# Patient Record
Sex: Male | Born: 1950 | Race: Black or African American | Hispanic: No | Marital: Single | Smoking: Current some day smoker
Health system: Southern US, Community
[De-identification: ages and names within clinical notes are randomized; demographics above are authoritative.]

## PROBLEM LIST (undated history)

## (undated) DIAGNOSIS — G4730 Sleep apnea, unspecified: Secondary | ICD-10-CM

## (undated) DIAGNOSIS — N189 Chronic kidney disease, unspecified: Secondary | ICD-10-CM

## (undated) DIAGNOSIS — K219 Gastro-esophageal reflux disease without esophagitis: Secondary | ICD-10-CM

## (undated) DIAGNOSIS — IMO0002 Reserved for concepts with insufficient information to code with codable children: Secondary | ICD-10-CM

## (undated) DIAGNOSIS — R0600 Dyspnea, unspecified: Secondary | ICD-10-CM

## (undated) DIAGNOSIS — M1990 Unspecified osteoarthritis, unspecified site: Secondary | ICD-10-CM

## (undated) DIAGNOSIS — D649 Anemia, unspecified: Secondary | ICD-10-CM

## (undated) DIAGNOSIS — E785 Hyperlipidemia, unspecified: Secondary | ICD-10-CM

## (undated) DIAGNOSIS — I10 Essential (primary) hypertension: Secondary | ICD-10-CM

## (undated) DIAGNOSIS — Z5189 Encounter for other specified aftercare: Secondary | ICD-10-CM

## (undated) HISTORY — DX: Essential (primary) hypertension: I10

## (undated) HISTORY — DX: Chronic kidney disease, unspecified: N18.9

## (undated) HISTORY — DX: Reserved for concepts with insufficient information to code with codable children: IMO0002

## (undated) HISTORY — DX: Gastro-esophageal reflux disease without esophagitis: K21.9

## (undated) HISTORY — PX: WISDOM TOOTH EXTRACTION: SHX21

## (undated) HISTORY — PX: TONSILLECTOMY: SUR1361

## (undated) HISTORY — DX: Encounter for other specified aftercare: Z51.89

## (undated) HISTORY — PX: COLONOSCOPY: SHX174

---

## 1972-01-01 HISTORY — PX: INGUINAL HERNIA REPAIR: SUR1180

## 1988-01-01 DIAGNOSIS — Z5189 Encounter for other specified aftercare: Secondary | ICD-10-CM

## 1988-01-01 DIAGNOSIS — IMO0001 Reserved for inherently not codable concepts without codable children: Secondary | ICD-10-CM

## 1988-01-01 DIAGNOSIS — IMO0002 Reserved for concepts with insufficient information to code with codable children: Secondary | ICD-10-CM

## 1988-01-01 HISTORY — DX: Reserved for inherently not codable concepts without codable children: IMO0001

## 1988-01-01 HISTORY — DX: Reserved for concepts with insufficient information to code with codable children: IMO0002

## 1988-01-01 HISTORY — DX: Encounter for other specified aftercare: Z51.89

## 2000-10-01 ENCOUNTER — Encounter: Admission: RE | Admit: 2000-10-01 | Discharge: 2000-10-01 | Payer: Self-pay | Admitting: Family Medicine

## 2000-10-01 ENCOUNTER — Encounter: Payer: Self-pay | Admitting: Family Medicine

## 2001-03-27 ENCOUNTER — Encounter: Payer: Self-pay | Admitting: Family Medicine

## 2001-03-27 ENCOUNTER — Ambulatory Visit (HOSPITAL_COMMUNITY): Admission: RE | Admit: 2001-03-27 | Discharge: 2001-03-27 | Payer: Self-pay | Admitting: Family Medicine

## 2001-06-19 ENCOUNTER — Encounter: Admission: RE | Admit: 2001-06-19 | Discharge: 2001-06-19 | Payer: Self-pay | Admitting: Family Medicine

## 2001-06-19 ENCOUNTER — Encounter: Payer: Self-pay | Admitting: Family Medicine

## 2003-03-11 ENCOUNTER — Encounter: Admission: RE | Admit: 2003-03-11 | Discharge: 2003-03-11 | Payer: Self-pay | Admitting: Family Medicine

## 2003-03-11 ENCOUNTER — Encounter: Payer: Self-pay | Admitting: Family Medicine

## 2004-09-05 ENCOUNTER — Emergency Department (HOSPITAL_COMMUNITY): Admission: EM | Admit: 2004-09-05 | Discharge: 2004-09-05 | Payer: Self-pay

## 2004-09-11 ENCOUNTER — Emergency Department (HOSPITAL_COMMUNITY): Admission: EM | Admit: 2004-09-11 | Discharge: 2004-09-11 | Payer: Self-pay | Admitting: Emergency Medicine

## 2006-05-29 ENCOUNTER — Ambulatory Visit: Payer: Self-pay | Admitting: Gastroenterology

## 2006-06-12 ENCOUNTER — Ambulatory Visit: Payer: Self-pay | Admitting: Gastroenterology

## 2006-06-12 ENCOUNTER — Encounter (INDEPENDENT_AMBULATORY_CARE_PROVIDER_SITE_OTHER): Payer: Self-pay | Admitting: Specialist

## 2009-02-18 ENCOUNTER — Encounter: Admission: RE | Admit: 2009-02-18 | Discharge: 2009-02-18 | Payer: Self-pay | Admitting: Family Medicine

## 2011-06-12 ENCOUNTER — Other Ambulatory Visit: Payer: Self-pay | Admitting: Family Medicine

## 2011-06-12 ENCOUNTER — Ambulatory Visit
Admission: RE | Admit: 2011-06-12 | Discharge: 2011-06-12 | Disposition: A | Discharge status: Home / Self Care | Payer: BC Managed Care – PPO | Source: Ambulatory Visit | Attending: Family Medicine | Admitting: Family Medicine

## 2011-06-12 DIAGNOSIS — J209 Acute bronchitis, unspecified: Secondary | ICD-10-CM

## 2011-07-16 ENCOUNTER — Encounter: Payer: Self-pay | Admitting: Gastroenterology

## 2011-08-17 ENCOUNTER — Ambulatory Visit (AMBULATORY_SURGERY_CENTER): Payer: BC Managed Care – PPO | Admitting: *Deleted

## 2011-08-17 ENCOUNTER — Encounter: Payer: Self-pay | Admitting: Gastroenterology

## 2011-08-17 DIAGNOSIS — Z1211 Encounter for screening for malignant neoplasm of colon: Secondary | ICD-10-CM

## 2011-08-17 DIAGNOSIS — Z86010 Personal history of colonic polyps: Secondary | ICD-10-CM

## 2011-08-17 MED ORDER — PEG-KCL-NACL-NASULF-NA ASC-C 100 G PO SOLR: ORAL | Status: DC

## 2011-08-31 ENCOUNTER — Ambulatory Visit (AMBULATORY_SURGERY_CENTER): Payer: BC Managed Care – PPO | Admitting: Gastroenterology

## 2011-08-31 ENCOUNTER — Encounter: Payer: Self-pay | Admitting: Gastroenterology

## 2011-08-31 DIAGNOSIS — Z86010 Personal history of colonic polyps: Secondary | ICD-10-CM

## 2011-08-31 DIAGNOSIS — Z1211 Encounter for screening for malignant neoplasm of colon: Secondary | ICD-10-CM

## 2011-08-31 MED ORDER — SODIUM CHLORIDE 0.9 % IV SOLN: 500.0000 mL | INTRAVENOUS | Status: DC

## 2011-09-04 ENCOUNTER — Telehealth: Payer: Self-pay | Admitting: *Deleted

## 2011-09-04 NOTE — Telephone Encounter (Signed)

## 2012-02-05 ENCOUNTER — Other Ambulatory Visit: Payer: Self-pay | Admitting: Family Medicine

## 2012-02-05 DIAGNOSIS — R252 Cramp and spasm: Secondary | ICD-10-CM

## 2012-02-08 ENCOUNTER — Ambulatory Visit
Admission: RE | Admit: 2012-02-08 | Discharge: 2012-02-08 | Disposition: A | Discharge status: Home / Self Care | Payer: BC Managed Care – PPO | Source: Ambulatory Visit | Attending: Family Medicine | Admitting: Family Medicine

## 2012-02-08 DIAGNOSIS — R252 Cramp and spasm: Secondary | ICD-10-CM

## 2014-08-05 ENCOUNTER — Encounter: Payer: Self-pay | Admitting: Gastroenterology

## 2018-07-11 ENCOUNTER — Other Ambulatory Visit: Payer: Self-pay | Admitting: Nephrology

## 2018-07-11 DIAGNOSIS — N183 Chronic kidney disease, stage 3 unspecified: Secondary | ICD-10-CM

## 2018-07-11 DIAGNOSIS — N179 Acute kidney failure, unspecified: Secondary | ICD-10-CM

## 2018-08-08 ENCOUNTER — Ambulatory Visit
Admission: RE | Admit: 2018-08-08 | Discharge: 2018-08-08 | Disposition: A | Discharge status: Home / Self Care | Payer: BC Managed Care – PPO | Source: Ambulatory Visit | Attending: Nephrology | Admitting: Nephrology

## 2018-08-08 DIAGNOSIS — N179 Acute kidney failure, unspecified: Secondary | ICD-10-CM

## 2018-08-08 DIAGNOSIS — N183 Chronic kidney disease, stage 3 unspecified: Secondary | ICD-10-CM

## 2018-12-01 ENCOUNTER — Encounter: Payer: Self-pay | Admitting: Nurse Practitioner

## 2018-12-01 ENCOUNTER — Ambulatory Visit (INDEPENDENT_AMBULATORY_CARE_PROVIDER_SITE_OTHER): Payer: BC Managed Care – PPO | Admitting: Nurse Practitioner

## 2018-12-01 VITALS — BP 138/100 | HR 87 | Temp 98.6°F | Ht 71.5 in | Wt 202.0 lb

## 2018-12-01 DIAGNOSIS — Z23 Encounter for immunization: Secondary | ICD-10-CM

## 2018-12-01 DIAGNOSIS — M25562 Pain in left knee: Secondary | ICD-10-CM

## 2018-12-01 DIAGNOSIS — Z139 Encounter for screening, unspecified: Secondary | ICD-10-CM

## 2018-12-01 DIAGNOSIS — N182 Chronic kidney disease, stage 2 (mild): Secondary | ICD-10-CM | POA: Diagnosis not present

## 2018-12-01 DIAGNOSIS — R351 Nocturia: Secondary | ICD-10-CM

## 2018-12-01 DIAGNOSIS — I129 Hypertensive chronic kidney disease with stage 1 through stage 4 chronic kidney disease, or unspecified chronic kidney disease: Secondary | ICD-10-CM | POA: Diagnosis not present

## 2018-12-01 DIAGNOSIS — I10 Essential (primary) hypertension: Secondary | ICD-10-CM

## 2018-12-01 DIAGNOSIS — Z0000 Encounter for general adult medical examination without abnormal findings: Secondary | ICD-10-CM

## 2018-12-01 LAB — POCT URINALYSIS DIPSTICK
Bilirubin, UA: NEGATIVE
Glucose, UA: NEGATIVE
Ketones, UA: NEGATIVE
Leukocytes, UA: NEGATIVE
Nitrite, UA: NEGATIVE
Protein, UA: POSITIVE — AB
Spec Grav, UA: 1.02 (ref 1.010–1.025)
Urobilinogen, UA: 0.2 E.U./dL
pH, UA: 6 (ref 5.0–8.0)

## 2018-12-01 LAB — POCT UA - MICROALBUMIN
Albumin/Creatinine Ratio, Urine, POC: 300
Creatinine, POC: 200 mg/dL
Microalbumin Ur, POC: 150 mg/L

## 2018-12-01 MED ORDER — TETANUS-DIPHTHERIA TOXOIDS TD 2-2 LF/0.5ML IM SUSP: 0.5000 mL | Freq: Once | INTRAMUSCULAR | 0 refills | Status: AC

## 2018-12-01 NOTE — Progress Notes (Signed)
Subjective:     Patient ID: Ryan Salazar , male    DOB: 1951/11/10 , 67 y.o.   MRN: [id number]   Chief Complaint  Patient presents with  . Annual Exam    Men's preventive visit. Patient Health Questionnaire (PHQ-2) is    Office Visit from 12/01/2018 in [facility]  PHQ-2 Total Score  0    . Patient is on a regular diet. Marital status: Married. Relevant history for alcohol use is:  Social History   Substance and Sexual Activity  Alcohol Use Yes   Comment: "DRINKS ALCOHOL & SMOKE POT ON WEEKENDS"  . Relevant history for tobacco use is:  Social History   Tobacco Use  Smoking Status Current Some Day Smoker  . Types: Cigarettes  Smokeless Tobacco Never Used  Tobacco Comment   1 PACK  EVERY 3 DAYS  .  HPI  Here for physical and medicare annual wellness exam  Knee Pain   The incident occurred more than 1 week ago. The incident occurred at home. The injury mechanism is unknown. The pain is present in the left knee. The quality of the pain is described as stabbing. The pain is at a severity of 8/10. The pain is moderate. The pain has been intermittent since onset. Pertinent negatives include no numbness or tingling. Nothing aggravates the symptoms. Treatments tried: knee brace.     Past Medical History:  Diagnosis Date  . Blood transfusion 1989  . GERD (gastroesophageal reflux disease)   . Hypertension   . Ulcer 1989   BLEEDING STOMACH ULCER     Family History  Problem Relation Age of Onset  . Colon cancer Neg Hx   . Colon polyps Neg Hx      Current Outpatient Medications:  .  amLODipine (NORVASC) 5 MG tablet, Take 1 tablet by mouth Daily. , Disp: , Rfl:  .  calcitRIOL (ROCALTROL) 0.25 MCG capsule, Take 0.25 mcg by mouth daily., Disp: , Rfl:  .  doxazosin (CARDURA) 4 MG tablet, Take 4 mg by mouth 2 (two) times daily., Disp: , Rfl:  .  furosemide (LASIX) 80 MG tablet, Take 1 tablet by mouth 2 (two) times daily. , Disp: , Rfl:  .  KLOR-CON M20  20 MEQ tablet, Take 1 tablet by mouth Daily., Disp: , Rfl:  .  labetalol (NORMODYNE) 300 MG tablet, Take 1 tablet by mouth Twice daily., Disp: , Rfl:  .  Multiple Vitamin (MULTIVITAMIN) tablet, Take 1 tablet by mouth daily.  , Disp: , Rfl:  .  omeprazole (PRILOSEC) 20 MG capsule, Take 1 tablet by mouth Twice daily., Disp: , Rfl:    No Known Allergies   Review of Systems  Constitutional: Negative.   HENT: Negative.   Eyes: Negative.   Respiratory: Negative.   Cardiovascular: Negative.   Gastrointestinal: Negative.   Endocrine: Negative.   Genitourinary: Negative.   Musculoskeletal: Negative.        Left knee pain   Skin: Negative.   Allergic/Immunologic: Negative.   Neurological: Negative.  Negative for tingling and numbness.  Hematological: Negative.   Psychiatric/Behavioral: Negative.      Today's Vitals   12/01/18 1453  BP: (!) 138/100  Pulse: 87  Temp: 98.6 F (37 C)  TempSrc: Oral  SpO2: 98%  Weight: 202 lb (91.6 kg)  Height: 5' 11.5" (1.816 m)  PainSc: 0-No pain   Body mass index is 27.78 kg/m.   Objective:  Physical Exam  Constitutional: He is oriented to person,  place, and time. He appears well-developed and well-nourished.  HENT:  Head: Normocephalic and atraumatic.  Right Ear: External ear normal.  Left Ear: External ear normal.  Nose: Nose normal.  Mouth/Throat: Oropharynx is clear and moist.  Eyes: Pupils are equal, round, and reactive to light. Conjunctivae and EOM are normal.  Neck: Normal range of motion. Neck supple.  Cardiovascular: Normal rate, regular rhythm, normal heart sounds and intact distal pulses.  Pulmonary/Chest: Effort normal and breath sounds normal.  Abdominal: Bowel sounds are normal.  Musculoskeletal: Normal range of motion.  Neurological: He is alert and oriented to person, place, and time.  Skin: Skin is warm and dry. Capillary refill takes less than 2 seconds.  Psychiatric: He has a normal mood and affect.  Vitals  reviewed.       Assessment And Plan:     1. Medicare annual wellness visit, initial . Behavior modifications discussed and diet history reviewed.   . Pt will continue to exercise regularly and modify diet with low GI, plant based foods and decrease intake of processed foods.  . Recommend intake of daily multivitamin, Vitamin D, and calcium.  . Recommend mammogram and colonoscopy for preventive screenings, as well as recommend immunizations that include influenza, TDAP, and Shingles  2. Essential hypertension . B/P is poorly controlled, has had a recent change to his medications by Cardiology.  . CMP ordered to check renal function.  . The importance of regular exercise and dietary modification was stressed to the patient.  - CBC no Diff - CMP14 + Anion Gap - Lipid panel - POCT Urinalysis Dipstick (81002) - POCT UA - Microalbumin  3. Acute pain of left knee  Mild tenderness to left knee medial aspect  Negative drawer test  Will check knee xray - DG Knee Complete 4 Views Right; Future  4. Stage 2 chronic kidney disease  Being followed by Nephrology  5. Encounter for screening  Will check for Hepatitis C screening due to being born between the years [birth year]-1965 - Hepatitis C antibody  6. Nocturia  Will check PSA level today, negative manual prostate exam - PSA  7. Encounter for immunization - Pneumococcal 23 - diptheria-tetanus toxoids (DECAVAC) 2-2 LF/0.5ML injection; Inject 0.5 mLs into the muscle once for 1 dose.  Dispense: 0.5 mL; Refill: 0    [name], FNP    Subjective:   Ryan Salazar is a 67 y.o. male who presents for a Welcome to Medicare exam.   Review of Systems: See above Cardiac Risk Factors include: advanced age (>65men, >40 women)     Objective:    Today's Vitals   12/01/18 1453  BP: (!) 138/100  Pulse: 87  Temp: 98.6 F (37 C)  TempSrc: Oral  SpO2: 98%  Weight: 202 lb (91.6 kg)  Height: 5' 11.5" (1.816 m)  PainSc: 0-No pain    Body mass index is 27.78 kg/m.  Medications Outpatient Encounter Medications as of 12/01/2018  Medication Sig  . amLODipine (NORVASC) 5 MG tablet Take 1 tablet by mouth Daily.   . calcitRIOL (ROCALTROL) 0.25 MCG capsule Take 0.25 mcg by mouth daily.  Marland Kitchen doxazosin (CARDURA) 4 MG tablet Take 4 mg by mouth 2 (two) times daily.  . furosemide (LASIX) 80 MG tablet Take 1 tablet by mouth 2 (two) times daily.   Marland Kitchen KLOR-CON M20 20 MEQ tablet Take 1 tablet by mouth Daily.  Marland Kitchen labetalol (NORMODYNE) 300 MG tablet Take 1 tablet by mouth Twice daily.  . Multiple Vitamin (MULTIVITAMIN) tablet  Take 1 tablet by mouth daily.    Marland Kitchen omeprazole (PRILOSEC) 20 MG capsule Take 1 tablet by mouth Twice daily.  . [DISCONTINUED] enalapril (VASOTEC) 10 MG tablet Take 1 tablet by mouth Twice daily.  . [DISCONTINUED] Tamsulosin HCl (FLOMAX) 0.4 MG CAPS Take 1 tablet by mouth Daily.   No facility-administered encounter medications on file as of 12/01/2018.      History: Past Medical History:  Diagnosis Date  . Blood transfusion 1989  . GERD (gastroesophageal reflux disease)   . Hypertension   . Ulcer 1989   BLEEDING STOMACH ULCER   Past Surgical History:  Procedure Laterality Date  . [place]  . TONSILLECTOMY  AS CHILD    Family History  Problem Relation Age of Onset  . Colon cancer Neg Hx   . Colon polyps Neg Hx    Social History   Occupational History  . Not on file  Tobacco Use  . Smoking status: Current Some Day Smoker    Types: Cigarettes  . Smokeless tobacco: Never Used  . Tobacco comment: 1 PACK  EVERY 3 DAYS  Substance and Sexual Activity  . Alcohol use: Yes    Comment: "DRINKS ALCOHOL & SMOKE POT ON WEEKENDS"  . Drug use: Yes    Types: Marijuana  . Sexual activity: Not on file   Tobacco Counseling Ready to quit: Not Answered Counseling given: Not Answered Comment: 1 PACK  EVERY 3 DAYS   Immunizations and Health Maintenance Immunization History  Administered  Date(s) Administered  . Influenza-Unspecified 09/01/2018   Health Maintenance Due  Topic Date Due  . Hepatitis C Screening  Dec 25, 1951  . TETANUS/TDAP  12/07/1970  . PNA vac Low Risk Adult (1 of 2 - PCV13) 12/07/2016  . INFLUENZA VACCINE  07/31/2018    Activities of Daily Living In your present state of health, do you have any difficulty performing the following activities: 12/01/2018  Hearing? N  Vision? Y  Difficulty concentrating or making decisions? N  Walking or climbing stairs? N  Dressing or bathing? N  Doing errands, shopping? N  Preparing Food and eating ? N  Using the Toilet? N  In the past six months, have you accidently leaked urine? N  Do you have problems with loss of bowel control? N  Managing your Medications? N  Managing your Finances? N  Housekeeping or managing your Housekeeping? N  Some recent data might be hidden    Physical Exam  See above, (optional), or other factors deemed appropriate based on the beneficiary's medical and social history and current clinical standards.  Advanced Directives: Does Patient Have a Medical Advance Directive?: No Would patient like information on creating a medical advance directive?: Yes (MAU/Ambulatory/Procedural Areas - Information given)    Assessment:    This is a routine wellness  examination for this patient Ryan Salazar.  Vision/Hearing screen No exam data present  Dietary issues and exercise activities discussed:  Current Exercise Habits: Home exercise routine, Time (Minutes): 30, Frequency (Times/Week): 3, Weekly Exercise (Minutes/Week): 90, Intensity: Moderate, Exercise limited by: None identified  Goals    . Exercise 3x per week (30 min per time)     Would like to exercise about 5 days a week.        Depression Screen PHQ 2/9 Scores 12/01/2018  PHQ - 2 Score 0     Fall Risk Fall Risk  12/01/2018  Falls in the past year? 1  Number falls in past yr: 0  Injury  with Fall? 0    Cognitive Function          Patient Care Team: [name], FNP as PCP - General (General Practice)     Plan:    See above  I have personally reviewed and noted the following in the patient's chart:   . Medical and social history . Use of alcohol, tobacco or illicit drugs  . Current medications and supplements . Functional ability and status . Nutritional status . Physical activity . Advanced directives . List of other physicians . Hospitalizations, surgeries, and ER visits in previous 12 months . Vitals . Screenings to include cognitive, depression, and falls . Referrals and appointments  In addition, I have reviewed and discussed with patient certain preventive protocols, quality metrics, and best practice recommendations. A written personalized care plan for preventive services as well as general preventive health recommendations were provided to patient.    [name], FNP 12/01/2018

## 2018-12-01 NOTE — Patient Instructions (Signed)
Advance Directive Advance directives are legal documents that let you make choices ahead of time about your health care and medical treatment in case you become unable to communicate for yourself. Advance directives are a way for you to communicate your wishes to family, friends, and health care providers. This can help convey your decisions about end-of-life care if you become unable to communicate. Discussing and writing advance directives should happen over time rather than all at once. Advance directives can be changed depending on your situation and what you want, even after you have signed the advance directives. If you do not have an advance directive, some states assign family decision makers to act on your behalf based on how closely you are related to them. Each state has its own laws regarding advance directives. You may want to check with your health care provider, attorney, or state representative about the laws in your state. There are different types of advance directives, such as:  Medical power of attorney.  Living will.  Do not resuscitate (DNR) or do not attempt resuscitation (DNAR) order.  Health care proxy and medical power of attorney A health care proxy, also called a health care agent, is a person who is appointed to make medical decisions for you in cases in which you are unable to make the decisions yourself. Generally, people choose someone they know well and trust to represent their preferences. Make sure to ask this person for an agreement to act as your proxy. A proxy may have to exercise judgment in the event of a medical decision for which your wishes are not known. A medical power of attorney is a legal document that names your health care proxy. Depending on the laws in your state, after the document is written, it may also need to be:  Signed.  Notarized.  Dated.  Copied.  Witnessed.  Incorporated into your medical record.  You may also want to appoint  someone to manage your financial affairs in a situation in which you are unable to do so. This is called a durable power of attorney for finances. It is a separate legal document from the durable power of attorney for health care. You may choose the same person or someone different from your health care proxy to act as your agent in financial matters. If you do not appoint a proxy, or if there is a concern that the proxy is not acting in your best interests, a court-appointed guardian may be designated to act on your behalf. Living will A living will is a set of instructions documenting your wishes about medical care when you cannot express them yourself. Health care providers should keep a copy of your living will in your medical record. You may want to give a copy to family members or friends. To alert caregivers in case of an emergency, you can place a card in your wallet to let them know that you have a living will and where they can find it. A living will is used if you become:  Terminally ill.  Incapacitated.  Unable to communicate or make decisions.  Items to consider in your living will include:  The use or non-use of life-sustaining equipment, such as dialysis machines and breathing machines (ventilators).  A DNR or DNAR order, which is the instruction not to use cardiopulmonary resuscitation (CPR) if breathing or heartbeat stops.  The use or non-use of tube feeding.  Withholding of food and fluids.  Comfort (palliative) care when the goal becomes  comfort rather than a cure.  Organ and tissue donation.  A living will does not give instructions for distributing your money and property if you should pass away. It is recommended that you seek the advice of a lawyer when writing a will. Decisions about taxes, beneficiaries, and asset distribution will be legally binding. This process can relieve your family and friends of any concerns surrounding disputes or questions that may come up  about the distribution of your assets. DNR or DNAR A DNR or DNAR order is a request not to have CPR in the event that your heart stops beating or you stop breathing. If a DNR or DNAR order has not been made and shared, a health care provider will try to help any patient whose heart has stopped or who has stopped breathing. If you plan to have surgery, talk with your health care provider about how your DNR or DNAR order will be followed if problems occur. Summary  Advance directives are the legal documents that allow you to make choices ahead of time about your health care and medical treatment in case you become unable to communicate for yourself.  The process of discussing and writing advance directives should happen over time. You can change the advance directives, even after you have signed them.  Advance directives include DNR or DNAR orders, living wills, and designating an agent as your medical power of attorney. This information is not intended to replace advice given to you by your health care provider. Make sure you discuss any questions you have with your health care provider. Document Released: 03/25/2008 Document Revised: 11/05/2016 Document Reviewed: 11/05/2016 Elsevier Interactive Patient Education  2017 [employer].  Ryan Salazar , Thank you for taking time to come for your Medicare Wellness Visit. I appreciate your ongoing commitment to your health goals. Please review the following plan we discussed and let me know if I can assist you in the future.   These are the goals we discussed: Goals    . Exercise 3x per week (30 min per time)     Would like to exercise about 5 days a week.        This is a list of the screening recommended for you and due dates:  Health Maintenance  Topic Date Due  . Tetanus Vaccine  12/07/1970  . Pneumonia vaccines (1 of 2 - PCV13) 12/07/2016  . Colon Cancer Screening  08/30/2021  . Flu Shot  Completed  .  Hepatitis C: One time screening is  recommended by Center for Disease Control  (CDC) for  adults born from [birth year] through 1965.   Completed

## 2018-12-07 LAB — CMP14 + ANION GAP
ALT: 21 IU/L (ref 0–44)
AST: 27 IU/L (ref 0–40)
Albumin/Globulin Ratio: 2.2 (ref 1.2–2.2)
Albumin: 4.4 g/dL (ref 3.6–4.8)
Alkaline Phosphatase: 54 IU/L (ref 39–117)
Anion Gap: 15 mmol/L (ref 10.0–18.0)
BUN/Creatinine Ratio: 9 — ABNORMAL LOW (ref 10–24)
BUN: 27 mg/dL (ref 8–27)
Bilirubin Total: 0.6 mg/dL (ref 0.0–1.2)
CO2: 26 mmol/L (ref 20–29)
Calcium: 9.4 mg/dL (ref 8.6–10.2)
Chloride: 102 mmol/L (ref 96–106)
Creatinine, Ser: 3.04 mg/dL — ABNORMAL HIGH (ref 0.76–1.27)
GFR calc Af Amer: 24 mL/min/{1.73_m2} — ABNORMAL LOW (ref >59)
GFR calc non Af Amer: 20 mL/min/{1.73_m2} — ABNORMAL LOW (ref >59)
Globulin, Total: 2 g/dL (ref 1.5–4.5)
Glucose: 93 mg/dL (ref 65–99)
Potassium: 3.9 mmol/L (ref 3.5–5.2)
Sodium: 143 mmol/L (ref 134–144)
Total Protein: 6.4 g/dL (ref 6.0–8.5)

## 2018-12-07 LAB — LIPID PANEL
Chol/HDL Ratio: 4.5 ratio (ref 0.0–5.0)
Cholesterol, Total: 159 mg/dL (ref 100–199)
HDL: 35 mg/dL — ABNORMAL LOW (ref >39)
LDL Calculated: 84 mg/dL (ref 0–99)
Triglycerides: 201 mg/dL — ABNORMAL HIGH (ref 0–149)
VLDL Cholesterol Cal: 40 mg/dL (ref 5–40)

## 2018-12-07 LAB — CBC
Hematocrit: 42.3 % (ref 37.5–51.0)
Hemoglobin: 13.5 g/dL (ref 13.0–17.7)
MCH: 27.3 pg (ref 26.6–33.0)
MCHC: 31.9 g/dL (ref 31.5–35.7)
MCV: 86 fL (ref 79–97)
Platelets: 191 10*3/uL (ref 150–450)
RBC: 4.95 x10E6/uL (ref 4.14–5.80)
RDW: 13 % (ref 12.3–15.4)
WBC: 6.6 10*3/uL (ref 3.4–10.8)

## 2018-12-07 LAB — PSA: Prostate Specific Ag, Serum: 3.7 ng/mL (ref 0.0–4.0)

## 2018-12-07 LAB — HEPATITIS C ANTIBODY: Hep C Virus Ab: <0.1 s/co ratio (ref 0.0–0.9)

## 2018-12-08 ENCOUNTER — Inpatient Hospital Stay: Admission: RE | Admit: 2018-12-08 | Payer: BC Managed Care – PPO | Source: Ambulatory Visit

## 2018-12-28 DIAGNOSIS — M25562 Pain in left knee: Secondary | ICD-10-CM | POA: Insufficient documentation

## 2018-12-28 DIAGNOSIS — I10 Essential (primary) hypertension: Secondary | ICD-10-CM | POA: Insufficient documentation

## 2018-12-28 DIAGNOSIS — N182 Chronic kidney disease, stage 2 (mild): Secondary | ICD-10-CM | POA: Insufficient documentation

## 2018-12-28 DIAGNOSIS — R351 Nocturia: Secondary | ICD-10-CM | POA: Insufficient documentation

## 2019-04-14 ENCOUNTER — Other Ambulatory Visit: Payer: Self-pay | Admitting: Nurse Practitioner

## 2019-06-03 ENCOUNTER — Other Ambulatory Visit: Payer: Self-pay

## 2019-06-03 ENCOUNTER — Encounter: Payer: Self-pay | Admitting: Nurse Practitioner

## 2019-06-03 ENCOUNTER — Ambulatory Visit (INDEPENDENT_AMBULATORY_CARE_PROVIDER_SITE_OTHER): Payer: BC Managed Care – PPO | Admitting: Nurse Practitioner

## 2019-06-03 VITALS — BP 131/73 | HR 53 | Temp 97.9°F | Ht 65.0 in

## 2019-06-03 DIAGNOSIS — I129 Hypertensive chronic kidney disease with stage 1 through stage 4 chronic kidney disease, or unspecified chronic kidney disease: Secondary | ICD-10-CM

## 2019-06-03 DIAGNOSIS — N182 Chronic kidney disease, stage 2 (mild): Secondary | ICD-10-CM | POA: Diagnosis not present

## 2019-06-03 DIAGNOSIS — M79641 Pain in right hand: Secondary | ICD-10-CM | POA: Diagnosis not present

## 2019-06-03 DIAGNOSIS — I10 Essential (primary) hypertension: Secondary | ICD-10-CM

## 2019-06-03 HISTORY — DX: Pain in right hand: M79.641

## 2019-06-03 NOTE — Progress Notes (Signed)
Virtual Visit via video    This visit type was conducted due to national recommendations for restrictions regarding the COVID-19 Pandemic (e.g. social distancing) in an effort to limit this patient's exposure and mitigate transmission in our community.  Patients identity confirmed using two different identifiers.  This format is felt to be most appropriate for this patient at this time.  All issues noted in this document were discussed and addressed.  No physical exam was performed (except for noted visual exam findings with Video Visits).    Date:  06/07/2019   ID:  [name], DOB 10/20/1951, MRN [id number]  Patient Location:  Home - spoke with [name]  Provider location:   Office    Chief Complaint:    History of Present Illness:    Ryan Salazar is a 68 y.o. male who presents via video conferencing for a telehealth visit today.    The patient does not have symptoms concerning for COVID-19 infection (fever, chills, cough, or new shortness of breath).   Dr. Rolm Gala - seeing her every 4 months.  Hypertension  This is a chronic problem. The current episode started more than 1 year ago. The problem is unchanged. The problem is controlled. Pertinent negatives include no anxiety or chest pain. There are no associated agents to hypertension. There are no known risk factors for coronary artery disease. Past treatments include calcium channel blockers. The current treatment provides moderate improvement. There are no compliance problems.  There is no history of angina. Identifiable causes of hypertension include chronic renal disease.  Hand Pain   The incident occurred more than 1 week ago. There was no injury mechanism. Pain location: left hand pain at fatty pad of thumb. The quality of the pain is described as shooting. The pain does not radiate. Pertinent negatives include no chest pain or tingling. Nothing aggravates the symptoms. He has tried nothing for the symptoms. The treatment  provided mild relief.     Past Medical History:  Diagnosis Date  . Blood transfusion 1989  . GERD (gastroesophageal reflux disease)   . Hypertension   . Ulcer 1989   BLEEDING STOMACH ULCER   Past Surgical History:  Procedure Laterality Date  . [place]  . TONSILLECTOMY  AS CHILD     Current Meds  Medication Sig  . amLODipine (NORVASC) 10 MG tablet Take 1 tablet by mouth Daily.   . calcitRIOL (ROCALTROL) 0.25 MCG capsule Take 0.25 mcg by mouth daily.  Marland Kitchen doxazosin (CARDURA) 4 MG tablet Take 4 mg by mouth 2 (two) times daily.  . furosemide (LASIX) 80 MG tablet Take 1 tablet by mouth 2 (two) times daily.   Marland Kitchen KLOR-CON M20 20 MEQ tablet Take 1 tablet by mouth Daily.  Marland Kitchen labetalol (NORMODYNE) 300 MG tablet TAKE 1 TABLET BY MOUTH TWICE A DAY  . Multiple Vitamin (MULTIVITAMIN) tablet Take 1 tablet by mouth daily.    Marland Kitchen omeprazole (PRILOSEC) 20 MG capsule Take 1 tablet by mouth Twice daily.     Allergies:   Patient has no known allergies.   Social History   Tobacco Use  . Smoking status: Current Some Day Smoker    Types: Cigarettes  . Smokeless tobacco: Never Used  . Tobacco comment: 1 PACK  EVERY 3 DAYS  Substance Use Topics  . Alcohol use: Yes    Comment: "DRINKS ALCOHOL & SMOKE POT ON WEEKENDS"  . Drug use: Yes    Types: Marijuana  Family Hx: The patient's family history is negative for Colon cancer and Colon polyps.  ROS:   Please see the history of present illness.    Review of Systems  Constitutional: Negative.   Respiratory: Negative.   Cardiovascular: Negative.  Negative for chest pain.  Musculoskeletal: Negative.        Intermittent right thumb pain. Occasional knee pain  Neurological: Negative for dizziness and tingling.    All other systems reviewed and are negative.   Labs/Other Tests and Data Reviewed:    Recent Labs: 12/05/2018: ALT 21; BUN 27; Creatinine, Ser 3.04; Hemoglobin 13.5; Platelets 191; Potassium 3.9; Sodium 143    Recent Lipid Panel Lab Results  Component Value Date/Time   CHOL 161 06/04/2019 11:44 AM   TRIG 157 (H) 06/04/2019 11:44 AM   HDL 35 (L) 06/04/2019 11:44 AM   CHOLHDL 4.6 06/04/2019 11:44 AM   LDLCALC 95 06/04/2019 11:44 AM    Wt Readings from Last 3 Encounters:  12/01/18 202 lb (91.6 kg)  08/31/11 207 lb (93.9 kg)  08/17/11 207 lb 12.8 oz (94.3 kg)     Exam:    Vital Signs:  BP 131/73 (BP Location: Left Arm, Patient Position: Sitting, Cuff Size: Large)   Pulse (!) 53   Temp 97.9 F (36.6 C) (Oral)   Ht 5\' 5"  (1.651 m)   BMI 33.61 kg/m     Physical Exam  Constitutional: He is oriented to person, place, and time and well-developed, well-nourished, and in no distress. No distress.  Pulmonary/Chest: Effort normal.  Musculoskeletal: Normal range of motion.        General: No tenderness, deformity or edema.  Neurological: He is alert and oriented to person, place, and time.  Psychiatric: Mood, memory, affect and judgment normal.    ASSESSMENT & PLAN:    1. Essential hypertension . B/P is controlled.  . CMP ordered to check renal function.  . The importance of regular exercise and dietary modification was stressed to the patient.  - Lipid panel  2. Stage 2 chronic kidney disease  Being followed by Nephrology  3. Right hand pain  Intermittent pain to right hand  No obvious swelling  Can take tylenol as needed for pain if not better will consider xray   COVID-19 Education: The signs and symptoms of COVID-19 were discussed with the patient and how to seek care for testing (follow up with PCP or arrange E-visit).  The importance of social distancing was discussed today.  Patient Risk:   After full review of this patients clinical status, I feel that they are at least moderate risk at this time.  Time:   Today, I have spent 16 minutes/ seconds with the patient with telehealth technology discussing above diagnoses.     Medication Adjustments/Labs and Tests  Ordered: Current medicines are reviewed at length with the patient today.  Concerns regarding medicines are outlined above.   Tests Ordered: Orders Placed This Encounter  Procedures  . Lipid panel    Medication Changes: No orders of the defined types were placed in this encounter.   Disposition:  Follow up in 6 month(s)  Signed, [name], FNP

## 2019-06-04 ENCOUNTER — Other Ambulatory Visit: Payer: Self-pay

## 2019-06-04 ENCOUNTER — Other Ambulatory Visit: Payer: BC Managed Care – PPO

## 2019-06-05 LAB — LIPID PANEL
Chol/HDL Ratio: 4.6 ratio (ref 0.0–5.0)
Cholesterol, Total: 161 mg/dL (ref 100–199)
HDL: 35 mg/dL — ABNORMAL LOW (ref >39)
LDL Calculated: 95 mg/dL (ref 0–99)
Triglycerides: 157 mg/dL — ABNORMAL HIGH (ref 0–149)
VLDL Cholesterol Cal: 31 mg/dL (ref 5–40)

## 2019-06-11 ENCOUNTER — Other Ambulatory Visit: Payer: Self-pay

## 2019-06-11 ENCOUNTER — Ambulatory Visit: Payer: BC Managed Care – PPO | Admitting: Nurse Practitioner

## 2019-06-11 ENCOUNTER — Encounter: Payer: Self-pay | Admitting: Nurse Practitioner

## 2019-06-11 VITALS — BP 140/90 | HR 55 | Temp 98.1°F | Ht 70.6 in | Wt 206.8 lb

## 2019-06-11 DIAGNOSIS — H6692 Otitis media, unspecified, left ear: Secondary | ICD-10-CM | POA: Diagnosis not present

## 2019-06-11 DIAGNOSIS — H6690 Otitis media, unspecified, unspecified ear: Secondary | ICD-10-CM | POA: Insufficient documentation

## 2019-06-11 DIAGNOSIS — H6121 Impacted cerumen, right ear: Secondary | ICD-10-CM | POA: Diagnosis not present

## 2019-06-11 MED ORDER — CIPRODEX 0.3-0.1 % OT SUSP: 4.0000 [drp] | Freq: Two times a day (BID) | OTIC | 0 refills | Status: DC

## 2019-06-11 NOTE — Patient Instructions (Signed)

## 2019-06-11 NOTE — Progress Notes (Signed)
Subjective:     Patient ID: Ryan Salazar , male    DOB: [birth year] , 68 y.o.   MRN: [id number]   Chief Complaint  Patient presents with  . Otalgia    patient states his ear has been hurting him and feels itchy and within the past few days the pain has gotten worse even eating causes him pain    HPI  Otalgia  There is pain in the left ear. This is a new problem. The current episode started in the past 7 days. The problem occurs constantly. The problem has been unchanged. There has been no fever. The patient is experiencing no pain. Pertinent negatives include no diarrhea or headaches. Associated symptoms comments: Painful chewing. Treatments tried: used ear drops  The treatment provided no relief. His past medical history is significant for a chronic ear infection. There is no history of hearing loss.     Past Medical History:  Diagnosis Date  . Blood transfusion 1989  . GERD (gastroesophageal reflux disease)   . Hypertension   . Ulcer 1989   BLEEDING STOMACH ULCER     Family History  Problem Relation Age of Onset  . Colon cancer Neg Hx   . Colon polyps Neg Hx      Current Outpatient Medications:  .  amLODipine (NORVASC) 10 MG tablet, Take 1 tablet by mouth Daily. , Disp: , Rfl:  .  calcitRIOL (ROCALTROL) 0.25 MCG capsule, Take 0.25 mcg by mouth daily., Disp: , Rfl:  .  doxazosin (CARDURA) 4 MG tablet, Take 4 mg by mouth 2 (two) times daily., Disp: , Rfl:  .  furosemide (LASIX) 80 MG tablet, Take 1 tablet by mouth 2 (two) times daily. , Disp: , Rfl:  .  KLOR-CON M20 20 MEQ tablet, Take 1 tablet by mouth Daily., Disp: , Rfl:  .  labetalol (NORMODYNE) 300 MG tablet, TAKE 1 TABLET BY MOUTH TWICE A DAY, Disp: 180 tablet, Rfl: 1 .  Multiple Vitamin (MULTIVITAMIN) tablet, Take 1 tablet by mouth daily.  , Disp: , Rfl:  .  omeprazole (PRILOSEC) 20 MG capsule, Take 1 tablet by mouth Twice daily., Disp: , Rfl:    No Known Allergies   Review of Systems  Constitutional: Negative.    HENT: Positive for ear pain. Negative for congestion.   Eyes: Negative.   Respiratory: Negative.   Cardiovascular: Negative for chest pain, palpitations and leg swelling.  Gastrointestinal: Negative for diarrhea.  Neurological: Negative for dizziness and headaches.     Today's Vitals   06/11/19 1421  BP: 140/90  Pulse: (!) 55  Temp: 98.1 F (36.7 C)  TempSrc: Oral  Weight: 206 lb 12.8 oz (93.8 kg)  Height: 5' 10.6" (1.793 m)  PainSc: 2   PainLoc: Ear   Body mass index is 29.17 kg/m.   Objective:  Physical Exam Constitutional:      Appearance: Normal appearance.  HENT:     Right Ear: Tympanic membrane normal. There is impacted cerumen.     Left Ear: External ear normal. There is no impacted cerumen.  Cardiovascular:     Rate and Rhythm: Normal rate and regular rhythm.     Pulses: Normal pulses.     Heart sounds: Normal heart sounds. No murmur.  Pulmonary:     Effort: Pulmonary effort is normal.     Breath sounds: Normal breath sounds.  Skin:    General: Skin is warm and dry.     Capillary Refill: Capillary refill takes less than  2 seconds.  Neurological:     General: No focal deficit present.     Mental Status: He is alert and oriented to person, place, and time.  Psychiatric:        Mood and Affect: Mood normal.        Behavior: Behavior normal.        Thought Content: Thought content normal.        Judgment: Judgment normal.         Assessment And Plan:     1. Acute otitis media, unspecified otitis media type  Left ear with inflammation noted   If not better return call to office may need to refer to ENT - ciprofloxacin-dexamethasone (CIPRODEX) OTIC suspension; Place 4 drops into the left ear 2 (two) times daily.  Dispense: 7.5 mL; Refill: 0  2. Excessive cerumen in right ear canal  Removed hard cerumen from right canal with lighted currette.  [name], FNP    THE PATIENT IS ENCOURAGED TO PRACTICE SOCIAL DISTANCING DUE TO THE COVID-19  PANDEMIC.

## 2019-07-01 DIAGNOSIS — I639 Cerebral infarction, unspecified: Secondary | ICD-10-CM

## 2019-07-01 HISTORY — DX: Cerebral infarction, unspecified: I63.9

## 2019-07-15 ENCOUNTER — Telehealth: Payer: Self-pay
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
## 2019-07-16 ENCOUNTER — Ambulatory Visit (HOSPITAL_COMMUNITY)
Admission: EM | Admit: 2019-07-16 | Discharge: 2019-07-16 | Disposition: A | Discharge status: Home / Self Care | Payer: BC Managed Care – PPO | Attending: Internal Medicine | Admitting: Internal Medicine

## 2019-07-16 ENCOUNTER — Encounter (HOSPITAL_COMMUNITY): Payer: Self-pay | Admitting: Emergency Medicine

## 2019-07-16 ENCOUNTER — Ambulatory Visit (INDEPENDENT_AMBULATORY_CARE_PROVIDER_SITE_OTHER): Payer: BC Managed Care – PPO

## 2019-07-16 ENCOUNTER — Other Ambulatory Visit: Payer: Self-pay

## 2019-07-16 DIAGNOSIS — M25512 Pain in left shoulder: Secondary | ICD-10-CM

## 2019-07-16 MED ORDER — ACETAMINOPHEN 500 MG PO TABS: 500.0000 mg | ORAL_TABLET | Freq: Four times a day (QID) | ORAL | 0 refills | Status: DC | PRN
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
## 2019-07-20 ENCOUNTER — Encounter (HOSPITAL_COMMUNITY): Payer: Self-pay

## 2019-07-20 ENCOUNTER — Ambulatory Visit (HOSPITAL_COMMUNITY)
Admission: EM | Admit: 2019-07-20 | Discharge: 2019-07-20 | Disposition: A | Discharge status: Home / Self Care | Payer: BC Managed Care – PPO | Attending: Family Medicine | Admitting: Family Medicine

## 2019-07-20 ENCOUNTER — Inpatient Hospital Stay (HOSPITAL_COMMUNITY)
Admission: EM | Admit: 2019-07-20 | Discharge: 2019-07-23 | DRG: 065 | Disposition: A | Discharge status: Home / Self Care | Payer: BC Managed Care – PPO | Attending: Internal Medicine | Admitting: Internal Medicine

## 2019-07-20 ENCOUNTER — Emergency Department (HOSPITAL_COMMUNITY): Payer: BC Managed Care – PPO

## 2019-07-20 ENCOUNTER — Other Ambulatory Visit: Payer: Self-pay

## 2019-07-20 DIAGNOSIS — K219 Gastro-esophageal reflux disease without esophagitis: Secondary | ICD-10-CM | POA: Diagnosis present

## 2019-07-20 DIAGNOSIS — I129 Hypertensive chronic kidney disease with stage 1 through stage 4 chronic kidney disease, or unspecified chronic kidney disease: Secondary | ICD-10-CM | POA: Diagnosis present

## 2019-07-20 DIAGNOSIS — R202 Paresthesia of skin: Secondary | ICD-10-CM

## 2019-07-20 DIAGNOSIS — Z1159 Encounter for screening for other viral diseases: Secondary | ICD-10-CM

## 2019-07-20 DIAGNOSIS — I639 Cerebral infarction, unspecified: Principal | ICD-10-CM

## 2019-07-20 DIAGNOSIS — I10 Essential (primary) hypertension: Secondary | ICD-10-CM

## 2019-07-20 DIAGNOSIS — Z823 Family history of stroke: Secondary | ICD-10-CM

## 2019-07-20 DIAGNOSIS — N179 Acute kidney failure, unspecified: Secondary | ICD-10-CM

## 2019-07-20 DIAGNOSIS — I7389 Other specified peripheral vascular diseases: Secondary | ICD-10-CM | POA: Diagnosis present

## 2019-07-20 DIAGNOSIS — Z79899 Other long term (current) drug therapy: Secondary | ICD-10-CM

## 2019-07-20 DIAGNOSIS — N184 Chronic kidney disease, stage 4 (severe): Secondary | ICD-10-CM | POA: Diagnosis present

## 2019-07-20 DIAGNOSIS — F17210 Nicotine dependence, cigarettes, uncomplicated: Secondary | ICD-10-CM | POA: Diagnosis present

## 2019-07-20 DIAGNOSIS — E785 Hyperlipidemia, unspecified: Secondary | ICD-10-CM | POA: Diagnosis present

## 2019-07-20 DIAGNOSIS — Z8711 Personal history of peptic ulcer disease: Secondary | ICD-10-CM

## 2019-07-20 DIAGNOSIS — R200 Anesthesia of skin: Secondary | ICD-10-CM | POA: Diagnosis not present

## 2019-07-20 DIAGNOSIS — G8194 Hemiplegia, unspecified affecting left nondominant side: Secondary | ICD-10-CM | POA: Diagnosis present

## 2019-07-20 DIAGNOSIS — R29701 NIHSS score 1: Secondary | ICD-10-CM | POA: Diagnosis present

## 2019-07-20 LAB — DIFFERENTIAL
Abs Immature Granulocytes: 0.03 10*3/uL (ref 0.00–0.07)
Basophils Absolute: 0.1 10*3/uL (ref 0.0–0.1)
Basophils Relative: 1 %
Eosinophils Absolute: 0.3 10*3/uL (ref 0.0–0.5)
Eosinophils Relative: 4 %
Immature Granulocytes: 0 %
Lymphocytes Relative: 25 %
Lymphs Abs: 1.9 10*3/uL (ref 0.7–4.0)
Monocytes Absolute: 0.8 10*3/uL (ref 0.1–1.0)
Monocytes Relative: 10 %
Neutro Abs: 4.6 10*3/uL (ref 1.7–7.7)
Neutrophils Relative %: 60 %

## 2019-07-20 LAB — COMPREHENSIVE METABOLIC PANEL
ALT: 21 U/L (ref 0–44)
AST: 23 U/L (ref 15–41)
Albumin: 3.9 g/dL (ref 3.5–5.0)
Alkaline Phosphatase: 48 U/L (ref 38–126)
Anion gap: 11 (ref 5–15)
BUN: 33 mg/dL — ABNORMAL HIGH (ref 8–23)
CO2: 24 mmol/L (ref 22–32)
Calcium: 9.1 mg/dL (ref 8.9–10.3)
Chloride: 106 mmol/L (ref 98–111)
Creatinine, Ser: 3.8 mg/dL — ABNORMAL HIGH (ref 0.61–1.24)
GFR calc Af Amer: 18 mL/min — ABNORMAL LOW (ref >60)
GFR calc non Af Amer: 15 mL/min — ABNORMAL LOW (ref >60)
Glucose, Bld: 88 mg/dL (ref 70–99)
Potassium: 3.7 mmol/L (ref 3.5–5.1)
Sodium: 141 mmol/L (ref 135–145)
Total Bilirubin: 0.8 mg/dL (ref 0.3–1.2)
Total Protein: 6.7 g/dL (ref 6.5–8.1)

## 2019-07-20 LAB — CBC
HCT: 43.8 % (ref 39.0–52.0)
Hemoglobin: 13.5 g/dL (ref 13.0–17.0)
MCH: 27 pg (ref 26.0–34.0)
MCHC: 30.8 g/dL (ref 30.0–36.0)
MCV: 87.6 fL (ref 80.0–100.0)
Platelets: 192 10*3/uL (ref 150–400)
RBC: 5 MIL/uL (ref 4.22–5.81)
RDW: 14 % (ref 11.5–15.5)
WBC: 7.6 10*3/uL (ref 4.0–10.5)
nRBC: 0 % (ref 0.0–0.2)

## 2019-07-20 LAB — APTT: aPTT: 33 seconds (ref 24–36)

## 2019-07-20 LAB — PROTIME-INR
INR: 1.1 (ref 0.8–1.2)
Prothrombin Time: 13.9 seconds (ref 11.4–15.2)

## 2019-07-20 MED ORDER — SODIUM CHLORIDE 0.9% FLUSH: 3.0000 mL | Freq: Once | INTRAVENOUS | Status: DC

## 2019-07-20 NOTE — Discharge Instructions (Signed)
Please go to the ER for further evaluation of your numbness and tingling of your arm and leg after your fall.
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
## 2019-07-20 NOTE — ED Notes (Signed)
Patient is being discharged from the Urgent [facility] and sent to the Emergency Department. Per Lanelle Bal, NP, patient is stable but in need of higher level of care due to unilateral numbness. Patient is aware and verbalizes understanding of plan of care.  Vitals:   07/20/19 1659  BP: (!) 174/101  Pulse: (!) 52  Resp: 17  Temp: 97.7 F (36.5 C)  SpO2: 99%
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
## 2019-07-21 ENCOUNTER — Inpatient Hospital Stay (HOSPITAL_COMMUNITY): Payer: BC Managed Care – PPO

## 2019-07-21 ENCOUNTER — Emergency Department (HOSPITAL_COMMUNITY): Payer: BC Managed Care – PPO

## 2019-07-21 DIAGNOSIS — Z8711 Personal history of peptic ulcer disease: Secondary | ICD-10-CM | POA: Diagnosis not present

## 2019-07-21 DIAGNOSIS — Z79899 Other long term (current) drug therapy: Secondary | ICD-10-CM | POA: Diagnosis not present

## 2019-07-21 DIAGNOSIS — F17210 Nicotine dependence, cigarettes, uncomplicated: Secondary | ICD-10-CM | POA: Diagnosis present

## 2019-07-21 DIAGNOSIS — N184 Chronic kidney disease, stage 4 (severe): Secondary | ICD-10-CM | POA: Diagnosis present

## 2019-07-21 DIAGNOSIS — K219 Gastro-esophageal reflux disease without esophagitis: Secondary | ICD-10-CM | POA: Diagnosis present

## 2019-07-21 DIAGNOSIS — I129 Hypertensive chronic kidney disease with stage 1 through stage 4 chronic kidney disease, or unspecified chronic kidney disease: Secondary | ICD-10-CM | POA: Diagnosis present

## 2019-07-21 DIAGNOSIS — Z1159 Encounter for screening for other viral diseases: Secondary | ICD-10-CM | POA: Diagnosis not present

## 2019-07-21 DIAGNOSIS — I639 Cerebral infarction, unspecified: Secondary | ICD-10-CM

## 2019-07-21 DIAGNOSIS — R200 Anesthesia of skin: Secondary | ICD-10-CM | POA: Diagnosis present

## 2019-07-21 DIAGNOSIS — R29701 NIHSS score 1: Secondary | ICD-10-CM | POA: Diagnosis present

## 2019-07-21 DIAGNOSIS — G8194 Hemiplegia, unspecified affecting left nondominant side: Secondary | ICD-10-CM | POA: Diagnosis present

## 2019-07-21 DIAGNOSIS — I371 Nonrheumatic pulmonary valve insufficiency: Secondary | ICD-10-CM | POA: Diagnosis not present

## 2019-07-21 DIAGNOSIS — I7389 Other specified peripheral vascular diseases: Secondary | ICD-10-CM | POA: Diagnosis present

## 2019-07-21 DIAGNOSIS — E785 Hyperlipidemia, unspecified: Secondary | ICD-10-CM | POA: Diagnosis present

## 2019-07-21 DIAGNOSIS — I10 Essential (primary) hypertension: Secondary | ICD-10-CM

## 2019-07-21 DIAGNOSIS — Z823 Family history of stroke: Secondary | ICD-10-CM | POA: Diagnosis not present

## 2019-07-21 LAB — LIPID PANEL
Cholesterol: 161 mg/dL (ref 0–200)
HDL: 29 mg/dL — ABNORMAL LOW (ref >40)
LDL Cholesterol: 95 mg/dL (ref 0–99)
Total CHOL/HDL Ratio: 5.6 RATIO
Triglycerides: 186 mg/dL — ABNORMAL HIGH (ref <150)
VLDL: 37 mg/dL (ref 0–40)

## 2019-07-21 LAB — ECHOCARDIOGRAM COMPLETE

## 2019-07-21 LAB — SARS CORONAVIRUS 2 BY RT PCR (HOSPITAL ORDER, PERFORMED IN ~~LOC~~ HOSPITAL LAB): SARS Coronavirus 2: NEGATIVE

## 2019-07-21 LAB — HEMOGLOBIN A1C
Hgb A1c MFr Bld: 6.1 % — ABNORMAL HIGH (ref 4.8–5.6)
Mean Plasma Glucose: 128.37 mg/dL

## 2019-07-21 LAB — HIV ANTIBODY (ROUTINE TESTING W REFLEX): HIV Screen 4th Generation wRfx: NONREACTIVE

## 2019-07-21 MED ORDER — DOXAZOSIN MESYLATE 4 MG PO TABS
4.0000 mg | ORAL_TABLET | Freq: Every day | ORAL | Status: DC
  Filled 2019-07-21: qty 1

## 2019-07-21 MED ORDER — LABETALOL HCL 200 MG PO TABS
300.0000 mg | ORAL_TABLET | Freq: Once | ORAL | Status: AC
  Administered 2019-07-21: 300 mg via ORAL
  Filled 2019-07-21: qty 2

## 2019-07-21 MED ORDER — ACETAMINOPHEN 160 MG/5ML PO SOLN: 650.0000 mg | ORAL | Status: DC | PRN

## 2019-07-21 MED ORDER — AMLODIPINE BESYLATE 5 MG PO TABS
10.0000 mg | ORAL_TABLET | Freq: Once | ORAL | Status: AC
  Administered 2019-07-21: 10 mg via ORAL
  Filled 2019-07-21: qty 2

## 2019-07-21 MED ORDER — ACETAMINOPHEN 325 MG PO TABS: 650.0000 mg | ORAL_TABLET | ORAL | Status: DC | PRN

## 2019-07-21 MED ORDER — ASPIRIN 325 MG PO TABS
325.0000 mg | ORAL_TABLET | Freq: Every day | ORAL | Status: DC
  Administered 2019-07-21: 325 mg via ORAL
  Filled 2019-07-21: qty 1

## 2019-07-21 MED ORDER — ATORVASTATIN CALCIUM 40 MG PO TABS
40.0000 mg | ORAL_TABLET | Freq: Every day | ORAL | Status: DC
  Administered 2019-07-21 – 2019-07-22 (×2): 40 mg via ORAL
  Filled 2019-07-21 (×2): qty 1

## 2019-07-21 MED ORDER — LABETALOL HCL 5 MG/ML IV SOLN
10.0000 mg | INTRAVENOUS | Status: DC | PRN
  Administered 2019-07-21 – 2019-07-22 (×2): 10 mg via INTRAVENOUS
  Filled 2019-07-21 (×2): qty 4

## 2019-07-21 MED ORDER — ENOXAPARIN SODIUM 30 MG/0.3ML ~~LOC~~ SOLN
30.0000 mg | Freq: Every day | SUBCUTANEOUS | Status: DC
  Administered 2019-07-21 – 2019-07-22 (×2): 30 mg via SUBCUTANEOUS
  Filled 2019-07-21 (×3): qty 0.3

## 2019-07-21 MED ORDER — ASPIRIN 300 MG RE SUPP: 300.0000 mg | Freq: Every day | RECTAL | Status: DC

## 2019-07-21 MED ORDER — STROKE: EARLY STAGES OF RECOVERY BOOK
Freq: Once | Status: AC
  Administered 2019-07-21: 06:00:00
  Filled 2019-07-21: qty 1

## 2019-07-21 MED ORDER — ASPIRIN EC 81 MG PO TBEC
81.0000 mg | DELAYED_RELEASE_TABLET | Freq: Every day | ORAL | Status: DC
  Administered 2019-07-22 – 2019-07-23 (×2): 81 mg via ORAL
  Filled 2019-07-21 (×2): qty 1

## 2019-07-21 MED ORDER — FUROSEMIDE 20 MG PO TABS
80.0000 mg | ORAL_TABLET | Freq: Once | ORAL | Status: AC
  Administered 2019-07-21: 80 mg via ORAL
  Filled 2019-07-21: qty 4

## 2019-07-21 MED ORDER — ACETAMINOPHEN 650 MG RE SUPP: 650.0000 mg | RECTAL | Status: DC | PRN

## 2019-07-21 NOTE — Progress Notes (Signed)
pt's heart rate has been in the low 40's today. Bradycardia

## 2019-07-21 NOTE — ED Notes (Addendum)
Attempted report 

## 2019-07-21 NOTE — Progress Notes (Signed)
Carotid duplex and TCD have been completed.   Preliminary results in CV Proc.   Abram Sander 07/21/2019 11:13 AM

## 2019-07-21 NOTE — Evaluation (Signed)
Speech Language Pathology Evaluation Patient Details Name: Ryan Salazar MRN: [id number] DOB: Sep 23, 1951 Today's Date: 07/21/2019 Time: 9242-6834 SLP Time Calculation (min) (ACUTE ONLY): 25 min  Problem List:  Patient Active Problem List   Diagnosis Date Noted  . Acute ischemic stroke ([place]) 07/21/2019  . CKD (chronic kidney disease) stage 4, GFR 15-29 ml/min (HCC) 07/21/2019  . Acute otitis media 06/11/2019  . Excessive cerumen in right ear canal 06/11/2019  . Right hand pain 06/03/2019  . Essential hypertension 12/28/2018  . Acute pain of left knee 12/28/2018  . Stage 2 chronic kidney disease 12/28/2018  . Nocturia 12/28/2018   Past Medical History:  Past Medical History:  Diagnosis Date  . Blood transfusion 1989  . GERD (gastroesophageal reflux disease)   . Hypertension   . Ulcer 1989   BLEEDING STOMACH ULCER   Past Surgical History:  Past Surgical History:  Procedure Laterality Date  . [place]  . TONSILLECTOMY  AS CHILD   HPI:  Pt is a 68 y.o. male with medical history significant of HTN who presented to the ED with c/o L-sided numbness that he woke up with two days prior on 7/19, L face, arm, and leg numbness. MRI of the brain showed small foci of acute ischemia within the dorsal right pons and along the medial aspect of the right basal ganglia.    Assessment / Plan / Recommendation Clinical Impression  Pt reported that he works full-time as a Scientist, clinical (histocompatibility and immunogenetics) at [employer] and has a Conservator, museum/gallery in public adminstration. He denied any baseline or new deficits in speech, language or cognition and stated that he is currently his mother's caregiver. The [facility] Cognitive Assessment 8.1 was completed to evaluate the pt's cognitive-linguistic skills. He achieved a score of 26/30 which is within the normal limits of 26 or more out of 30 and no speech/language deficits were demonstrated. Further skilled SLP services are not clinically indicated at  this time. Pt and nursing were educated regarding this and both parties verbalized understanding as well as agreement with plan of care.    SLP Assessment  SLP Recommendation/Assessment: Patient does not need any further Speech Lanaguage Pathology Services SLP Visit Diagnosis: Cognitive communication deficit (R41.841)    Follow Up Recommendations  None    Frequency and Duration           SLP Evaluation Cognition  Overall Cognitive Status: Within Functional Limits for tasks assessed Arousal/Alertness: Awake/alert Orientation Level: Oriented X4 Attention: Focused;Sustained Focused Attention: Appears intact(Vigilance WNL: 1/1) Sustained Attention: Appears intact(Serial 7s: 3/3) Memory: Appears intact(Immediate: 5/5; delayed: 5/5) Awareness: Appears intact Problem Solving: Appears intact Executive Function: Reasoning;Sequencing;Organizing Reasoning: Impaired Reasoning Impairment: Verbal complex(Abstraction: 0/2) Sequencing: Appears intact(Clock drawing: 3/3) Organizing: Appears intact(Backward digit span: 1/1)       Comprehension  Auditory Comprehension Overall Auditory Comprehension: Appears within functional limits for tasks assessed Yes/No Questions: Within Functional Limits Commands: Within Functional Limits Complex Commands: (Trail completion: 1/1) Conversation: Diplomatic Services operational officer Discrimination: Within Function Limits Reading Comprehension Reading Status: Within funtional limits    Expression Expression Primary Mode of Expression: Verbal Verbal Expression Overall Verbal Expression: Appears within functional limits for tasks assessed Initiation: No impairment Repetition: No impairment(2/2) Confrontation: Within functional limits(3/3) Divergent: (0/1) Written Expression Dominant Hand: Right Written Expression: (Difficulty copying cube: 0/1)   Oral / Motor  Oral Motor/Sensory Function Overall Oral Motor/Sensory Function: Within functional  limits Motor Speech Overall Motor Speech: Appears within functional limits for tasks assessed Respiration: Within  functional limits Phonation: Normal Resonance: Within functional limits Articulation: Within functional limitis Intelligibility: Intelligible Motor Planning: Witnin functional limits Motor Speech Errors: Not applicable   [place]. Hardin Negus, [place], [place] Office number [phone] Pager [place] 07/21/2019, 11:15 AM

## 2019-07-21 NOTE — Progress Notes (Addendum)
Patient is a 68 year old male with history of hypertension, CKD stage 4 who presented to the emergency department complaints of left-sided numbness for last 2 days.  MRI showed small punctuate acute ischemic infarct in the right pons and medial aspect of the right basal ganglia. Stroke work-up initiated.  Neurology consulted. Hemoglobin A1c of 6.1.  LDL of 95.  Started on Lipitor Undergoing echocardiogram today. Patient seen by PT/OT and recommended outpatient follow-up. Patient seen and examined at the bedside this morning.  He is hemodynamically stable.  Comfortable.  He still have some numbness on the left side but he does not have any focal neurological deficits.  He is alert and oriented.  Speech is clear.   Plan for discharge tomorrow morning after full work-up completed. Patient seen by Dr. Alcario Drought this morning
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
## 2019-07-21 NOTE — Progress Notes (Signed)
  Echocardiogram 2D Echocardiogram has been performed.  Ryan Salazar 07/21/2019, 11:57 AM

## 2019-07-21 NOTE — Consult Note (Signed)
Neurology Consultation Reason for Consult: Left-sided numbness Referring Physician: Ward, K  CC: Left-sided numbness  History is obtained from: Patient  HPI: Ryan Salazar is a 68 y.o. male who was in his normal state of health when he went to bed Saturday night, on Sunday morning when he awoke, there was numbness on his left side.  He states that it is been a static deficit since that time.  He has not noticed a lot of weakness, but rather mostly numbness.  When this denied get better, he presented to the ER for further evaluation where an MRI was performed demonstrating a small pontine infarct.  It does look like there is some old strokes on the MRI as well, but these were not something he was aware of prior to this hospitalization.   LKW: Saturday night tpa given?: no, outside of window   ROS: A 14 point ROS was performed and is negative except as noted in the HPI.   Past Medical History:  Diagnosis Date  . Blood transfusion 1989  . GERD (gastroesophageal reflux disease)   . Hypertension   . Ulcer 1989   BLEEDING STOMACH ULCER     Family History  Problem Relation Age of Onset  . Stroke Mother   . Healthy Father   . Colon cancer Neg Hx   . Colon polyps Neg Hx      Social History:  reports that he has been smoking cigarettes. He has never used smokeless tobacco. He reports current alcohol use. He reports current drug use. Drug: Marijuana.   Exam: Current vital signs: BP (!) 173/94 (BP Location: Right Arm)   Pulse (!) 55   Temp 99.3 F (37.4 C) (Oral)   Resp 18   SpO2 96%  Vital signs in last 24 hours: Temp:  [97.6 F (36.4 C)-99.3 F (37.4 C)] 99.3 F (37.4 C) (07/21 0552) Pulse Rate:  [49-66] 55 (07/21 0552) Resp:  [12-25] 18 (07/21 0552) BP: (156-185)/(94-111) 173/94 (07/21 0552) SpO2:  [96 %-100 %] 96 % (07/21 0552)   Physical Exam  Constitutional: Appears well-developed and well-nourished.  Psych: Affect appropriate to situation Eyes: No scleral  injection HENT: No OP obstrucion Head: Normocephalic.  Cardiovascular: Normal rate and regular rhythm.  Respiratory: Effort normal, non-labored breathing GI: Soft.  No distension. There is no tenderness.  Skin: WDI  Neuro: Mental Status: Patient is awake, alert, oriented to person, place, month, year, and situation. Patient is able to give a clear and coherent history. No signs of aphasia or neglect Cranial Nerves: II: Visual Fields are full. Pupils are equal, round, and reactive to light.   III,IV, VI: EOMI without ptosis or diploplia.  V: Facial sensation is symmetric to temperature VII: Facial movement is symmetric.  VIII: hearing is intact to voice X: Uvula elevates symmetrically XI: Shoulder shrug is symmetric. XII: tongue is midline without atrophy or fasciculations.  Motor: Tone is normal. Bulk is normal. 5/5 strength was present in bilateral hands, he has pain in his left shoulder, but is able to hold his left arm without drift. Sensory: Sensation is diminished in the left arm and leg Cerebellar: He has difficulty with finger-nose-finger on the left due to pain in the shoulder.  I have reviewed labs in epic and the results pertinent to this consultation are: Creatinine 3.8  I have reviewed the images obtained: MRI brain-small pontine infarct as well as older appearing basal ganglia infarcts, all with small vessel appearance  Impression: 68 year old male with small vessel  infarct.  He will need secondary risk factor modification including blood pressure control, assessing for diabetes, cholesterol control.  I advised him that smoking increases risk of stroke and advised cessation.  Recommendations: - HgbA1c, fasting lipid panel - MRI, MRA  of the brain without contrast - Frequent neuro checks - Echocardiogram - Carotid dopplers - Prophylactic therapy-Antiplatelet med: Aspirin - dose 81mg  - Risk factor modification - Telemetry monitoring - PT consult, OT consult,  Speech consult - Stroke team to follow   Roland Rack, MD [facility] [phone]  If 7pm- 7am, please page neurology on call as listed in [place].

## 2019-07-21 NOTE — Progress Notes (Signed)
Pt in procedure, next  q2 vital will be checked at 12 noon
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
## 2019-07-22 MED ORDER — LABETALOL HCL 100 MG PO TABS
300.0000 mg | ORAL_TABLET | Freq: Two times a day (BID) | ORAL | Status: DC
  Administered 2019-07-22 – 2019-07-23 (×3): 300 mg via ORAL
  Filled 2019-07-22 (×3): qty 3

## 2019-07-22 MED ORDER — AMLODIPINE BESYLATE 10 MG PO TABS
10.0000 mg | ORAL_TABLET | Freq: Every day | ORAL | Status: DC
  Administered 2019-07-22 – 2019-07-23 (×2): 10 mg via ORAL
  Filled 2019-07-22 (×2): qty 1

## 2019-07-22 MED ORDER — DOXAZOSIN MESYLATE 4 MG PO TABS
4.0000 mg | ORAL_TABLET | Freq: Two times a day (BID) | ORAL | Status: DC
  Administered 2019-07-22 – 2019-07-23 (×2): 4 mg via ORAL
  Filled 2019-07-22 (×5): qty 1

## 2019-07-22 MED ORDER — FUROSEMIDE 80 MG PO TABS
80.0000 mg | ORAL_TABLET | Freq: Two times a day (BID) | ORAL | Status: DC
  Administered 2019-07-22 – 2019-07-23 (×3): 80 mg via ORAL
  Filled 2019-07-22 (×3): qty 1

## 2019-07-22 MED ORDER — CALCITRIOL 0.25 MCG PO CAPS
0.2500 ug | ORAL_CAPSULE | Freq: Every day | ORAL | Status: DC
  Administered 2019-07-22 – 2019-07-23 (×2): 0.25 ug via ORAL
  Filled 2019-07-22 (×2): qty 1
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
## 2019-07-22 NOTE — TOC Initial Note (Signed)
Transition of Care [facility]) - Initial/Assessment Note    Patient Details  Name: Ryan Salazar MRN: [id number] Date of Birth: April 28, 1951  Transition of Care [facility]) CM/SW Contact:    Carles Collet, RN Phone Number: 07/22/2019, 3:16 PM  Clinical Narrative:             Damaris Schooner w patient at bedside, discussed PT recs for OP PT OT. Patient states he is interested in outpatient services. He would like referral placed in Epic.This has been done.        Expected Discharge Plan: OP Rehab Barriers to Discharge: Continued Medical Work up   Patient Goals and CMS Choice Patient states their goals for this hospitalization and ongoing recovery are:: to return home      Expected Discharge Plan and Services Expected Discharge Plan: OP Rehab                                              Prior Living Arrangements/Services                       Activities of Daily Living Home Assistive Devices/Equipment: None ADL Screening (condition at time of admission) Patient's cognitive ability adequate to safely complete daily activities?: Yes Is the patient deaf or have difficulty hearing?: No Does the patient have difficulty seeing, even when wearing glasses/contacts?: No Does the patient have difficulty concentrating, remembering, or making decisions?: No Patient able to express need for assistance with ADLs?: No Does the patient have difficulty dressing or bathing?: No Independently performs ADLs?: Yes (appropriate for developmental age) Does the patient have difficulty walking or climbing stairs?: Yes Weakness of Legs: Left Weakness of Arms/Hands: Left  Permission Sought/Granted                  Emotional Assessment              Admission diagnosis:  Stroke [facility]) [I63.9] Acute ischemic stroke ([place]) [I63.9] Essential hypertension [I10] Acute renal failure superimposed on chronic kidney disease, unspecified CKD stage, unspecified acute renal failure type ([place]) [N17.9,  N18.9] Patient Active Problem List   Diagnosis Date Noted  . Acute ischemic stroke ([place]) 07/21/2019  . CKD (chronic kidney disease) stage 4, GFR 15-29 ml/min (HCC) 07/21/2019  . Acute otitis media 06/11/2019  . Excessive cerumen in right ear canal 06/11/2019  . Right hand pain 06/03/2019  . Essential hypertension 12/28/2018  . Acute pain of left knee 12/28/2018  . Stage 2 chronic kidney disease 12/28/2018  . Nocturia 12/28/2018   PCP:  [name], [place] Pharmacy:   [id number] - [place], [address] [place] [address] Phone: [phone] Fax: [phone]     Social Determinants of Health (SDOH) Interventions    Readmission Risk Interventions No flowsheet data found.
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
## 2019-07-23 MED ORDER — ASPIRIN 81 MG PO TBEC: 81.0000 mg | DELAYED_RELEASE_TABLET | Freq: Every day | ORAL | 1 refills | Status: AC

## 2019-07-23 MED ORDER — ATORVASTATIN CALCIUM 40 MG PO TABS: 40.0000 mg | ORAL_TABLET | Freq: Every day | ORAL | 1 refills | Status: DC

## 2019-07-23 MED ORDER — CLOPIDOGREL BISULFATE 75 MG PO TABS: 75.0000 mg | ORAL_TABLET | Freq: Every day | ORAL | 0 refills | Status: AC

## 2019-07-23 NOTE — Progress Notes (Signed)
STROKE TEAM PROGRESS NOTE   INTERVAL HISTORY The patient is neurologically stable and has no complaints.  Patient was able to tolerate only 3 hours of CPAP and did not need the minimum requirement of 4 hours hence was a screen failure in the sleep smart trial Vitals:   07/22/19 2319 07/23/19 0353 07/23/19 0737 07/23/19 1211  BP: (!) 165/107 (!) 175/105 (!) 163/106 (!) 161/106  Pulse: 64 60 (!) 54 70  Resp: 15 15 17 18   Temp: 98.2 F (36.8 C) 97.8 F (36.6 C) 98.1 F (36.7 C) 98.5 F (36.9 C)  TempSrc: Oral Oral Oral Oral  SpO2: 93% 97% 100% 97%    CBC:  Recent Labs  Lab 07/20/19 1751  WBC 7.6  NEUTROABS 4.6  HGB 13.5  HCT 43.8  MCV 87.6  PLT 852    Basic Metabolic Panel:  Recent Labs  Lab 07/20/19 1751  NA 141  K 3.7  CL 106  CO2 24  GLUCOSE 88  BUN 33*  CREATININE 3.80*  CALCIUM 9.1   Lipid Panel:     Component Value Date/Time   CHOL 161 07/21/2019 0436   CHOL 161 06/04/2019 1144   TRIG 186 (H) 07/21/2019 0436   HDL 29 (L) 07/21/2019 0436   HDL 35 (L) 06/04/2019 1144   CHOLHDL 5.6 07/21/2019 0436   VLDL 37 07/21/2019 0436   LDLCALC 95 07/21/2019 0436   LDLCALC 95 06/04/2019 1144   HgbA1c:  Lab Results  Component Value Date   HGBA1C 6.1 (H) 07/21/2019   Urine Drug Screen: No results found for: LABOPIA, COCAINSCRNUR, LABBENZ, AMPHETMU, THCU, LABBARB  Alcohol Level No results found for: ETH  IMAGING No results found. 2D Echocardiogram  1. The left ventricle has normal systolic function, with an ejection fraction of 55-60%. The cavity size was normal. There is moderately increased left ventricular wall thickness. Left ventricular diastolic Doppler parameters are consistent with  pseudonormalization. No evidence of left ventricular regional wall motion abnormalities.  2. The right ventricle has normal systolic function. The cavity was normal. There is no increase in right ventricular wall thickness. Right ventricular systolic pressure is normal.  3.  Left atrial size was mildly dilated.  4. The aortic valve is tricuspid. Mild sclerosis of the aortic valve.  5. The aortic root and ascending aorta are normal in size and structure.  PHYSICAL EXAM Pleasant middle-aged African-American male not in distress. . Afebrile. Head is nontraumatic. Neck is supple without bruit.    Cardiac exam no murmur or gallop. Lungs are clear to auscultation. Distal pulses are well felt. Neurological Exam ;  Awake  Alert oriented x 3. Normal speech and language.eye movements full without nystagmus.fundi were not visualized. Vision acuity and fields appear normal. Hearing is normal. Palatal movements are normal. Face symmetric. Tongue midline. Normal strength, tone, reflexes and coordination. Diminished left hemibody sensation. Gait deferred.   ASSESSMENT/PLAN [name] is a 68 y.o. male with history of HTN, GERD, bleeding ulcer requiring transfusion presenting with L sided numbness since Sunday.   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 Chronic SVD. No hmg.  TCD low normal mean flow velocities, poor windows  Carotid Doppler  B ICA 1-39% stenosis, VAs antegrade   2D Echo EF 55-60%. No source of embolus   LDL 95  HgbA1c 6.1  Lovenox 30 mg sq  daily for VTE prophylaxis  No antithrombotic prior to admission, now on aspirin 81 mg daily. Given mild stroke, recommend aspirin 81 mg and plavix 75 mg daily x 3 weeks, then aspirin alone. Orders adjusted.   Therapy recommendations:  OP PT and OT  Disposition:  Anticipate return home  Hypertension  Stable but on the high end . Permissive hypertension (OK if < 220/120) but gradually normalize in 5-7 days . Long-term BP goal normotensive  Hyperlipidemia  Home meds:  No statin  Now on lipitor 40  LDL 95, goal < 70  Continue statin at discharge  Other Stroke Risk  Factors  Advanced age  Cigarette smoker, advised to stop smoking  ETOH use, advised to drink no more than 2 drink(s) a day  Hx marijuana useFamily hx stroke (mother)  Other Active Problems  CKD stae IV  Hospital day # 2  Continue aspirin and Plavix for 3 weeks followed by aspirin alone and aggressive risk factor modification.  Patient was counseled to quit smoking.  He was a screen failure due to not being able to tolerate CPAP device for sleep smart study. discussed with patient and Dr Tawanna Solo.  Stroke team will sign off.  Follow-up as outpatient with stroke clinic in 6 weeks. Antony Contras, MD Medical Director [facility] Stroke Center Pager: [phone] 07/23/2019 3:23 PM   To contact Stroke Continuity provider, please refer to [email or web address] After hours, contact General Neurology
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
## 2019-07-27 ENCOUNTER — Other Ambulatory Visit: Payer: Self-pay | Admitting: Nurse Practitioner

## 2019-07-27 ENCOUNTER — Telehealth: Payer: Self-pay

## 2019-07-27 NOTE — Telephone Encounter (Signed)
Patient called stating he just got out of the hospital and was told to f/u with his doctor.  I HAVE RETURNED PT AND LEFT HIM A V/M SO WE CAN SCHEDULE HIM AN APPOINTMENT AND ALSO DO HIS TCM CALL. YRL,RMA

## 2019-07-28 ENCOUNTER — Telehealth: Payer: Self-pay

## 2019-07-28 NOTE — Telephone Encounter (Signed)
Transition Care Management Follow-up Telephone Call  Date of discharge and from where: [facility] 07/23/19  How have you been since you were released from the hospital? He is ok  Any questions or concerns? no  Items Reviewed:  Did the pt receive and understand the discharge instructions provided? yes  Medications obtained and verified? yes  Any new allergies since your discharge? no  Dietary orders reviewed? no  Do you have support at home? no  Other (ie: DME, Home Health, etc) no  Functional Questionnaire: (I = Independent and D = Dependent) ADL's: I  Bathing/Dressing- I   Meal Prep- I  Eating- I  Maintaining continence- I  Transferring/Ambulation- I  Managing Meds- I   Follow up appointments reviewed:    PCP Hospital f/u appt confirmed? Yes Scheduled to see [name] FNP-BC on 08/04/19 @ 3:00pm  Specialist Hospital f/u appt confirmed? NO  Are transportation arrangements needed? NO  If their condition worsens, is the pt aware to call  their PCP or go to the ED? YES  Was the patient provided with contact information for the PCP's office or ED? YES  Was the pt encouraged to call back with questions or concerns? YES

## 2019-08-04 ENCOUNTER — Encounter: Payer: Self-pay | Admitting: Nurse Practitioner

## 2019-08-04 ENCOUNTER — Other Ambulatory Visit: Payer: Self-pay

## 2019-08-04 ENCOUNTER — Ambulatory Visit (INDEPENDENT_AMBULATORY_CARE_PROVIDER_SITE_OTHER): Payer: BC Managed Care – PPO | Admitting: Nurse Practitioner

## 2019-08-04 VITALS — BP 136/76 | HR 62 | Wt 206.0 lb

## 2019-08-04 DIAGNOSIS — Z09 Encounter for follow-up examination after completed treatment for conditions other than malignant neoplasm: Secondary | ICD-10-CM

## 2019-08-04 DIAGNOSIS — I639 Cerebral infarction, unspecified: Secondary | ICD-10-CM

## 2019-08-04 DIAGNOSIS — Z720 Tobacco use: Secondary | ICD-10-CM | POA: Diagnosis not present

## 2019-08-04 DIAGNOSIS — I69339 Monoplegia of upper limb following cerebral infarction affecting unspecified side: Secondary | ICD-10-CM

## 2019-08-04 DIAGNOSIS — R29898 Other symptoms and signs involving the musculoskeletal system: Secondary | ICD-10-CM | POA: Insufficient documentation

## 2019-08-04 MED ORDER — CHANTIX STARTING MONTH PAK 0.5 MG X 11 & 1 MG X 42 PO TABS: ORAL_TABLET | ORAL | 0 refills | Status: DC
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
## 2019-09-03 ENCOUNTER — Ambulatory Visit: Payer: Medicare Other | Admitting: Adult Health

## 2019-09-03 ENCOUNTER — Other Ambulatory Visit: Payer: Self-pay

## 2019-09-03 ENCOUNTER — Encounter: Payer: Self-pay | Admitting: Adult Health

## 2019-09-03 VITALS — BP 154/108 | HR 76 | Temp 97.8°F | Ht 70.5 in | Wt 209.0 lb

## 2019-09-03 DIAGNOSIS — R29818 Other symptoms and signs involving the nervous system: Secondary | ICD-10-CM

## 2019-09-03 DIAGNOSIS — E785 Hyperlipidemia, unspecified: Secondary | ICD-10-CM | POA: Diagnosis not present

## 2019-09-03 DIAGNOSIS — I639 Cerebral infarction, unspecified: Secondary | ICD-10-CM | POA: Diagnosis not present

## 2019-09-03 DIAGNOSIS — Z720 Tobacco use: Secondary | ICD-10-CM | POA: Diagnosis not present

## 2019-09-03 DIAGNOSIS — I10 Essential (primary) hypertension: Secondary | ICD-10-CM

## 2019-09-03 MED ORDER — ATORVASTATIN CALCIUM 40 MG PO TABS: 40.0000 mg | ORAL_TABLET | Freq: Every day | ORAL | 1 refills | Status: DC
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
## 2019-09-07 ENCOUNTER — Encounter: Payer: Self-pay | Admitting: Adult Health

## 2019-09-08 ENCOUNTER — Other Ambulatory Visit: Payer: Self-pay

## 2019-09-08 ENCOUNTER — Telehealth: Payer: Self-pay

## 2019-09-08 MED ORDER — ATORVASTATIN CALCIUM 40 MG PO TABS: 40.0000 mg | ORAL_TABLET | Freq: Every day | ORAL | 1 refills | Status: DC

## 2019-09-08 NOTE — Telephone Encounter (Signed)
Patient called stating he is on his way to work and he wanted to check on the status of his referral for PT and he also needs a refill on Atorvastatin.  I RETURNED PT CALL AND LEFT HIM A V/M STATING THE REFERRAL HAS BEEN PLACED AND HE SHOULD RECEIVE A CALL WITH DATE AND TIME SOON. ALSO REFILL HAS BEEN SENT. Lonia Mad

## 2019-09-09 NOTE — Progress Notes (Signed)
I agree with the above plan 

## 2019-10-16 ENCOUNTER — Other Ambulatory Visit: Payer: Self-pay | Admitting: Nurse Practitioner

## 2019-11-14 ENCOUNTER — Encounter (HOSPITAL_COMMUNITY): Payer: Self-pay

## 2019-11-14 ENCOUNTER — Ambulatory Visit (INDEPENDENT_AMBULATORY_CARE_PROVIDER_SITE_OTHER): Payer: BC Managed Care – PPO

## 2019-11-14 ENCOUNTER — Ambulatory Visit (HOSPITAL_COMMUNITY)
Admission: EM | Admit: 2019-11-14 | Discharge: 2019-11-14 | Disposition: A | Discharge status: Home / Self Care | Payer: BC Managed Care – PPO | Attending: Internal Medicine | Admitting: Internal Medicine

## 2019-11-14 ENCOUNTER — Other Ambulatory Visit: Payer: Self-pay

## 2019-11-14 DIAGNOSIS — M19071 Primary osteoarthritis, right ankle and foot: Secondary | ICD-10-CM

## 2019-11-14 NOTE — ED Triage Notes (Signed)
Pt states he has right toe pain. ( big toe ) pt states it's been hurting for a week.

## 2019-11-16 ENCOUNTER — Telehealth: Payer: Self-pay

## 2019-11-16 NOTE — Telephone Encounter (Signed)
Patient called stating he went to the urgent care for his foot and he would like to come in for a 2nd opinion. I left pt a v/m to call the office. YRL,RMA

## 2019-11-23 ENCOUNTER — Ambulatory Visit: Payer: BC Managed Care – PPO | Admitting: Physician Assistant

## 2019-11-23 ENCOUNTER — Encounter: Payer: Self-pay | Admitting: Physician Assistant

## 2019-11-23 ENCOUNTER — Other Ambulatory Visit: Payer: Self-pay

## 2019-11-23 DIAGNOSIS — M2021 Hallux rigidus, right foot: Secondary | ICD-10-CM | POA: Diagnosis not present

## 2019-11-23 NOTE — Progress Notes (Signed)
Office Visit Note   Patient: Ryan Salazar           Date of Birth: 1951-01-24           MRN: [id number] Visit Date: 11/23/2019              Requested by: Ryan Salazar, [address] [place],  [place] [id number] PCP: Ryan Brine, FNP   Assessment & Plan: Visit Diagnoses:  1. Hallux rigidus, right foot     Plan: Due to the fact the patient's pain is not constant and he is tried no conservative treatment recommend conservative treatment.  This would be a stiff bottom shoe with a wide toe box.  Would not recommend any NSAIDs due to patient's renal insufficiency.  Next step would be either cheilectomy versus first MTP joint fusion.  If he fails conservative treatment he will follow-up with Ryan Salazar for possible surgical intervention.  I did write all of this down for him today.  Follow-Up Instructions: Return if symptoms worsen or fail to improve.   Orders:  No orders of the defined types were placed in this encounter.  No orders of the defined types were placed in this encounter.     Procedures: No procedures performed   Clinical Data: No additional findings.   Subjective: Chief Complaint  Patient presents with  . Right Great Toe - Pain    HPI Ryan Salazar is a pleasant 68 year old male were seen for the first time today due to right great toe pain.  He states that he has had no injury to the toe.  Pain is valgus of right great toe.  He states he has had occasional pain like this before his pain this time persisted and had throbbing swelling great toe.  Said no history of gout.  Does have renal insufficiency. He states he is having no pain in the right foot today. 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  No acute fracture.  No bony destruction. Review of Systems Negative for fevers chills shortness of breath chest pain.  Please see HPI.   Objective: Vital Signs: There were no vitals taken for this visit.  Physical Exam Constitutional:      Appearance: He is not ill-appearing or diaphoretic.  Cardiovascular:     Pulses: Normal pulses.  Pulmonary:     Effort: Pulmonary effort is normal.  Neurological:     Mental Status: He is alert and oriented to person, place, and time.     Ortho Exam Bilateral feet dorsal pedal pulses are 2+.  There is no impending rashes skin lesions ulcerations.  He has limited range of motion of the right great toe with dorsiflexion plantarflexion compared to full range of motion of the left great toe through the MP joint.  Specialty Comments:  No specialty comments available.  Imaging: No results found.   PMFS History: Patient Active Problem List   Diagnosis Date Noted  . Tobacco abuse 08/04/2019  . Left arm weakness 08/04/2019  . Ischemic stroke ([facility]) 08/04/2019  . Acute ischemic stroke ([facility]) 07/21/2019  . CKD (chronic kidney disease) stage 4, GFR 15-29 ml/min ([facility]) 07/21/2019  . Acute otitis media 06/11/2019  . Excessive cerumen in right ear canal 06/11/2019  . Right hand pain 06/03/2019  . Essential hypertension 12/28/2018  . Acute pain of left knee 12/28/2018  . Stage 2 chronic  kidney disease 12/28/2018  . Nocturia 12/28/2018   Past Medical History:  Diagnosis Date  . Blood transfusion 1989  . GERD (gastroesophageal reflux disease)   . Hypertension   . Ulcer 1989   BLEEDING STOMACH ULCER    Family History  Problem Relation Age of Onset  . Stroke Mother   . Healthy Father   . Colon cancer Neg Hx   . Colon polyps Neg Hx     Past Surgical History:  Procedure Laterality Date  . [place]  . TONSILLECTOMY  AS CHILD   Social History   Occupational History  . Not on file  Tobacco Use  . Smoking status: Current Some Day Smoker    Types: Cigarettes  . Smokeless tobacco: Never Used  . Tobacco comment: 1 PACK  EVERY 3 DAYS  Substance and Sexual  Activity  . Alcohol use: Yes  . Drug use: Yes    Types: Marijuana    Comment: rarely  . Sexual activity: Not on file

## 2019-12-07 ENCOUNTER — Other Ambulatory Visit: Payer: Self-pay

## 2019-12-07 ENCOUNTER — Ambulatory Visit (INDEPENDENT_AMBULATORY_CARE_PROVIDER_SITE_OTHER): Payer: BC Managed Care – PPO | Admitting: Nurse Practitioner

## 2019-12-07 ENCOUNTER — Encounter: Payer: Self-pay | Admitting: Nurse Practitioner

## 2019-12-07 VITALS — BP 138/84 | HR 72 | Temp 98.2°F | Ht 70.0 in | Wt 214.4 lb

## 2019-12-07 DIAGNOSIS — I69359 Hemiplegia and hemiparesis following cerebral infarction affecting unspecified side: Secondary | ICD-10-CM

## 2019-12-07 DIAGNOSIS — I129 Hypertensive chronic kidney disease with stage 1 through stage 4 chronic kidney disease, or unspecified chronic kidney disease: Secondary | ICD-10-CM | POA: Diagnosis not present

## 2019-12-07 DIAGNOSIS — Z23 Encounter for immunization: Secondary | ICD-10-CM

## 2019-12-07 DIAGNOSIS — R7309 Other abnormal glucose: Secondary | ICD-10-CM

## 2019-12-07 DIAGNOSIS — Z0000 Encounter for general adult medical examination without abnormal findings: Secondary | ICD-10-CM

## 2019-12-07 DIAGNOSIS — N184 Chronic kidney disease, stage 4 (severe): Secondary | ICD-10-CM | POA: Diagnosis not present

## 2019-12-07 DIAGNOSIS — Z720 Tobacco use: Secondary | ICD-10-CM

## 2019-12-07 DIAGNOSIS — I639 Cerebral infarction, unspecified: Secondary | ICD-10-CM

## 2019-12-07 MED ORDER — VARENICLINE TARTRATE 1 MG PO TABS: 1.0000 mg | ORAL_TABLET | Freq: Two times a day (BID) | ORAL | 2 refills | Status: DC

## 2019-12-07 MED ORDER — PNEUMOCOCCAL 13-VAL CONJ VACC IM SUSP: 0.5000 mL | INTRAMUSCULAR | 0 refills | Status: AC

## 2019-12-07 MED ORDER — CHANTIX STARTING MONTH PAK 0.5 MG X 11 & 1 MG X 42 PO TABS: ORAL_TABLET | ORAL | 0 refills | Status: DC

## 2019-12-07 NOTE — Patient Instructions (Signed)
Health Maintenance  Topic Date Due  . INFLUENZA VACCINE  08/01/2019  . PNA vac Low Risk Adult (2 of 2 - PCV13) 12/02/2019  . COLONOSCOPY  08/30/2021  . TETANUS/TDAP  12/13/2026  . Hepatitis C Screening  Completed    Health Maintenance After Age 68 After age 31, you are at a higher risk for certain long-term diseases and infections as well as injuries from falls. Falls are a major cause of broken bones and head injuries in people who are older than age 59. Getting regular preventive care can help to keep you healthy and well. Preventive care includes getting regular testing and making lifestyle changes as recommended by your health care provider. Talk with your health care provider about:  Which screenings and tests you should have. A screening is a test that checks for a disease when you have no symptoms.  A diet and exercise plan that is right for you. What should I know about screenings and tests to prevent falls? Screening and testing are the best ways to find a health problem early. Early diagnosis and treatment give you the best chance of managing medical conditions that are common after age 51. Certain conditions and lifestyle choices may make you more likely to have a fall. Your health care provider may recommend:  Regular vision checks. Poor vision and conditions such as cataracts can make you more likely to have a fall. If you wear glasses, make sure to get your prescription updated if your vision changes.  Medicine review. Work with your health care provider to regularly review all of the medicines you are taking, including over-the-counter medicines. Ask your health care provider about any side effects that may make you more likely to have a fall. Tell your health care provider if any medicines that you take make you feel dizzy or sleepy.  Osteoporosis screening. Osteoporosis is a condition that causes the bones to get weaker. This can make the bones weak and cause them to break more  easily.  Blood pressure screening. Blood pressure changes and medicines to control blood pressure can make you feel dizzy.  Strength and balance checks. Your health care provider may recommend certain tests to check your strength and balance while standing, walking, or changing positions.  Foot health exam. Foot pain and numbness, as well as not wearing proper footwear, can make you more likely to have a fall.  Depression screening. You may be more likely to have a fall if you have a fear of falling, feel emotionally low, or feel unable to do activities that you used to do.  Alcohol use screening. Using too much alcohol can affect your balance and may make you more likely to have a fall. What actions can I take to lower my risk of falls? General instructions  Talk with your health care provider about your risks for falling. Tell your health care provider if: ? You fall. Be sure to tell your health care provider about all falls, even ones that seem minor. ? You feel dizzy, sleepy, or off-balance.  Take over-the-counter and prescription medicines only as told by your health care provider. These include any supplements.  Eat a healthy diet and maintain a healthy weight. A healthy diet includes low-fat dairy products, low-fat (lean) meats, and fiber from whole grains, beans, and lots of fruits and vegetables. Home safety  Remove any tripping hazards, such as rugs, cords, and clutter.  Install safety equipment such as grab bars in bathrooms and safety rails on  stairs.  Keep rooms and walkways well-lit. Activity   Follow a regular exercise program to stay fit. This will help you maintain your balance. Ask your health care provider what types of exercise are appropriate for you.  If you need a cane or walker, use it as recommended by your health care provider.  Wear supportive shoes that have nonskid soles. Lifestyle  Do not drink alcohol if your health care provider tells you not to  drink.  If you drink alcohol, limit how much you have: ? 0-1 drink a day for women. ? 0-2 drinks a day for men.  Be aware of how much alcohol is in your drink. In the [place] one drink equals one typical bottle of beer (12 oz), one-half glass of wine (5 oz), or one shot of hard liquor (1 oz).  Do not use any products that contain nicotine or tobacco, such as cigarettes and e-cigarettes. If you need help quitting, ask your health care provider. Summary  Having a healthy lifestyle and getting preventive care can help to protect your health and wellness after age 4.  Screening and testing are the best way to find a health problem early and help you avoid having a fall. Early diagnosis and treatment give you the best chance for managing medical conditions that are more common for people who are older than age 41.  Falls are a major cause of broken bones and head injuries in people who are older than age 47. Take precautions to prevent a fall at home.  Work with your health care provider to learn what changes you can make to improve your health and wellness and to prevent falls. This information is not intended to replace advice given to you by your health care provider. Make sure you discuss any questions you have with your health care provider. Document Released: 10/30/2017 Document Revised: 04/09/2019 Document Reviewed: 10/30/2017 Elsevier Patient Education  2020 [employer].

## 2019-12-07 NOTE — Progress Notes (Signed)
Subjective:     Patient ID: Ryan Salazar , male    DOB: 02-06-1951 , 68 y.o.   MRN: [id number]   Chief Complaint  Patient presents with  . Annual Exam    HPI  Here for HM    Hypertension This is a chronic problem. The current episode started more than 1 year ago. The problem is unchanged. The problem is controlled. Pertinent negatives include no anxiety, blurred vision or headaches. There are no associated agents to hypertension.    Men's preventive visit. Patient Health Questionnaire (PHQ-2) is    Office Visit from 12/07/2019 in [facility]  PHQ-2 Total Score  0     Patient is on a regular diet, low fat and low salt. Not exercising regularly since having his stroke.  Marital status: Single.  Relevant history for alcohol use is:  Social History   Substance and Sexual Activity  Alcohol Use Yes   Relevant history for tobacco use is:  Social History   Tobacco Use  Smoking Status Current Some Day Smoker  . Types: Cigarettes  Smokeless Tobacco Never Used  Tobacco Comment   1 PACK  EVERY 3 DAYS   Past Medical History:  Diagnosis Date  . Blood transfusion 1989  . GERD (gastroesophageal reflux disease)   . Hypertension   . Ulcer 1989   BLEEDING STOMACH ULCER     Family History  Problem Relation Age of Onset  . Stroke Mother   . Healthy Father   . Colon cancer Neg Hx   . Colon polyps Neg Hx     Current Outpatient Medications:  .  amLODipine (NORVASC) 10 MG tablet, Take 1 tablet by mouth Daily. , Disp: , Rfl:  .  aspirin EC 81 MG EC tablet, Take 1 tablet (81 mg total) by mouth daily., Disp: 30 tablet, Rfl: 1 .  atorvastatin (LIPITOR) 40 MG tablet, Take 1 tablet (40 mg total) by mouth daily at 6 PM., Disp: 90 tablet, Rfl: 1 .  calcitRIOL (ROCALTROL) 0.25 MCG capsule, Take 0.25 mcg by mouth daily., Disp: , Rfl:  .  doxazosin (CARDURA) 4 MG tablet, Take 4 mg by mouth 2 (two) times daily., Disp: , Rfl:  .  furosemide (LASIX) 80 MG tablet, Take 1  tablet by mouth 2 (two) times daily. , Disp: , Rfl:  .  KLOR-CON M20 20 MEQ tablet, Take 20 mEq by mouth Daily. , Disp: , Rfl:  .  labetalol (NORMODYNE) 300 MG tablet, TAKE 1 TABLET BY MOUTH TWICE A DAY, Disp: 180 tablet, Rfl: 0 .  omeprazole (PRILOSEC) 20 MG capsule, Take 1 tablet by mouth Twice daily., Disp: , Rfl:  .  varenicline (CHANTIX STARTING MONTH PAK) 0.5 MG X 11 & 1 MG X 42 tablet, Take one 0.5 mg tablet by mouth once daily for 3 days, then increase to one 0.5 mg tablet twice daily for 4 days, then increase to one 1 mg tablet twice daily. (Patient not taking: Reported on 12/07/2019), Disp: 53 tablet, Rfl: 0   No Known Allergies   Review of Systems  Constitutional: Negative.   HENT: Negative.   Eyes: Negative.  Negative for blurred vision.  Respiratory: Negative.   Cardiovascular: Negative.   Gastrointestinal: Negative.   Endocrine: Negative.   Genitourinary: Negative.   Musculoskeletal: Negative.        Left knee pain   Skin: Negative.   Allergic/Immunologic: Negative.   Neurological: Negative.  Negative for dizziness and headaches.  Hematological: Negative.  Psychiatric/Behavioral: Negative.      Today's Vitals   12/07/19 1415  BP: 138/84  Pulse: 72  Temp: 98.2 F (36.8 C)  TempSrc: Oral  Weight: 214 lb 6.4 oz (97.3 kg)  Height: _0  (1.778 m)  PainSc: 0-No pain   Body mass index is 30.76 kg/m.   Objective:  Physical Exam Vitals signs reviewed.  Constitutional:      Appearance: He is well-developed.  HENT:     Head: Normocephalic and atraumatic.     Right Ear: External ear normal.     Left Ear: External ear normal.     Nose: Nose normal. No congestion.  Eyes:     Conjunctiva/sclera: Conjunctivae normal.     Pupils: Pupils are equal, round, and reactive to light.  Neck:     Musculoskeletal: Normal range of motion and neck supple.  Cardiovascular:     Rate and Rhythm: Normal rate and regular rhythm.     Heart sounds: Normal heart sounds.   Pulmonary:     Effort: Pulmonary effort is normal.     Breath sounds: Normal breath sounds.  Abdominal:     General: Bowel sounds are normal.  Musculoskeletal: Normal range of motion.     Comments: Reports he has arthritis to his toe  Skin:    General: Skin is warm and dry.     Capillary Refill: Capillary refill takes less than 2 seconds.  Neurological:     General: No focal deficit present.     Mental Status: He is alert and oriented to person, place, and time.        Assessment And Plan:     1. Health maintenance examination . Behavior modifications discussed and diet history reviewed.   . Pt will continue to exercise regularly and modify diet with low GI, plant based foods and decrease intake of processed foods.  . Recommend intake of daily multivitamin, Vitamin D, and calcium.  . Recommend colonoscopy for preventive screenings, as well as recommend immunizations that include influenza, TDAP, and Shingles  2. Benign hypertension with chronic kidney disease, stage IV (HCC) B/P is controlled.  CMP ordered to check renal function.  The importance of regular exercise and dietary modification was stressed to the patient.  - CMP14+EGFR  3. Tobacco abuse  - varenicline (CHANTIX STARTING MONTH PAK) 0.5 MG X 11 & 1 MG X 42 tablet; Take one 0.5 mg tablet by mouth once daily for 3 days, then increase to one 0.5 mg tablet twice daily for 4 days, then increase to one 1 mg tablet twice daily.  Dispense: 53 tablet; Refill: 0 - varenicline (CHANTIX CONTINUING MONTH PAK) 1 MG tablet; Take 1 tablet (1 mg total) by mouth 2 (two) times daily.  Dispense: 60 tablet; Refill: 2  4. Ischemic stroke [facility])  He is still a little less than 6 months post stroke, advised him his mild weakness will continue to improve up to 1 year.    Continue follow up with Neurology  5. Abnormal glucose  Chronic, stable  Continue to avoid sugary foods and drinks. - Lipid Profile - Hemoglobin A1c           [name], FNP 12/07/2019

## 2019-12-08 LAB — CMP14+EGFR
ALT: 25 IU/L (ref 0–44)
AST: 29 IU/L (ref 0–40)
Albumin/Globulin Ratio: 1.8 (ref 1.2–2.2)
Albumin: 4.1 g/dL (ref 3.8–4.8)
Alkaline Phosphatase: 57 IU/L (ref 39–117)
BUN/Creatinine Ratio: 11 (ref 10–24)
BUN: 38 mg/dL — ABNORMAL HIGH (ref 8–27)
Bilirubin Total: 0.5 mg/dL (ref 0.0–1.2)
CO2: 22 mmol/L (ref 20–29)
Calcium: 8.5 mg/dL — ABNORMAL LOW (ref 8.6–10.2)
Chloride: 105 mmol/L (ref 96–106)
Creatinine, Ser: 3.61 mg/dL — ABNORMAL HIGH (ref 0.76–1.27)
GFR calc Af Amer: 19 mL/min/{1.73_m2} — ABNORMAL LOW (ref >59)
GFR calc non Af Amer: 16 mL/min/{1.73_m2} — ABNORMAL LOW (ref >59)
Globulin, Total: 2.3 g/dL (ref 1.5–4.5)
Glucose: 91 mg/dL (ref 65–99)
Potassium: 3.8 mmol/L (ref 3.5–5.2)
Sodium: 146 mmol/L — ABNORMAL HIGH (ref 134–144)
Total Protein: 6.4 g/dL (ref 6.0–8.5)

## 2019-12-08 LAB — LIPID PANEL
Chol/HDL Ratio: 3 ratio (ref 0.0–5.0)
Cholesterol, Total: 96 mg/dL — ABNORMAL LOW (ref 100–199)
HDL: 32 mg/dL — ABNORMAL LOW (ref >39)
LDL Chol Calc (NIH): 35 mg/dL (ref 0–99)
Triglycerides: 173 mg/dL — ABNORMAL HIGH (ref 0–149)
VLDL Cholesterol Cal: 29 mg/dL (ref 5–40)

## 2019-12-08 LAB — HEMOGLOBIN A1C
Est. average glucose Bld gHb Est-mCnc: 123 mg/dL
Hgb A1c MFr Bld: 5.9 % — ABNORMAL HIGH (ref 4.8–5.6)

## 2019-12-21 ENCOUNTER — Other Ambulatory Visit: Payer: Self-pay

## 2019-12-21 MED ORDER — DOXAZOSIN MESYLATE 4 MG PO TABS: 4.0000 mg | ORAL_TABLET | Freq: Two times a day (BID) | ORAL | 0 refills | Status: DC

## 2020-01-06 ENCOUNTER — Encounter: Payer: Self-pay | Admitting: Adult Health

## 2020-01-06 ENCOUNTER — Ambulatory Visit: Payer: BC Managed Care – PPO | Admitting: Adult Health

## 2020-01-06 ENCOUNTER — Other Ambulatory Visit: Payer: Self-pay

## 2020-01-06 VITALS — BP 146/85 | HR 69 | Temp 97.7°F | Ht 71.5 in | Wt 214.6 lb

## 2020-01-06 DIAGNOSIS — I10 Essential (primary) hypertension: Secondary | ICD-10-CM | POA: Diagnosis not present

## 2020-01-06 DIAGNOSIS — I6350 Cerebral infarction due to unspecified occlusion or stenosis of unspecified cerebral artery: Secondary | ICD-10-CM | POA: Diagnosis not present

## 2020-01-06 DIAGNOSIS — R202 Paresthesia of skin: Secondary | ICD-10-CM | POA: Diagnosis not present

## 2020-01-06 DIAGNOSIS — E785 Hyperlipidemia, unspecified: Secondary | ICD-10-CM | POA: Diagnosis not present

## 2020-01-06 DIAGNOSIS — Z720 Tobacco use: Secondary | ICD-10-CM
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
## 2020-01-06 NOTE — Patient Instructions (Signed)
Continue aspirin 81 mg daily  and Lipitor for secondary stroke prevention  Continue to follow up with PCP regarding cholesterol and blood pressure management   Continue to monitor blood pressure at home  Maintain strict control of hypertension with blood pressure goal below 130/90, diabetes with hemoglobin A1c goal below 6.5% and cholesterol with LDL cholesterol (bad cholesterol) goal below 70 mg/dL. I also advised the patient to eat a healthy diet with plenty of whole grains, cereals, fruits and vegetables, exercise regularly and maintain ideal body weight.         Thank you for coming to see [place] at [facility]. I hope we have been able to provide you high quality care today.  You may receive a patient satisfaction survey over the next few weeks. We would appreciate your feedback and comments so that we may continue to improve ourselves and the health of our patients.

## 2020-01-10 ENCOUNTER — Other Ambulatory Visit: Payer: Self-pay | Admitting: Nurse Practitioner

## 2020-01-11 NOTE — Progress Notes (Signed)
I agree with the above plan 

## 2020-01-24 ENCOUNTER — Ambulatory Visit: Payer: BC Managed Care – PPO | Attending: Internal Medicine

## 2020-01-24 DIAGNOSIS — Z23 Encounter for immunization: Secondary | ICD-10-CM | POA: Insufficient documentation

## 2020-01-25 NOTE — Progress Notes (Signed)
   Covid-19 Vaccination Clinic  Name:  Ryan Salazar    MRN: [id number] DOB: 1951-08-01  01/24/2020  [name] was observed post Covid-19 immunization for 15 minutes without incidence. He was provided with Vaccine Information Sheet and instruction to access the V-Safe system.   [name] was instructed to call 911 with any severe reactions post vaccine: Marland Kitchen Difficulty breathing  . Swelling of your face and throat  . A fast heartbeat  . A bad rash all over your body  . Dizziness and weakness    Immunizations Administered    Name Date Dose VIS Date Route   Moderna COVID-19 Vaccine 01/24/2020  3:32 PM 0.5 mL 12/01/2019 Intramuscular   Manufacturer: Levan Hurst   Lot: [id number]   [id number]      Documented on behalf of: C. Jerline Pain

## 2020-01-27 ENCOUNTER — Other Ambulatory Visit: Payer: Self-pay

## 2020-01-27 DIAGNOSIS — Z720 Tobacco use: Secondary | ICD-10-CM

## 2020-01-27 MED ORDER — VARENICLINE TARTRATE 1 MG PO TABS: 1.0000 mg | ORAL_TABLET | Freq: Two times a day (BID) | ORAL | 2 refills | Status: DC

## 2020-02-10 ENCOUNTER — Other Ambulatory Visit: Payer: Self-pay | Admitting: Nurse Practitioner

## 2020-02-21 ENCOUNTER — Ambulatory Visit: Payer: BC Managed Care – PPO | Attending: Internal Medicine

## 2020-02-21 ENCOUNTER — Other Ambulatory Visit: Payer: Self-pay

## 2020-02-21 DIAGNOSIS — Z23 Encounter for immunization: Secondary | ICD-10-CM | POA: Insufficient documentation

## 2020-02-21 NOTE — Progress Notes (Signed)
   Covid-19 Vaccination Clinic  Name:  Ryan Salazar    MRN: [id number] DOB: [birth year]  02/21/2020  Ryan Salazar was observed post Covid-19 immunization for 15 minutes without incidence. He was provided with Vaccine Information Sheet and instruction to access the V-Safe system.   Ryan Salazar was instructed to call 911 with any severe reactions post vaccine: Marland Kitchen Difficulty breathing  . Swelling of your face and throat  . A fast heartbeat  . A bad rash all over your body  . Dizziness and weakness    Immunizations Administered    Name Date Dose VIS Date Route   Moderna COVID-19 Vaccine 02/21/2020  3:22 PM 0.5 mL 12/01/2019 Intramuscular   Manufacturer: Moderna   Lot: [id number]   [place]:[id number]

## 2020-03-13 ENCOUNTER — Other Ambulatory Visit: Payer: Self-pay | Admitting: Nurse Practitioner

## 2020-04-05 ENCOUNTER — Other Ambulatory Visit: Payer: Self-pay | Admitting: Nurse Practitioner

## 2020-04-06 ENCOUNTER — Other Ambulatory Visit: Payer: Self-pay

## 2020-04-06 ENCOUNTER — Encounter: Payer: Self-pay | Admitting: Nurse Practitioner

## 2020-04-06 ENCOUNTER — Ambulatory Visit (INDEPENDENT_AMBULATORY_CARE_PROVIDER_SITE_OTHER): Payer: BC Managed Care – PPO | Admitting: Nurse Practitioner

## 2020-04-06 VITALS — BP 150/92 | HR 64 | Temp 98.4°F | Ht 69.8 in | Wt 209.6 lb

## 2020-04-06 DIAGNOSIS — I129 Hypertensive chronic kidney disease with stage 1 through stage 4 chronic kidney disease, or unspecified chronic kidney disease: Secondary | ICD-10-CM | POA: Diagnosis not present

## 2020-04-06 DIAGNOSIS — Z8673 Personal history of transient ischemic attack (TIA), and cerebral infarction without residual deficits: Secondary | ICD-10-CM

## 2020-04-06 DIAGNOSIS — N184 Chronic kidney disease, stage 4 (severe): Secondary | ICD-10-CM

## 2020-04-06 DIAGNOSIS — R600 Localized edema: Secondary | ICD-10-CM | POA: Diagnosis not present

## 2020-04-06 DIAGNOSIS — E7800 Pure hypercholesterolemia, unspecified: Secondary | ICD-10-CM

## 2020-04-06 DIAGNOSIS — R7309 Other abnormal glucose: Secondary | ICD-10-CM

## 2020-04-06 NOTE — Progress Notes (Signed)
This visit occurred during the SARS-CoV-2 public health emergency.  Safety protocols were in place, including screening questions prior to the visit, additional usage of staff PPE, and extensive cleaning of exam room while observing appropriate contact time as indicated for disinfecting solutions.  Subjective:     Patient ID: Ryan Salazar , male    DOB: 1951-01-20 , 69 y.o.   MRN: [id number]   Chief Complaint  Patient presents with  . Hypertension    HPI  He did eat BBQ ribs on Sunday.  His mother had surgery on her knee and is currently in a facility.   Wt Readings from Last 3 Encounters: 04/06/20 : 209 lb 9.6 oz (95.1 kg) 01/06/20 : 214 lb 9.6 oz (97.3 kg) 12/07/19 : 214 lb 6.4 oz (97.3 kg)  He has not been as active, has not been riding his bicycle or doing yard work.  This week his blood pressure has not been less than 546 systolic.     Hypertension This is a chronic problem. The current episode started more than 1 year ago. The problem is uncontrolled. Pertinent negatives include no anxiety, chest pain, headaches or palpitations. Risk factors for coronary artery disease include sedentary lifestyle. Past treatments include diuretics and beta blockers. There are no compliance problems.  Hypertensive end-organ damage includes kidney disease. Identifiable causes of hypertension include chronic renal disease.     Past Medical History:  Diagnosis Date  . Blood transfusion 1989  . GERD (gastroesophageal reflux disease)   . Hypertension   . Ulcer 1989   BLEEDING STOMACH ULCER     Family History  Problem Relation Age of Onset  . Stroke Mother   . Healthy Father   . Colon cancer Neg Hx   . Colon polyps Neg Hx      Current Outpatient Medications:  .  amLODipine (NORVASC) 10 MG tablet, Take 1 tablet by mouth Daily. , Disp: , Rfl:  .  aspirin EC 81 MG EC tablet, Take 1 tablet (81 mg total) by mouth daily., Disp: 30 tablet, Rfl: 1 .  atorvastatin (LIPITOR) 40 MG tablet, TAKE 1  TABLET (40 MG TOTAL) BY MOUTH DAILY AT 6 PM., Disp: 90 tablet, Rfl: 1 .  calcitRIOL (ROCALTROL) 0.25 MCG capsule, Take 0.25 mcg by mouth daily., Disp: , Rfl:  .  doxazosin (CARDURA) 4 MG tablet, TAKE 1 TABLET (4 MG TOTAL) BY MOUTH 2 (TWO) TIMES DAILY., Disp: 180 tablet, Rfl: 0 .  furosemide (LASIX) 80 MG tablet, Take 1 tablet by mouth 2 (two) times daily. , Disp: , Rfl:  .  KLOR-CON M20 20 MEQ tablet, Take 20 mEq by mouth Daily. , Disp: , Rfl:  .  labetalol (NORMODYNE) 300 MG tablet, TAKE 1 TABLET BY MOUTH TWICE A DAY, Disp: 180 tablet, Rfl: 0 .  omeprazole (PRILOSEC) 20 MG capsule, Take 1 tablet by mouth Twice daily., Disp: , Rfl:  .  varenicline (CHANTIX CONTINUING MONTH PAK) 1 MG tablet, Take 1 tablet (1 mg total) by mouth 2 (two) times daily., Disp: 60 tablet, Rfl: 2   No Known Allergies   Review of Systems  Constitutional: Negative.  Negative for fatigue.  Respiratory: Negative.   Cardiovascular: Negative for chest pain, palpitations and leg swelling.  Neurological: Negative for dizziness and headaches.  Psychiatric/Behavioral: Negative.      Today's Vitals   04/06/20 1412  BP: (!) 160/92  Pulse: 68  Temp: 98.4 F (36.9 C)  Weight: 209 lb 9.6 oz (95.1 kg)  Height:  5' 9.8" (1.773 m)  PainSc: 0-No pain   Body mass index is 30.25 kg/m.   Objective:  Physical Exam Constitutional:      Appearance: Normal appearance.  Cardiovascular:     Rate and Rhythm: Normal rate and regular rhythm.     Pulses: Normal pulses.     Heart sounds: Normal heart sounds. No murmur.  Pulmonary:     Effort: Pulmonary effort is normal.     Breath sounds: Normal breath sounds. No wheezing.  Musculoskeletal:     Right lower leg: Edema (trace to 1+) present.     Left lower leg: Edema (trace to 1+) present.  Neurological:     General: No focal deficit present.     Mental Status: He is alert and oriented to person, place, and time.  Psychiatric:        Mood and Affect: Mood normal.         Behavior: Behavior normal.        Thought Content: Thought content normal.        Judgment: Judgment normal.         Assessment And Plan:     1. Benign hypertension with chronic kidney disease, stage IV (HCC)  Chronic, elevated today he does admit to eating pork over the weekend  He is to call with his blood pressure readings on Monday we may need to adjust he medications - CMP14+EGFR  2. CKD (chronic kidney disease) stage 4, GFR 15-29 ml/min ([place])   3. Abnormal glucose  Was 6.1 at his hospitalization  Will recheck today  Limit intake of white pastas, breads, sweets - Hemoglobin A1c  4. Localized edema  Trace to 1+  Encouraged to increase water intake and elevate legs when possible.  5. History of stroke   6. Elevated cholesterol  Tolerating statin well  Will recheck cholesterol. - Lipid panel   [name], FNP    THE PATIENT IS ENCOURAGED TO PRACTICE SOCIAL DISTANCING DUE TO THE COVID-19 PANDEMIC.

## 2020-04-06 NOTE — Patient Instructions (Signed)
DASH Eating Plan DASH stands for "Dietary Approaches to Stop Hypertension." The DASH eating plan is a healthy eating plan that has been shown to reduce high blood pressure (hypertension). It may also reduce your risk for type 2 diabetes, heart disease, and stroke. The DASH eating plan may also help with weight loss. What are tips for following this plan?  General guidelines  Avoid eating more than 2,300 mg (milligrams) of salt (sodium) a day. If you have hypertension, you may need to reduce your sodium intake to 1,500 mg a day.  Limit alcohol intake to no more than 1 drink a day for nonpregnant women and 2 drinks a day for men. One drink equals 12 oz of beer, 5 oz of wine, or 1 oz of hard liquor.  Work with your health care provider to maintain a healthy body weight or to lose weight. Ask what an ideal weight is for you.  Get at least 30 minutes of exercise that causes your heart to beat faster (aerobic exercise) most days of the week. Activities may include walking, swimming, or biking.  Work with your health care provider or diet and nutrition specialist (dietitian) to adjust your eating plan to your individual calorie needs. Reading food labels   Check food labels for the amount of sodium per serving. Choose foods with less than 5 percent of the Daily Value of sodium. Generally, foods with less than 300 mg of sodium per serving fit into this eating plan.  To find whole grains, look for the word "whole" as the first word in the ingredient list. Shopping  Buy products labeled as "low-sodium" or "no salt added."  Buy fresh foods. Avoid canned foods and premade or frozen meals. Cooking  Avoid adding salt when cooking. Use salt-free seasonings or herbs instead of table salt or sea salt. Check with your health care provider or pharmacist before using salt substitutes.  Do not fry foods. Cook foods using healthy methods such as baking, boiling, grilling, and broiling instead.  Cook with  heart-healthy oils, such as olive, canola, soybean, or sunflower oil. Meal planning  Eat a balanced diet that includes: ? 5 or more servings of fruits and vegetables each day. At each meal, try to fill half of your plate with fruits and vegetables. ? Up to 6-8 servings of whole grains each day. ? Less than 6 oz of lean meat, poultry, or fish each day. A 3-oz serving of meat is about the same size as a deck of cards. One egg equals 1 oz. ? 2 servings of low-fat dairy each day. ? A serving of nuts, seeds, or beans 5 times each week. ? Heart-healthy fats. Healthy fats called Omega-3 fatty acids are found in foods such as flaxseeds and coldwater fish, like sardines, salmon, and mackerel.  Limit how much you eat of the following: ? Canned or prepackaged foods. ? Food that is high in trans fat, such as fried foods. ? Food that is high in saturated fat, such as fatty meat. ? Sweets, desserts, sugary drinks, and other foods with added sugar. ? Full-fat dairy products.  Do not salt foods before eating.  Try to eat at least 2 vegetarian meals each week.  Eat more home-cooked food and less restaurant, buffet, and fast food.  When eating at a restaurant, ask that your food be prepared with less salt or no salt, if possible. What foods are recommended? The items listed may not be a complete list. Talk with your dietitian about   what dietary choices are best for you. Grains Whole-grain or whole-wheat bread. Whole-grain or whole-wheat pasta. Brown rice. Oatmeal. Quinoa. Bulgur. Whole-grain and low-sodium cereals. Pita bread. Low-fat, low-sodium crackers. Whole-wheat flour tortillas. Vegetables Fresh or frozen vegetables (raw, steamed, roasted, or grilled). Low-sodium or reduced-sodium tomato and vegetable juice. Low-sodium or reduced-sodium tomato sauce and tomato paste. Low-sodium or reduced-sodium canned vegetables. Fruits All fresh, dried, or frozen fruit. Canned fruit in natural juice (without  added sugar). Meat and other protein foods Skinless chicken or turkey. Ground chicken or turkey. Pork with fat trimmed off. Fish and seafood. Egg whites. Dried beans, peas, or lentils. Unsalted nuts, nut butters, and seeds. Unsalted canned beans. Lean cuts of beef with fat trimmed off. Low-sodium, lean deli meat. Dairy Low-fat (1%) or fat-free (skim) milk. Fat-free, low-fat, or reduced-fat cheeses. Nonfat, low-sodium ricotta or cottage cheese. Low-fat or nonfat yogurt. Low-fat, low-sodium cheese. Fats and oils Soft margarine without trans fats. Vegetable oil. Low-fat, reduced-fat, or light mayonnaise and salad dressings (reduced-sodium). Canola, safflower, olive, soybean, and sunflower oils. Avocado. Seasoning and other foods Herbs. Spices. Seasoning mixes without salt. Unsalted popcorn and pretzels. Fat-free sweets. What foods are not recommended? The items listed may not be a complete list. Talk with your dietitian about what dietary choices are best for you. Grains Baked goods made with fat, such as croissants, muffins, or some breads. Dry pasta or rice meal packs. Vegetables Creamed or fried vegetables. Vegetables in a cheese sauce. Regular canned vegetables (not low-sodium or reduced-sodium). Regular canned tomato sauce and paste (not low-sodium or reduced-sodium). Regular tomato and vegetable juice (not low-sodium or reduced-sodium). Pickles. Olives. Fruits Canned fruit in a light or heavy syrup. Fried fruit. Fruit in cream or butter sauce. Meat and other protein foods Fatty cuts of meat. Ribs. Fried meat. Bacon. Sausage. Bologna and other processed lunch meats. Salami. Fatback. Hotdogs. Bratwurst. Salted nuts and seeds. Canned beans with added salt. Canned or smoked fish. Whole eggs or egg yolks. Chicken or turkey with skin. Dairy Whole or 2% milk, cream, and half-and-half. Whole or full-fat cream cheese. Whole-fat or sweetened yogurt. Full-fat cheese. Nondairy creamers. Whipped toppings.  Processed cheese and cheese spreads. Fats and oils Butter. Stick margarine. Lard. Shortening. Ghee. Bacon fat. Tropical oils, such as coconut, palm kernel, or palm oil. Seasoning and other foods Salted popcorn and pretzels. Onion salt, garlic salt, seasoned salt, table salt, and sea salt. Worcestershire sauce. Tartar sauce. Barbecue sauce. Teriyaki sauce. Soy sauce, including reduced-sodium. Steak sauce. Canned and packaged gravies. Fish sauce. Oyster sauce. Cocktail sauce. Horseradish that you find on the shelf. Ketchup. Mustard. Meat flavorings and tenderizers. Bouillon cubes. Hot sauce and Tabasco sauce. Premade or packaged marinades. Premade or packaged taco seasonings. Relishes. Regular salad dressings. Where to find more information:  National Heart, Lung, and Blood Institute: www.nhlbi.nih.gov  American Heart Association: www.heart.org Summary  The DASH eating plan is a healthy eating plan that has been shown to reduce high blood pressure (hypertension). It may also reduce your risk for type 2 diabetes, heart disease, and stroke.  With the DASH eating plan, you should limit salt (sodium) intake to 2,300 mg a day. If you have hypertension, you may need to reduce your sodium intake to 1,500 mg a day.  When on the DASH eating plan, aim to eat more fresh fruits and vegetables, whole grains, lean proteins, low-fat dairy, and heart-healthy fats.  Work with your health care provider or diet and nutrition specialist (dietitian) to adjust your eating plan to your   individual calorie needs. This information is not intended to replace advice given to you by your health care provider. Make sure you discuss any questions you have with your health care provider. Document Revised: 11/29/2017 Document Reviewed: 12/10/2016 Elsevier Patient Education  2020 Elsevier Inc.  

## 2020-04-07 LAB — LIPID PANEL
Chol/HDL Ratio: 2.6 ratio (ref 0.0–5.0)
Cholesterol, Total: 87 mg/dL — ABNORMAL LOW (ref 100–199)
HDL: 33 mg/dL — ABNORMAL LOW (ref >39)
LDL Chol Calc (NIH): 35 mg/dL (ref 0–99)
Triglycerides: 101 mg/dL (ref 0–149)
VLDL Cholesterol Cal: 19 mg/dL (ref 5–40)

## 2020-04-07 LAB — HEMOGLOBIN A1C
Est. average glucose Bld gHb Est-mCnc: 120 mg/dL
Hgb A1c MFr Bld: 5.8 % — ABNORMAL HIGH (ref 4.8–5.6)

## 2020-06-05 ENCOUNTER — Other Ambulatory Visit: Payer: Self-pay | Admitting: Nurse Practitioner

## 2020-06-30 ENCOUNTER — Other Ambulatory Visit: Payer: Self-pay | Admitting: Nurse Practitioner

## 2020-07-07 ENCOUNTER — Ambulatory Visit (INDEPENDENT_AMBULATORY_CARE_PROVIDER_SITE_OTHER): Payer: BC Managed Care – PPO | Admitting: Nurse Practitioner

## 2020-07-07 ENCOUNTER — Encounter: Payer: Self-pay | Admitting: Nurse Practitioner

## 2020-07-07 ENCOUNTER — Other Ambulatory Visit: Payer: Self-pay

## 2020-07-07 VITALS — BP 150/82 | HR 69 | Temp 98.1°F | Ht 70.0 in | Wt 213.0 lb

## 2020-07-07 DIAGNOSIS — R7309 Other abnormal glucose: Secondary | ICD-10-CM | POA: Diagnosis not present

## 2020-07-07 DIAGNOSIS — E782 Mixed hyperlipidemia: Secondary | ICD-10-CM

## 2020-07-07 DIAGNOSIS — I10 Essential (primary) hypertension: Secondary | ICD-10-CM

## 2020-07-07 DIAGNOSIS — Z23 Encounter for immunization: Secondary | ICD-10-CM

## 2020-07-07 DIAGNOSIS — N184 Chronic kidney disease, stage 4 (severe): Secondary | ICD-10-CM

## 2020-07-07 MED ORDER — PNEUMOCOCCAL 13-VAL CONJ VACC IM SUSP: 0.5000 mL | INTRAMUSCULAR | 0 refills | Status: AC

## 2020-07-07 MED ORDER — HYDRALAZINE HCL 25 MG PO TABS: 25.0000 mg | ORAL_TABLET | Freq: Three times a day (TID) | ORAL | 0 refills | Status: DC

## 2020-07-07 NOTE — Progress Notes (Signed)
This visit occurred during the SARS-CoV-2 public health emergency.  Safety protocols were in place, including screening questions prior to the visit, additional usage of staff PPE, and extensive cleaning of exam room while observing appropriate contact time as indicated for disinfecting solutions.  Subjective:     Patient ID: Ryan Salazar , male    DOB: 08-12-1951 , 69 y.o.   MRN: [id number]   Chief Complaint  Patient presents with  . Hypertension    HPI  Wt Readings from Last 3 Encounters: 07/07/20 : 213 lb (96.6 kg) 04/06/20 : 209 lb 9.6 oz (95.1 kg) 01/06/20 : 214 lb 9.6 oz (97.3 kg)   Hyperlipidemia follow up - his cholesterol levels were slightly lower than normal the last visit.  Continues to take atorvastatin 40 mg daily.   He reports his blood pressure at home was 134/87 at noon today.  He is currently on Chantix and reports he is not smoking much now will puff only and will not want anymore.     Hypertension This is a chronic problem. The current episode started more than 1 year ago. The problem is uncontrolled. Pertinent negatives include no anxiety, chest pain, headaches or palpitations. Risk factors for coronary artery disease include sedentary lifestyle. Past treatments include diuretics, beta blockers and direct vasodilators. The current treatment provides moderate improvement. There are no compliance problems.  Hypertensive end-organ damage includes kidney disease. Identifiable causes of hypertension include chronic renal disease.     Past Medical History:  Diagnosis Date  . Blood transfusion 1989  . GERD (gastroesophageal reflux disease)   . Hypertension   . Ulcer 1989   BLEEDING STOMACH ULCER     Family History  Problem Relation Age of Onset  . Stroke Mother   . Healthy Father   . Colon cancer Neg Hx   . Colon polyps Neg Hx      Current Outpatient Medications:  .  amLODipine (NORVASC) 10 MG tablet, Take 1 tablet by mouth Daily. , Disp: , Rfl:  .   aspirin EC 81 MG EC tablet, Take 1 tablet (81 mg total) by mouth daily., Disp: 30 tablet, Rfl: 1 .  atorvastatin (LIPITOR) 40 MG tablet, TAKE 1 TABLET (40 MG TOTAL) BY MOUTH DAILY AT 6 PM., Disp: 90 tablet, Rfl: 1 .  calcitRIOL (ROCALTROL) 0.25 MCG capsule, Take 0.25 mcg by mouth daily., Disp: , Rfl:  .  doxazosin (CARDURA) 4 MG tablet, TAKE 1 TABLET (4 MG TOTAL) BY MOUTH 2 (TWO) TIMES DAILY., Disp: 180 tablet, Rfl: 0 .  furosemide (LASIX) 80 MG tablet, Take 1 tablet by mouth 2 (two) times daily. , Disp: , Rfl:  .  KLOR-CON M20 20 MEQ tablet, Take 20 mEq by mouth Daily. , Disp: , Rfl:  .  labetalol (NORMODYNE) 300 MG tablet, TAKE 1 TABLET BY MOUTH TWICE A DAY, Disp: 180 tablet, Rfl: 0 .  omeprazole (PRILOSEC) 20 MG capsule, Take 1 tablet by mouth Twice daily., Disp: , Rfl:  .  varenicline (CHANTIX CONTINUING MONTH PAK) 1 MG tablet, Take 1 tablet (1 mg total) by mouth 2 (two) times daily., Disp: 60 tablet, Rfl: 2   No Known Allergies   Review of Systems  Constitutional: Negative.  Negative for fatigue.  Respiratory: Negative.   Cardiovascular: Negative for chest pain, palpitations and leg swelling.  Neurological: Negative for dizziness and headaches.  Psychiatric/Behavioral: Negative.      Today's Vitals   07/07/20 1408  BP: (!) 150/82  Pulse: 69  Temp:  98.1 F (36.7 C)  TempSrc: Oral  Weight: 213 lb (96.6 kg)  Height: 5' 10" (1.778 m)  PainSc: 0-No pain   Body mass index is 30.56 kg/m.   Objective:  Physical Exam Constitutional:      Appearance: Normal appearance.  Cardiovascular:     Rate and Rhythm: Normal rate and regular rhythm.     Pulses: Normal pulses.     Heart sounds: Normal heart sounds. No murmur heard.   Pulmonary:     Effort: Pulmonary effort is normal.     Breath sounds: Normal breath sounds. No wheezing.  Musculoskeletal:     Right lower leg: Edema (trace ) present.     Left lower leg: Edema (trace ) present.  Neurological:     General: No focal  deficit present.     Mental Status: He is alert and oriented to person, place, and time.  Psychiatric:        Mood and Affect: Mood normal.        Behavior: Behavior normal.        Thought Content: Thought content normal.        Judgment: Judgment normal.         Assessment And Plan:     1. Essential hypertension  Chronic, blood pressure is better controlled.   He has had an addition of hydralazine he is not sure of the dose.   - CMP14+EGFR - hydrALAZINE (APRESOLINE) 25 MG tablet; Take 1 tablet (25 mg total) by mouth 3 (three) times daily.  Dispense: 120 tablet; Refill: 0 - CMP14 + Anion Gap  2. CKD (chronic kidney disease) stage 4, GFR 15-29 ml/min (HCC)  Continue follow up with Dr. Bonne Dolores  3. Abnormal glucose  Chronic, stable  Continue with exercise and healthy diet.   - Hemoglobin A1c  4. Encounter for immunization  - pneumococcal 13-valent conjugate vaccine (PREVNAR 13) SUSP injection; Inject 0.5 mLs into the muscle tomorrow at 10 am for 1 dose.  Dispense: 0.5 mL; Refill: 0  5. Mixed hyperlipidemia  Chronic   Cholesterol levels are slightly below normal  Will check levels and pending results will consider decreasing the frequency of him taking his statin - Lipid panel      [name], FNP    THE PATIENT IS ENCOURAGED TO PRACTICE SOCIAL DISTANCING DUE TO THE COVID-19 PANDEMIC.

## 2020-07-08 LAB — LIPID PANEL
Chol/HDL Ratio: 3 ratio (ref 0.0–5.0)
Cholesterol, Total: 95 mg/dL — ABNORMAL LOW (ref 100–199)
HDL: 32 mg/dL — ABNORMAL LOW (ref >39)
LDL Chol Calc (NIH): 39 mg/dL (ref 0–99)
Triglycerides: 139 mg/dL (ref 0–149)
VLDL Cholesterol Cal: 24 mg/dL (ref 5–40)

## 2020-07-08 LAB — HEMOGLOBIN A1C
Est. average glucose Bld gHb Est-mCnc: 126 mg/dL
Hgb A1c MFr Bld: 6 % — ABNORMAL HIGH (ref 4.8–5.6)

## 2020-07-08 LAB — CMP14 + ANION GAP
ALT: 19 IU/L (ref 0–44)
AST: 22 IU/L (ref 0–40)
Albumin/Globulin Ratio: 1.9 (ref 1.2–2.2)
Albumin: 4.2 g/dL (ref 3.8–4.8)
Alkaline Phosphatase: 53 IU/L (ref 48–121)
Anion Gap: 15 mmol/L (ref 10.0–18.0)
BUN/Creatinine Ratio: 10 (ref 10–24)
BUN: 47 mg/dL — ABNORMAL HIGH (ref 8–27)
Bilirubin Total: 0.4 mg/dL (ref 0.0–1.2)
CO2: 22 mmol/L (ref 20–29)
Calcium: 9.2 mg/dL (ref 8.6–10.2)
Chloride: 106 mmol/L (ref 96–106)
Creatinine, Ser: 4.64 mg/dL — ABNORMAL HIGH (ref 0.76–1.27)
GFR calc Af Amer: 14 mL/min/{1.73_m2} — ABNORMAL LOW (ref >59)
GFR calc non Af Amer: 12 mL/min/{1.73_m2} — ABNORMAL LOW (ref >59)
Globulin, Total: 2.2 g/dL (ref 1.5–4.5)
Glucose: 104 mg/dL — ABNORMAL HIGH (ref 65–99)
Potassium: 4.2 mmol/L (ref 3.5–5.2)
Sodium: 143 mmol/L (ref 134–144)
Total Protein: 6.4 g/dL (ref 6.0–8.5)

## 2020-08-09 ENCOUNTER — Other Ambulatory Visit: Payer: Self-pay | Admitting: Nurse Practitioner

## 2020-08-25 ENCOUNTER — Other Ambulatory Visit: Payer: Self-pay

## 2020-08-25 MED ORDER — AMLODIPINE BESYLATE 10 MG PO TABS: 10.0000 mg | ORAL_TABLET | Freq: Every day | ORAL | 0 refills | Status: DC

## 2020-08-30 ENCOUNTER — Other Ambulatory Visit: Payer: Self-pay | Admitting: Nurse Practitioner

## 2020-08-31 DIAGNOSIS — I639 Cerebral infarction, unspecified: Secondary | ICD-10-CM

## 2020-08-31 HISTORY — DX: Cerebral infarction, unspecified: I63.9

## 2020-09-20 ENCOUNTER — Other Ambulatory Visit: Payer: Self-pay | Admitting: Internal Medicine

## 2020-09-20 DIAGNOSIS — N401 Enlarged prostate with lower urinary tract symptoms: Secondary | ICD-10-CM

## 2020-09-21 ENCOUNTER — Other Ambulatory Visit: Payer: Self-pay | Admitting: Nurse Practitioner

## 2020-11-02 ENCOUNTER — Ambulatory Visit
Admission: RE | Admit: 2020-11-02 | Discharge: 2020-11-02 | Disposition: A | Discharge status: Home / Self Care | Payer: BC Managed Care – PPO | Source: Ambulatory Visit | Attending: Internal Medicine | Admitting: Internal Medicine

## 2020-11-02 DIAGNOSIS — N401 Enlarged prostate with lower urinary tract symptoms: Secondary | ICD-10-CM

## 2020-11-10 ENCOUNTER — Encounter: Payer: Self-pay | Admitting: Nurse Practitioner

## 2020-11-22 ENCOUNTER — Other Ambulatory Visit: Payer: Self-pay | Admitting: Nurse Practitioner

## 2020-11-24 IMAGING — DX LEFT SHOULDER - 2+ VIEW
3 series · 3 of 3 positions shown · non-contrast
Comparison: None.

CLINICAL DATA: Pain after fall 5 days ago.

EXAM:
LEFT SHOULDER - 2+ VIEW

[shoulder ap]
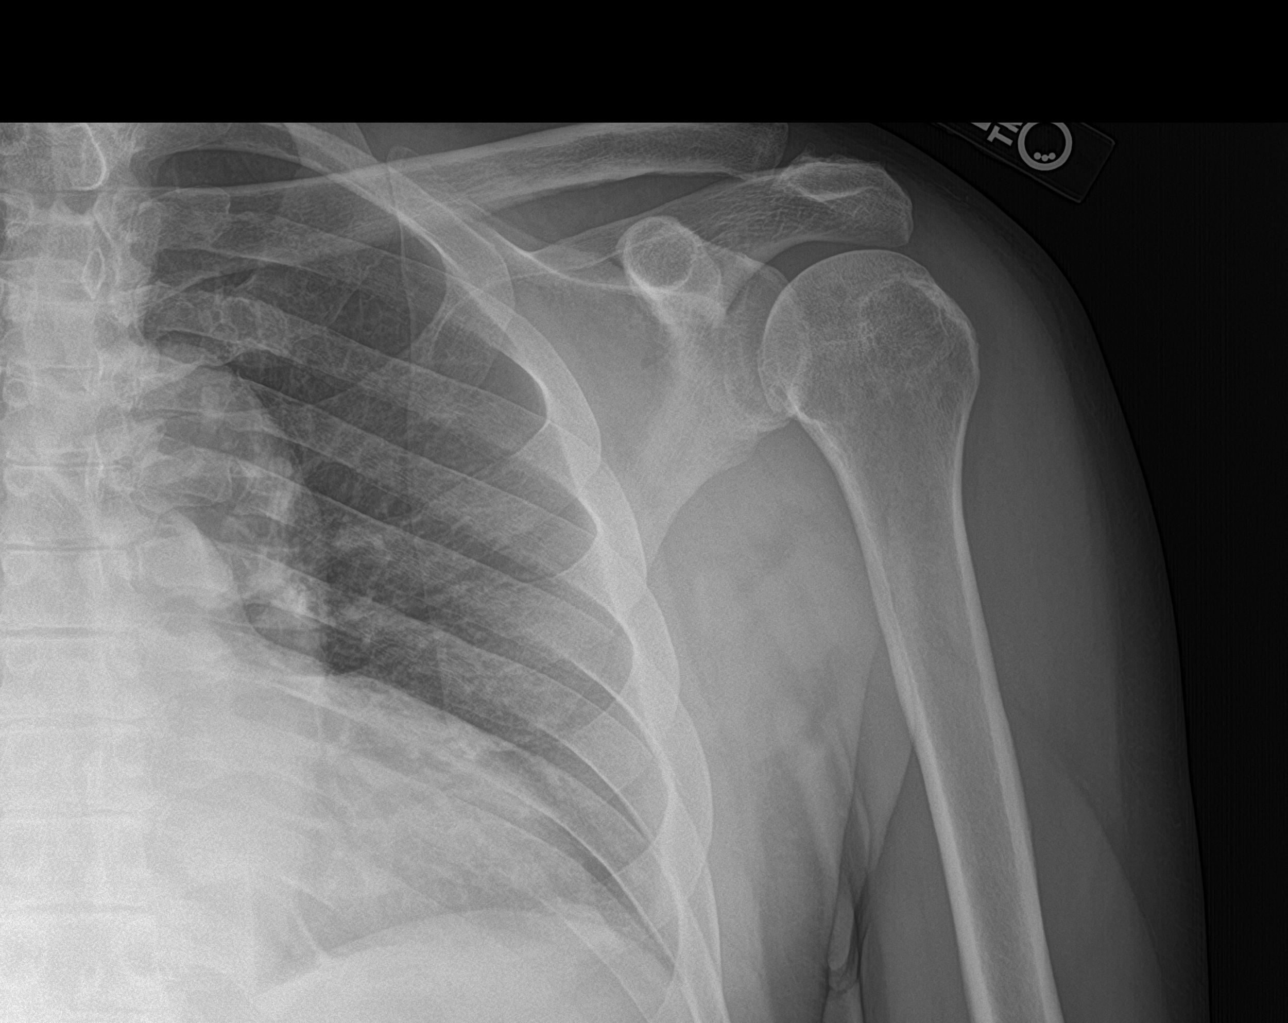

[shoulder grashey]
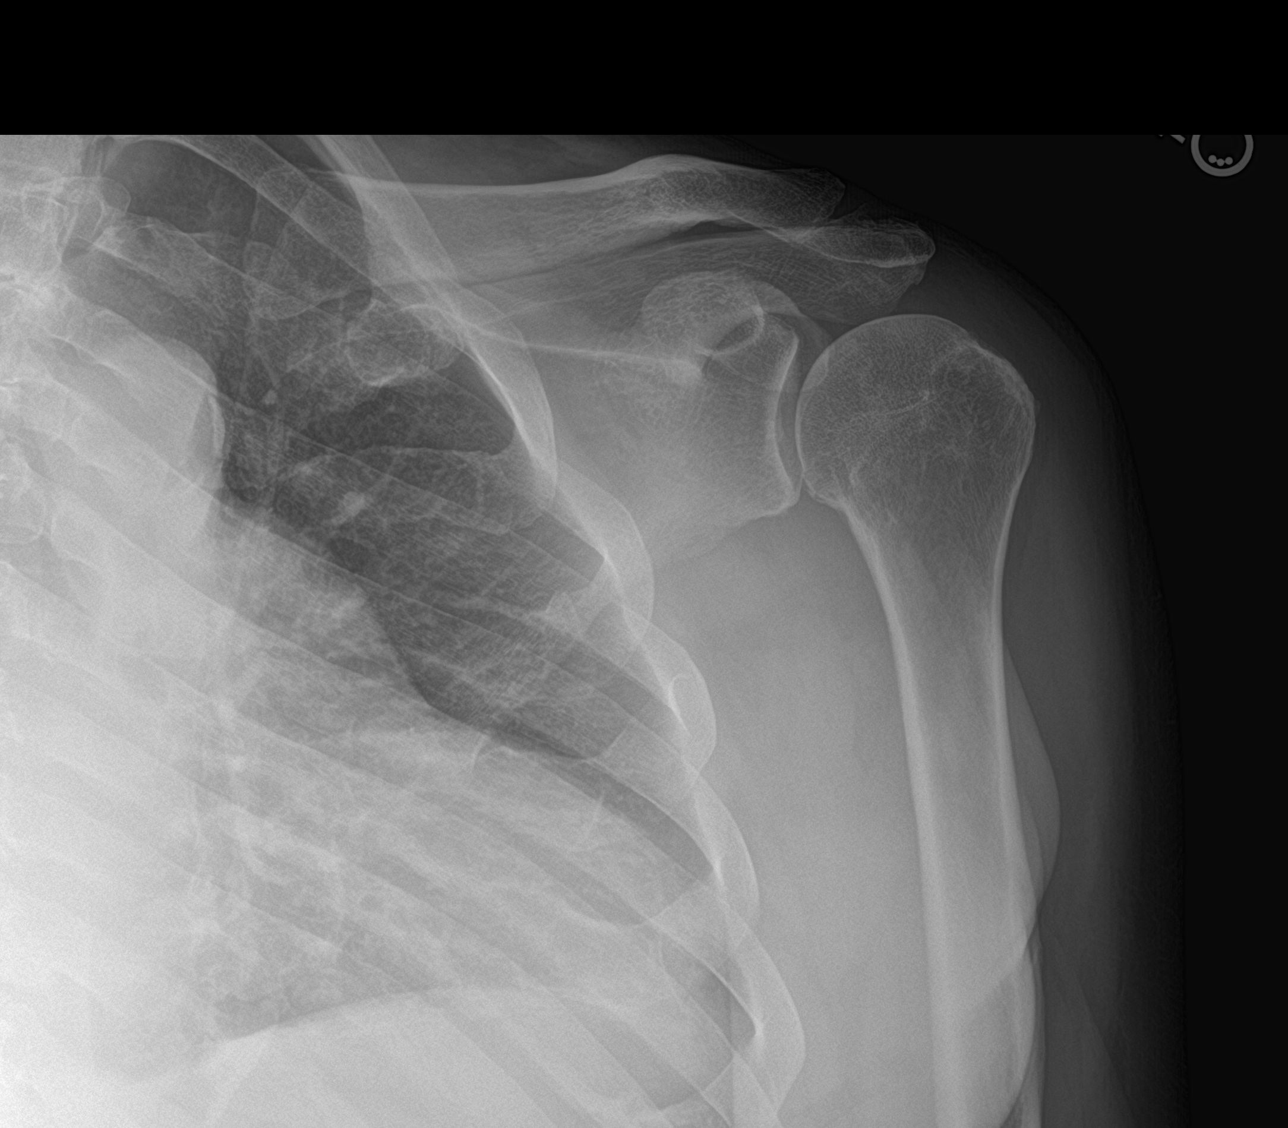

[shoulder y-view]
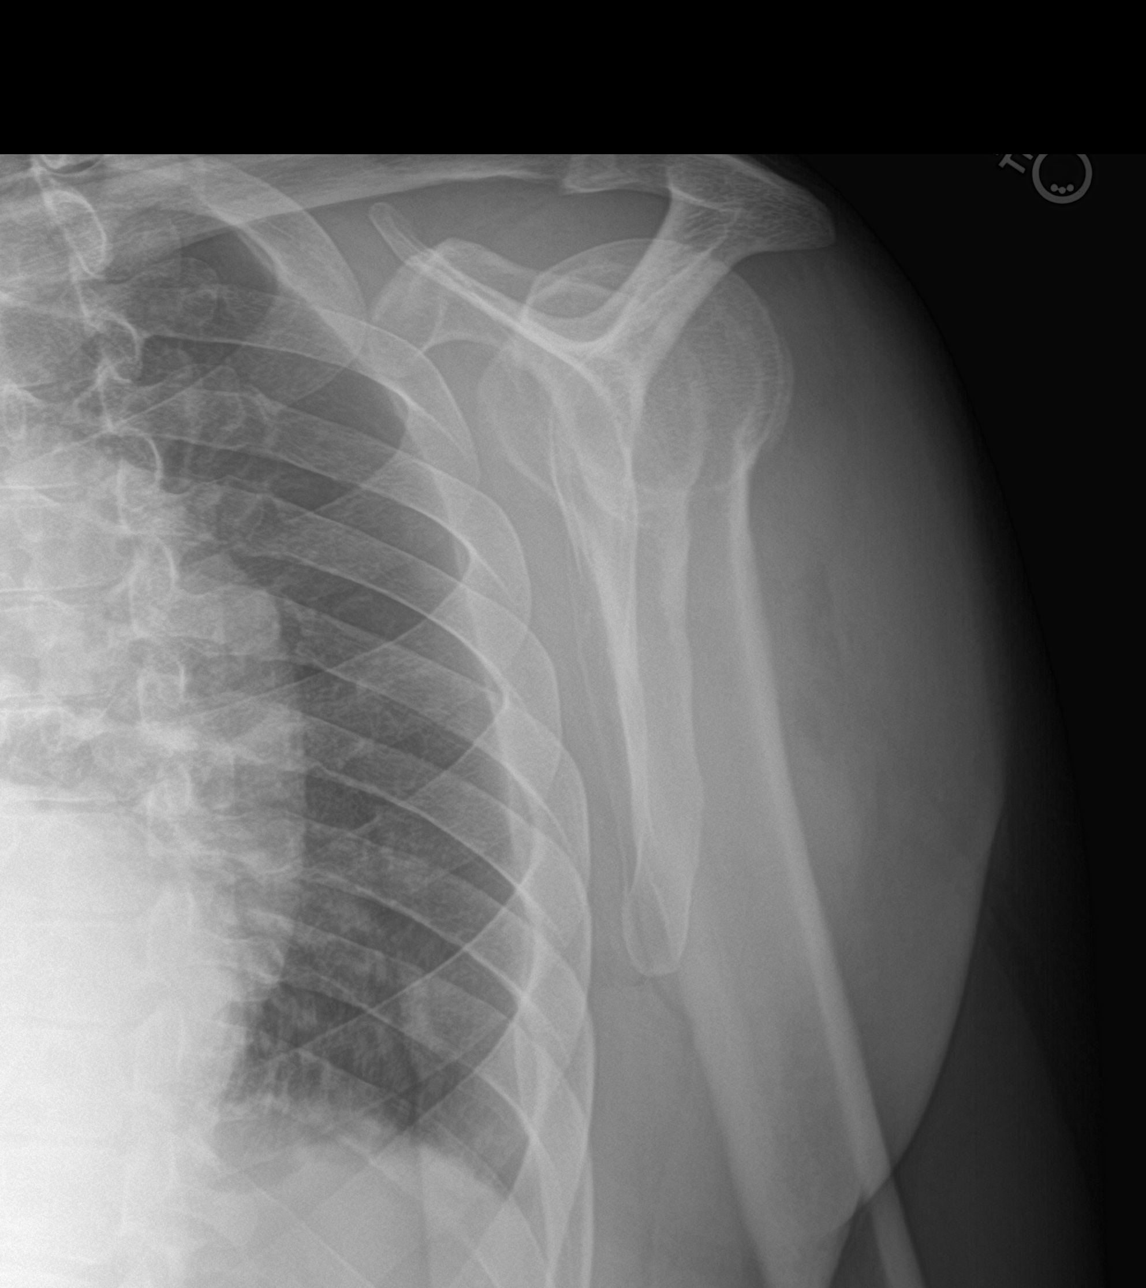

[3 of 3 positions shown; findings below may reference images not displayed]

FINDINGS: There is no evidence of fracture or dislocation. There is no
evidence of arthropathy or other focal bone abnormality. Soft
tissues are unremarkable.
IMPRESSION: Negative.

## 2020-11-29 IMAGING — MR MRI HEAD WITHOUT CONTRAST
12 of 13 series · 44 of 48 positions shown · non-contrast
Comparison: Head CT 07/20/2019

CLINICAL DATA: Left upper extremity weakness

EXAM:
MRI HEAD WITHOUT CONTRAST
TECHNIQUE: Multiplanar, multiecho pulse sequences of the brain and surrounding
structures were obtained without intravenous contrast.

[Series 5: DWI · axial · 3.0mm · 0.88mm/px · z∈[-57,+82]mm · 8 of 98 slices shown (1 of 4)]
[im 1/98]
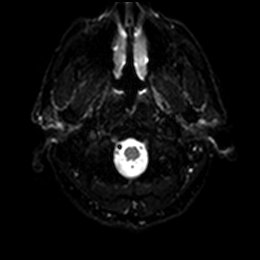
[im 14/98]
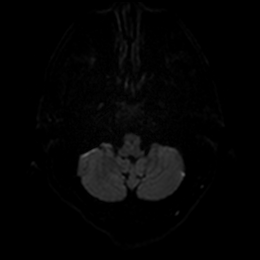
[im 28/98]
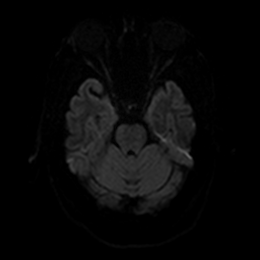
[im 42/98]
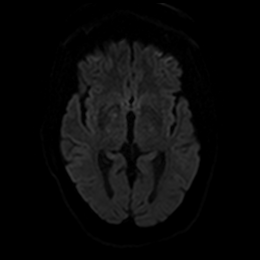
[im 56/98]
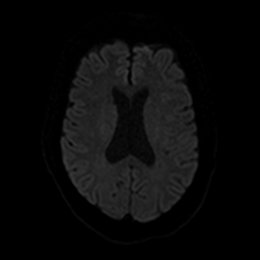
[im 70/98]
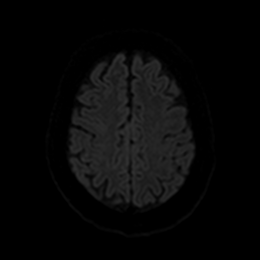
[im 84/98]
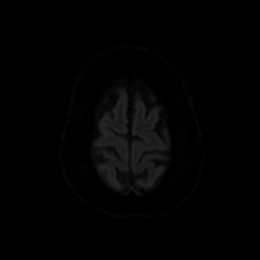
[im 98/98]
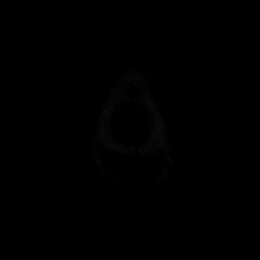

[Series 6: DWI · axial · 3.0mm · 0.88mm/px · z∈[-57,+82]mm · 4 of 49 slices shown (2 of 4)]
[im 1/49]
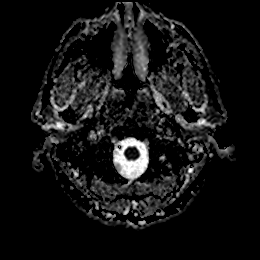
[im 17/49]
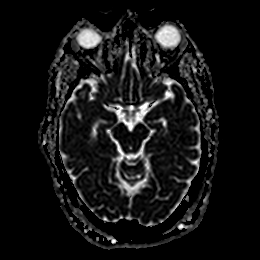
[im 33/49]
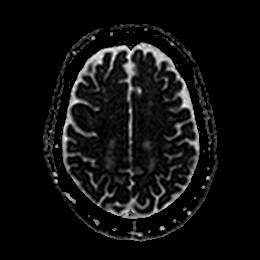
[im 49/49]
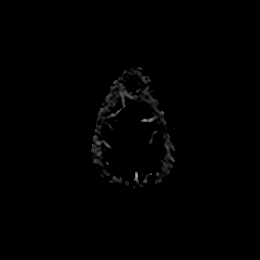

[Series 7: DWI · coronal · 4.0mm · 0.88mm/px · 5 of 70 slices shown (3 of 4)]
[im 1/70]
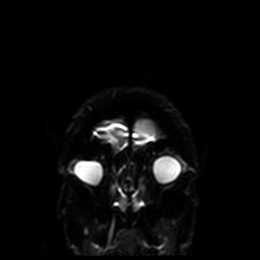
[im 18/70]
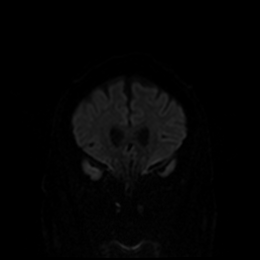
[im 35/70]
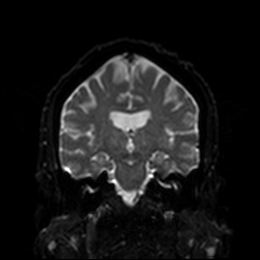
[im 52/70]
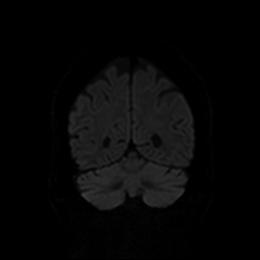
[im 70/70]
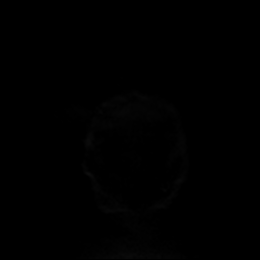

[Series 8: DWI · coronal · 4.0mm · 0.88mm/px · 3 of 35 slices shown (4 of 4)]
[im 1/35]
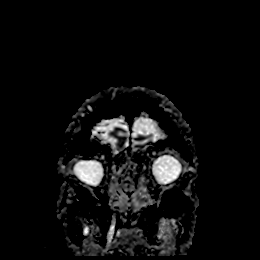
[im 18/35]
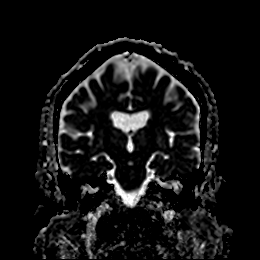
[im 35/35]
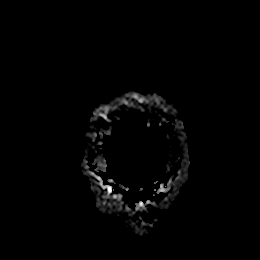

[Series 9: T1 · sagittal · 5.0mm · 0.75mm/px · 2 of 23 slices shown]
[im 1/23]
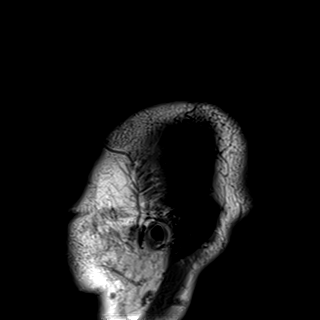
[im 23/23]
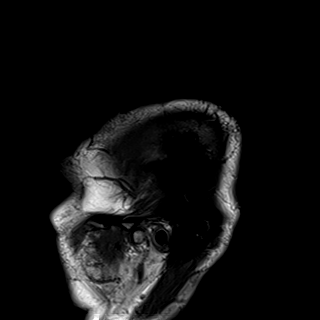

[Series 10: T2 · axial · 5.0mm · 0.72mm/px · z∈[-59,+80]mm · 2 of 25 slices shown (1 of 2)]
[im 1/25]
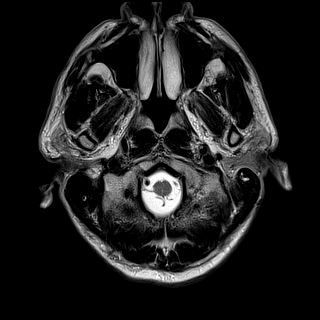
[im 25/25]
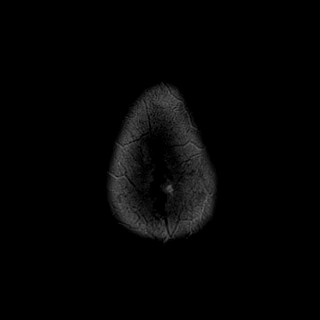

[Series 11: FLAIR · axial · 5.0mm · 0.45mm/px · z∈[-59,+79]mm · 2 of 25 slices shown]
[im 1/25]
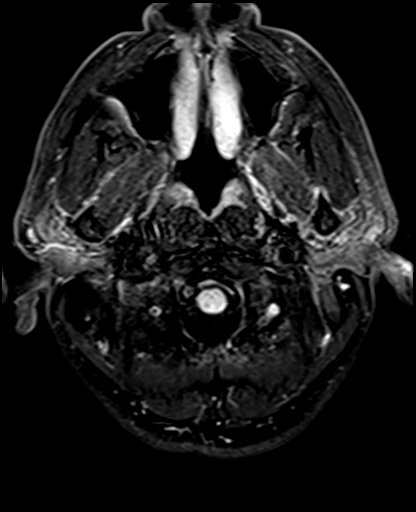
[im 25/25]
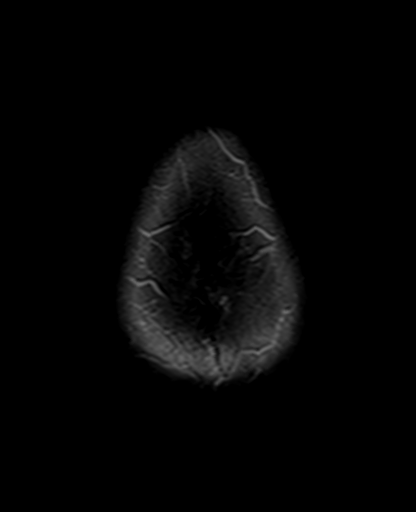

[Series 12: mag_images · axial · 3.0mm · 0.90mm/px · z∈[-73,+97]mm · 4 of 60 slices shown]
[im 1/60]
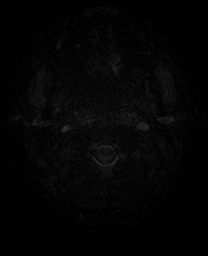
[im 20/60]
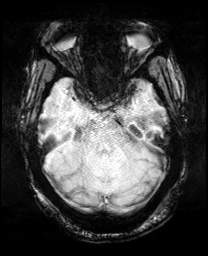
[im 40/60]
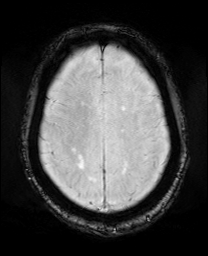
[im 60/60]
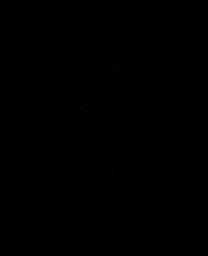

[Series 13: pha_images · axial · 3.0mm · 0.90mm/px · z∈[-73,+94]mm · 4 of 59 slices shown]
[im 1/59]
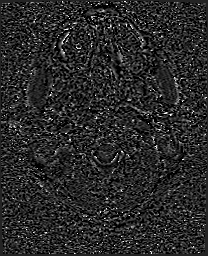
[im 20/59]
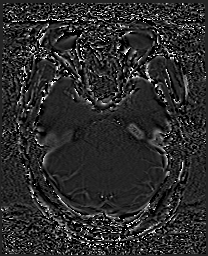
[im 39/59]
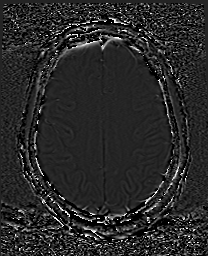
[im 59/59]
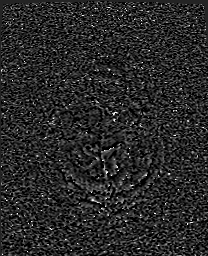

[Series 14: swi_images · axial · 3.0mm · 0.90mm/px · z∈[-73,+97]mm · 4 of 60 slices shown]
[im 1/60]
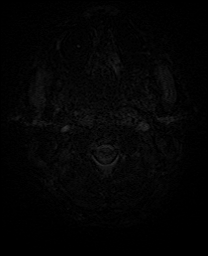
[im 20/60]
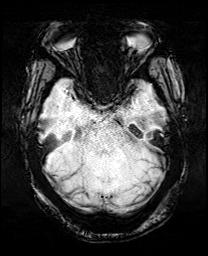
[im 40/60]
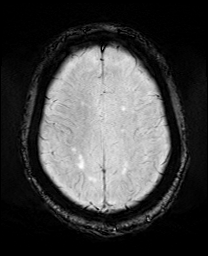
[im 60/60]
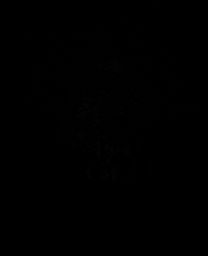

[Series 15: mip_images(sw) · axial · 24.0mm · 0.90mm/px · z∈[-63,+87]mm · 4 of 53 slices shown]
[im 1/53]
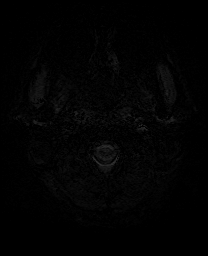
[im 18/53]
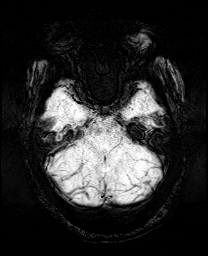
[im 35/53]
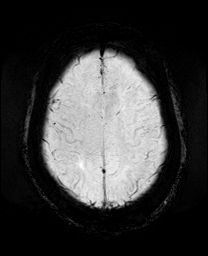
[im 53/53]
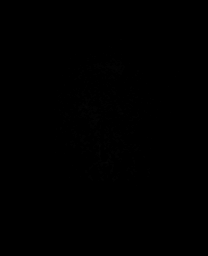

[Series 17: T2 · coronal · 5.0mm · 0.34mm/px · 2 of 29 slices shown (2 of 2)]
[im 1/29]
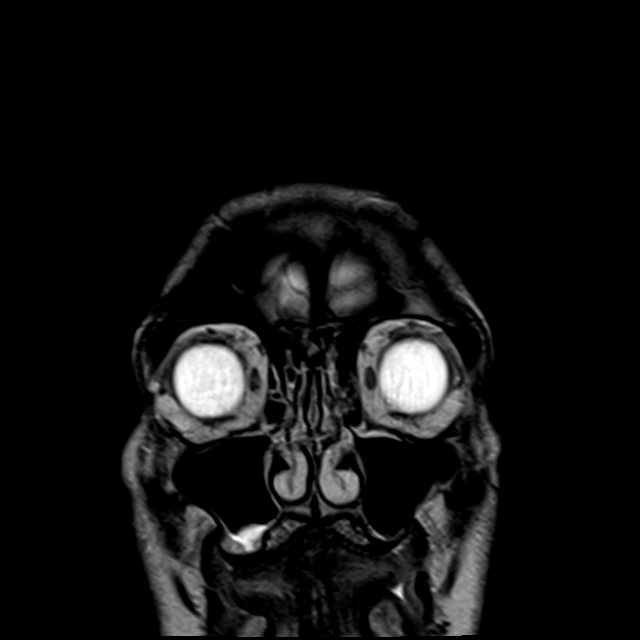
[im 29/29]
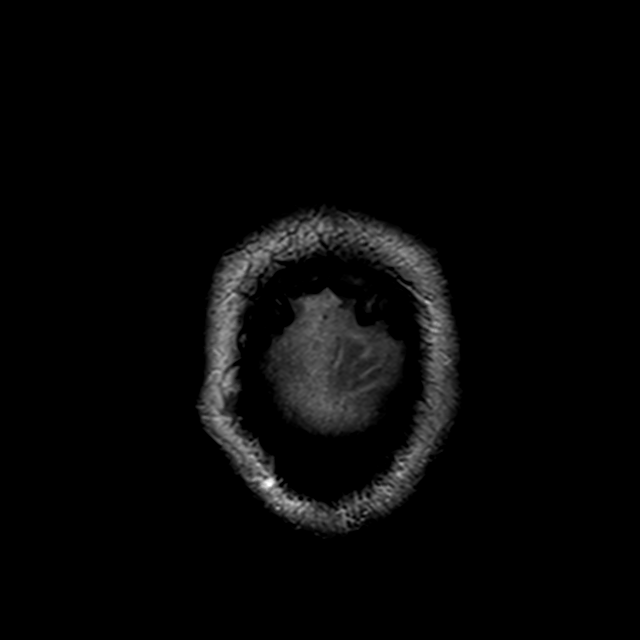

[44 of 48 positions shown; findings below may reference images not displayed]

FINDINGS: BRAIN: There is a small focus of abnormal diffusion restriction
within the dorsal right pons. There are other punctate foci of
diffusion abnormality within the medial right basal ganglia the
midline structures are normal. There is no midline shift or mass
effect. Early confluent hyperintense T2-weighted signal of the
periventricular and deep white matter, most commonly due to chronic
ischemic microangiopathy. The cerebral and cerebellar volume are
age-appropriate. There is no hydrocephalus. Susceptibility-sensitive
sequences show no chronic microhemorrhage or superficial siderosis.

VASCULAR: The major intracranial arterial and venous sinus flow
voids are normal.

SKULL AND UPPER CERVICAL SPINE: Calvarial bone marrow signal is
normal. There is no skull base mass. The visualized upper cervical
spine and soft tissues are normal.

SINUSES/ORBITS: There are no fluid levels or advanced mucosal
thickening. The mastoid air cells and middle ear cavities are free
of fluid. The orbits are normal.
IMPRESSION: 1. Small foci of acute ischemia within the dorsal right pons and
along the medial aspect of the right basal ganglia. These findings
are in keeping with reported left-sided weakness.
2. Chronic ischemic microangiopathy.
3. No acute hemorrhage.

## 2020-12-07 ENCOUNTER — Encounter: Payer: Self-pay | Admitting: Nurse Practitioner

## 2020-12-07 ENCOUNTER — Ambulatory Visit (INDEPENDENT_AMBULATORY_CARE_PROVIDER_SITE_OTHER): Payer: BC Managed Care – PPO | Admitting: Nurse Practitioner

## 2020-12-07 ENCOUNTER — Other Ambulatory Visit: Payer: Self-pay

## 2020-12-07 VITALS — BP 130/82 | HR 78 | Temp 98.4°F | Ht 68.8 in | Wt 209.6 lb

## 2020-12-07 DIAGNOSIS — H6122 Impacted cerumen, left ear: Secondary | ICD-10-CM

## 2020-12-07 DIAGNOSIS — N184 Chronic kidney disease, stage 4 (severe): Secondary | ICD-10-CM | POA: Diagnosis not present

## 2020-12-07 DIAGNOSIS — I129 Hypertensive chronic kidney disease with stage 1 through stage 4 chronic kidney disease, or unspecified chronic kidney disease: Secondary | ICD-10-CM | POA: Diagnosis not present

## 2020-12-07 DIAGNOSIS — Z23 Encounter for immunization: Secondary | ICD-10-CM

## 2020-12-07 DIAGNOSIS — Z0000 Encounter for general adult medical examination without abnormal findings: Secondary | ICD-10-CM

## 2020-12-07 DIAGNOSIS — Z125 Encounter for screening for malignant neoplasm of prostate: Secondary | ICD-10-CM

## 2020-12-07 DIAGNOSIS — Z720 Tobacco use: Secondary | ICD-10-CM

## 2020-12-07 DIAGNOSIS — I10 Essential (primary) hypertension: Secondary | ICD-10-CM

## 2020-12-07 DIAGNOSIS — R7309 Other abnormal glucose: Secondary | ICD-10-CM

## 2020-12-07 DIAGNOSIS — E782 Mixed hyperlipidemia: Secondary | ICD-10-CM | POA: Diagnosis not present

## 2020-12-07 LAB — POCT UA - MICROALBUMIN
Albumin/Creatinine Ratio, Urine, POC: 300
Creatinine, POC: 200 mg/dL
Microalbumin Ur, POC: 150 mg/L

## 2020-12-07 LAB — POCT URINALYSIS DIPSTICK
Bilirubin, UA: NEGATIVE
Glucose, UA: NEGATIVE
Ketones, UA: NEGATIVE
Nitrite, UA: NEGATIVE
Protein, UA: POSITIVE — AB
Spec Grav, UA: 1.02 (ref 1.010–1.025)
Urobilinogen, UA: 0.2 E.U./dL
pH, UA: 6 (ref 5.0–8.0)

## 2020-12-07 MED ORDER — PNEUMOCOCCAL 13-VAL CONJ VACC IM SUSP
0.5000 mL | INTRAMUSCULAR | 0 refills | Status: AC
Start: 2020-12-07 — End: 2020-12-07

## 2020-12-07 NOTE — Patient Instructions (Signed)

## 2020-12-07 NOTE — Progress Notes (Signed)
This visit occurred during the SARS-CoV-2 public health emergency.  Safety protocols were in place, including screening questions prior to the visit, additional usage of staff PPE, and extensive cleaning of exam room while observing appropriate contact time as indicated for disinfecting solutions.  Subjective:     Patient ID: Ryan Salazar , male    DOB: [birth year] , 69 y.o.   MRN: [id number]   Chief Complaint  Patient presents with  . Annual Exam    HPI  Here for HM.  He has been to his nephrologist and there was some discussion about a possible kidney transplant    Hypertension This is a chronic problem. The current episode started more than 1 year ago. The problem is unchanged. The problem is controlled. Pertinent negatives include no anxiety, blurred vision or headaches. There are no associated agents to hypertension. Risk factors for coronary artery disease include male gender, sedentary lifestyle and smoking/tobacco exposure. Past treatments include diuretics. There are no compliance problems.  Identifiable causes of hypertension include chronic renal disease (stage 4).     Past Medical History:  Diagnosis Date  . Blood transfusion 1989  . GERD (gastroesophageal reflux disease)   . Hypertension   . Ulcer 1989   BLEEDING STOMACH ULCER     Family History  Problem Relation Age of Onset  . Stroke Mother   . Healthy Father   . Colon cancer Neg Hx   . Colon polyps Neg Hx      Current Outpatient Medications:  .  amLODipine (NORVASC) 10 MG tablet, Take 1 tablet (10 mg total) by mouth daily., Disp: 90 tablet, Rfl: 0 .  aspirin EC 81 MG EC tablet, Take 1 tablet (81 mg total) by mouth daily., Disp: 30 tablet, Rfl: 1 .  atorvastatin (LIPITOR) 40 MG tablet, TAKE 1 TABLET (40 MG TOTAL) BY MOUTH DAILY AT 6 PM., Disp: 90 tablet, Rfl: 1 .  calcitRIOL (ROCALTROL) 0.25 MCG capsule, Take 0.25 mcg by mouth daily., Disp: , Rfl:  .  doxazosin (CARDURA) 4 MG tablet, TAKE 1 TABLET BY MOUTH  TWICE A DAY, Disp: 180 tablet, Rfl: 0 .  furosemide (LASIX) 80 MG tablet, Take 1 tablet by mouth 2 (two) times daily. , Disp: , Rfl:  .  hydrALAZINE (APRESOLINE) 25 MG tablet, Take 1 tablet (25 mg total) by mouth 3 (three) times daily., Disp: 120 tablet, Rfl: 0 .  KLOR-CON M20 20 MEQ tablet, Take 20 mEq by mouth Daily. , Disp: , Rfl:  .  labetalol (NORMODYNE) 300 MG tablet, TAKE 1 TABLET BY MOUTH TWICE A DAY, Disp: 180 tablet, Rfl: 1 .  omeprazole (PRILOSEC) 20 MG capsule, Take 1 tablet by mouth Twice daily., Disp: , Rfl:    No Known Allergies   Men's preventive visit. Patient Health Questionnaire (PHQ-2) is  [place] Office Visit from 04/06/2020 in [facility]  PHQ-2 Total Score 0     Patient is on a regular diet. Marital status: Single. Relevant history for alcohol use is:  Social History   Substance and Sexual Activity  Alcohol Use Yes  . Relevant history for tobacco use is:  Social History   Tobacco Use  Smoking Status Current Some Day Smoker  . Packs/day: 0.50  . Years: 50.00  . Pack years: 25.00  . Types: Cigarettes  Smokeless Tobacco Never Used  Tobacco Comment   1 PACK  EVERY 3 DAYS, states "puffing" and "they don't taste good"  .   Review of Systems  Constitutional: Negative.   HENT: Negative.   Eyes: Negative.  Negative for blurred vision.  Respiratory: Negative.   Cardiovascular: Negative.   Gastrointestinal: Negative.   Endocrine: Negative.   Genitourinary: Negative.   Musculoskeletal: Negative.   Skin: Negative.   Neurological: Negative for headaches.  Hematological: Negative.   Psychiatric/Behavioral: Negative.      Today's Vitals   12/07/20 1447  BP: 130/82  Pulse: 78  Temp: 98.4 F (36.9 C)  TempSrc: Oral  Weight: 209 lb 9.6 oz (95.1 kg)  Height: 5' 8.8" (1.748 m)  PainSc: 0-No pain   Body mass index is 31.13 kg/m.   Objective:  Physical Exam Vitals reviewed.  Constitutional:      Appearance: Normal  appearance. He is obese.  HENT:     Head: Normocephalic and atraumatic.     Right Ear: Tympanic membrane, ear canal and external ear normal. There is no impacted cerumen.     Left Ear: External ear normal. There is impacted cerumen (firm cerumen present).  Cardiovascular:     Rate and Rhythm: Normal rate and regular rhythm.     Pulses: Normal pulses.     Heart sounds: Normal heart sounds. No murmur heard.   Pulmonary:     Effort: Pulmonary effort is normal. No respiratory distress.     Breath sounds: Normal breath sounds.  Abdominal:     General: Abdomen is flat. Bowel sounds are normal. There is no distension.     Palpations: Abdomen is soft.  Genitourinary:    Prostate: Normal.     Rectum: Guaiac result negative (negative).  Musculoskeletal:        General: Normal range of motion.     Cervical back: Normal range of motion and neck supple.  Skin:    General: Skin is warm.     Capillary Refill: Capillary refill takes less than 2 seconds.  Neurological:     General: No focal deficit present.     Mental Status: He is alert and oriented to person, place, and time.  Psychiatric:        Mood and Affect: Mood normal.        Behavior: Behavior normal.        Thought Content: Thought content normal.        Judgment: Judgment normal.         Assessment And Plan:    1. Encounter for general adult medical examination w/o abnormal findings . Behavior modifications discussed and diet history reviewed.   . Pt will continue to exercise regularly and modify diet with low GI, plant based foods and decrease intake of processed foods.  . Recommend intake of daily multivitamin, Vitamin D, and calcium.  . Recommend for preventive screenings, as well as recommend immunizations that include influenza, TDAP  2. Essential hypertension  Chronic, fair control  Continue follow up with cardiology  Urine micro is abnormal - POCT Urinalysis Dipstick (81002) - POCT UA - Microalbumin  3. Mixed  hyperlipidemia  Chronic, controlled  Continue with current medications, tolerating medications well - Lipid panel  4. Abnormal glucose  Chronic, controlled  no current medications, diet and exercise controlled  Encouraged to limit intake of sugary foods and drinks  Encouraged to increase physical activity to 150 minutes per week as tolerated  5. CKD (chronic kidney disease) stage 4, GFR 15-29 ml/min (HCC)  Continue follow up with Nephrology and keep [place] posted about possible kidney transplant  6. Tobacco abuse  Will check low dose CT scan  continues to smoke  - CT CHEST LUNG CA SCREEN LOW DOSE W/O CM; Future  7. Encounter for immunization - pneumococcal 13-valent conjugate vaccine (PREVNAR 13) SUSP injection; Inject 0.5 mLs into the muscle tomorrow at 10 am for 1 dose.  Dispense: 0.5 mL; Refill: 0  8. Encounter for prostate cancer screening - PSA  9. Impacted cerumen of left ear  Water lavage done with good results     Patient was given opportunity to ask questions. Patient verbalized understanding of the plan and was able to repeat key elements of the plan. All questions were answered to their satisfaction.   [name], FNP   I, [name], FNP, have reviewed all documentation for this visit. The documentation on 12/25/20 for the exam, diagnosis, procedures, and orders are all accurate and complete.   THE PATIENT IS ENCOURAGED TO PRACTICE SOCIAL DISTANCING DUE TO THE COVID-19 PANDEMIC.

## 2020-12-08 LAB — LIPID PANEL
Chol/HDL Ratio: 2.7 ratio (ref 0.0–5.0)
Cholesterol, Total: 99 mg/dL — ABNORMAL LOW (ref 100–199)
HDL: 37 mg/dL — ABNORMAL LOW (ref >39)
LDL Chol Calc (NIH): 43 mg/dL (ref 0–99)
Triglycerides: 98 mg/dL (ref 0–149)
VLDL Cholesterol Cal: 19 mg/dL (ref 5–40)

## 2020-12-08 LAB — PSA: Prostate Specific Ag, Serum: 5.1 ng/mL — ABNORMAL HIGH (ref 0.0–4.0)

## 2020-12-15 ENCOUNTER — Other Ambulatory Visit: Payer: Self-pay | Admitting: *Deleted

## 2020-12-15 DIAGNOSIS — N184 Chronic kidney disease, stage 4 (severe): Secondary | ICD-10-CM

## 2020-12-26 ENCOUNTER — Other Ambulatory Visit: Payer: Self-pay | Admitting: Nurse Practitioner

## 2020-12-26 ENCOUNTER — Telehealth: Payer: Self-pay

## 2020-12-26 DIAGNOSIS — R972 Elevated prostate specific antigen [PSA]: Secondary | ICD-10-CM

## 2020-12-26 MED ORDER — DOXYCYCLINE MONOHYDRATE 100 MG PO TABS: 100.0000 mg | ORAL_TABLET | Freq: Two times a day (BID) | ORAL | 0 refills | Status: DC

## 2020-12-26 NOTE — Telephone Encounter (Signed)
-----   Message from [name], [place] sent at 12/26/2020  1:58 PM EST ----- I will treat him with doxycycline 2 times a day for 5 days then he is to have another PSA level checked.  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 urology previously?

## 2020-12-26 NOTE — Telephone Encounter (Signed)
Left a message for the pt to call the office back.

## 2020-12-26 NOTE — Progress Notes (Signed)
I will treat him with doxycycline 2 times a day for 5 days then he is to have another PSA level checked.  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 urology previously?

## 2021-01-02 ENCOUNTER — Other Ambulatory Visit: Payer: Medicare Other

## 2021-01-02 ENCOUNTER — Other Ambulatory Visit: Payer: Self-pay | Admitting: Nurse Practitioner

## 2021-01-02 ENCOUNTER — Other Ambulatory Visit: Payer: Self-pay

## 2021-01-02 DIAGNOSIS — R972 Elevated prostate specific antigen [PSA]: Secondary | ICD-10-CM

## 2021-01-03 ENCOUNTER — Encounter: Payer: BC Managed Care – PPO | Admitting: Vascular Surgery

## 2021-01-03 ENCOUNTER — Telehealth: Payer: Self-pay

## 2021-01-03 ENCOUNTER — Other Ambulatory Visit (HOSPITAL_COMMUNITY): Payer: BC Managed Care – PPO

## 2021-01-03 ENCOUNTER — Encounter (HOSPITAL_COMMUNITY): Payer: BC Managed Care – PPO

## 2021-01-03 LAB — PSA: Prostate Specific Ag, Serum: 4.3 ng/mL — ABNORMAL HIGH (ref 0.0–4.0)

## 2021-01-03 NOTE — Telephone Encounter (Signed)
Left the patient a message to call back for lab results. 

## 2021-01-03 NOTE — Telephone Encounter (Signed)
-----   Message from [name], [place] sent at 01/03/2021  9:00 AM EST ----- Your PSA is slightly better but you are still on the medication so this may improve more, we can recheck in a few weeks after completing your medications before referring you to a Urologist.

## 2021-01-09 NOTE — Progress Notes (Signed)
He can come back one week from today

## 2021-01-10 ENCOUNTER — Other Ambulatory Visit: Payer: Self-pay | Admitting: Nurse Practitioner

## 2021-01-16 ENCOUNTER — Other Ambulatory Visit: Payer: Self-pay

## 2021-01-16 ENCOUNTER — Encounter (HOSPITAL_COMMUNITY): Payer: BC Managed Care – PPO

## 2021-01-16 ENCOUNTER — Other Ambulatory Visit (HOSPITAL_COMMUNITY): Payer: BC Managed Care – PPO

## 2021-01-16 ENCOUNTER — Encounter: Payer: BC Managed Care – PPO | Admitting: Surgery

## 2021-01-17 ENCOUNTER — Other Ambulatory Visit: Payer: Self-pay

## 2021-02-13 ENCOUNTER — Other Ambulatory Visit: Payer: Self-pay | Admitting: Nurse Practitioner

## 2021-02-15 ENCOUNTER — Other Ambulatory Visit: Payer: Self-pay | Admitting: Nurse Practitioner

## 2021-03-27 ENCOUNTER — Other Ambulatory Visit: Payer: Self-pay

## 2021-03-27 DIAGNOSIS — N184 Chronic kidney disease, stage 4 (severe): Secondary | ICD-10-CM

## 2021-04-03 ENCOUNTER — Ambulatory Visit: Payer: BC Managed Care – PPO | Admitting: Surgery

## 2021-04-03 ENCOUNTER — Ambulatory Visit (INDEPENDENT_AMBULATORY_CARE_PROVIDER_SITE_OTHER)
Admission: RE | Admit: 2021-04-03 | Discharge: 2021-04-03 | Disposition: A | Discharge status: Home / Self Care | Payer: BC Managed Care – PPO | Source: Ambulatory Visit | Attending: Surgery | Admitting: Surgery

## 2021-04-03 ENCOUNTER — Ambulatory Visit (HOSPITAL_COMMUNITY)
Admission: RE | Admit: 2021-04-03 | Discharge: 2021-04-03 | Disposition: A | Discharge status: Home / Self Care | Payer: BC Managed Care – PPO | Source: Ambulatory Visit | Attending: Surgery | Admitting: Surgery

## 2021-04-03 ENCOUNTER — Encounter: Payer: Self-pay | Admitting: Surgery

## 2021-04-03 ENCOUNTER — Other Ambulatory Visit: Payer: Self-pay

## 2021-04-03 VITALS — BP 139/77 | HR 68 | Temp 98.3°F | Resp 20 | Ht 68.5 in | Wt 213.0 lb

## 2021-04-03 DIAGNOSIS — N184 Chronic kidney disease, stage 4 (severe): Secondary | ICD-10-CM

## 2021-04-03 DIAGNOSIS — N185 Chronic kidney disease, stage 5: Secondary | ICD-10-CM

## 2021-04-03 NOTE — Progress Notes (Signed)
[facility] [place]  Patient name: Ryan Salazar MRN: [id number] DOB: 30-Jul-1951 Sex: male   REQUESTING PROVIDER:    Dr. Johnney Ou   REASON FOR CONSULT:    Dialysis access  HISTORY OF PRESENT ILLNESS:   Ryan Salazar is a 70 y.o. male, who is referred for evaluation for dialysis access.  He is not yet on dialysis.  His renal function is around 10%.  His renal failure secondary to hypertension.  He does have a brother who has been on dialysis.  He has an appointment to be evaluated for transplant at [facility].  He does not have a pacemaker or defibrillator.  He is right-handed.  The patient has a history of stroke which is left him with some residual left-sided numbness.  He is a smoker.  PAST MEDICAL HISTORY    Past Medical History:  Diagnosis Date  . Blood transfusion 1989  . Chronic kidney disease   . GERD (gastroesophageal reflux disease)   . Hypertension   . Ulcer 1989   BLEEDING STOMACH ULCER     FAMILY HISTORY   Family History  Problem Relation Age of Onset  . Stroke Mother   . Healthy Father   . Colon cancer Neg Hx   . Colon polyps Neg Hx     SOCIAL HISTORY:   Social History   Socioeconomic History  . Marital status: Single    Spouse name: Not on file  . Number of children: Not on file  . Years of education: Not on file  . Highest education level: Not on file  Occupational History  . Not on file  Tobacco Use  . Smoking status: Current Some Day Smoker    Packs/day: 0.50    Years: 50.00    Pack years: 25.00    Types: Cigarettes  . Smokeless tobacco: Never Used  . Tobacco comment: 1 PACK  EVERY 3 DAYS, states "puffing" and "they don't taste good"  Vaping Use  . Vaping Use: Never used  Substance and Sexual Activity  . Alcohol use: Yes  . Drug use: Yes    Types: Marijuana    Comment: rarely  . Sexual activity: Not on file  Other Topics Concern  . Not on file  Social History Narrative  . Not  on file   Social Determinants of Health   Financial Resource Strain: Not on file  Food Insecurity: Not on file  Transportation Needs: Not on file  Physical Activity: Not on file  Stress: Not on file  Social Connections: Not on file  Intimate Partner Violence: Not on file    ALLERGIES:    No Known Allergies  CURRENT MEDICATIONS:    Current Outpatient Medications  Medication Sig Dispense Refill  . amLODipine (NORVASC) 10 MG tablet TAKE 1 TABLET BY MOUTH EVERY DAY 90 tablet 0  . aspirin EC 81 MG EC tablet Take 1 tablet (81 mg total) by mouth daily. 30 tablet 1  . atorvastatin (LIPITOR) 40 MG tablet TAKE 1 TABLET (40 MG TOTAL) BY MOUTH DAILY AT 6 PM. 90 tablet 1  . calcitRIOL (ROCALTROL) 0.25 MCG capsule Take 0.25 mcg by mouth daily.    Marland Kitchen doxazosin (CARDURA) 4 MG tablet TAKE 1 TABLET BY MOUTH TWICE A DAY 180 tablet 0  . furosemide (LASIX) 80 MG tablet Take 1 tablet by mouth 2 (two) times daily.     . hydrALAZINE (APRESOLINE) 25 MG tablet Take 1 tablet (25 mg total) by mouth 3 (three) times  daily. 120 tablet 0  . KLOR-CON M20 20 MEQ tablet Take 20 mEq by mouth Daily.     Marland Kitchen labetalol (NORMODYNE) 300 MG tablet TAKE 1 TABLET BY MOUTH TWICE A DAY 180 tablet 1  . omeprazole (PRILOSEC) 20 MG capsule Take 1 tablet by mouth Twice daily.     No current facility-administered medications for this visit.    REVIEW OF SYSTEMS:   '[X]'$  denotes positive finding, '[ ]'$  denotes negative finding Cardiac  Comments:  Chest pain or chest pressure:    Shortness of breath upon exertion:    Short of breath when lying flat:    Irregular heart rhythm:        Vascular    Pain in calf, thigh, or hip brought on by ambulation:    Pain in feet at night that wakes you up from your sleep:     Blood clot in your veins:    Leg swelling:         Pulmonary    Oxygen at home:    Productive cough:     Wheezing:         Neurologic    Sudden weakness in arms or legs:     Sudden numbness in arms or legs:      Sudden onset of difficulty speaking or slurred speech:    Temporary loss of vision in one eye:     Problems with dizziness:         Gastrointestinal    Blood in stool:      Vomited blood:         Genitourinary    Burning when urinating:     Blood in urine:        Psychiatric    Major depression:         Hematologic    Bleeding problems:    Problems with blood clotting too easily:        Skin    Rashes or ulcers:        Constitutional    Fever or chills:     PHYSICAL EXAM:   Vitals:   04/03/21 1433  BP: 139/77  Pulse: 68  Resp: 20  Temp: 98.3 F (36.8 C)  SpO2: 97%  Weight: 213 lb (96.6 kg)  Height: 5' 8.5" (1.74 m)    GENERAL: The patient is a well-nourished male, in no acute distress. The vital signs are documented above. CARDIAC: There is a regular rate and rhythm.  VASCULAR: Palpable right radial pulse PULMONARY: Nonlabored respirations MUSCULOSKELETAL: There are no major deformities or cyanosis. SKIN: There are no ulcers or rashes noted. PSYCHIATRIC: The patient has a normal affect.  STUDIES:   I have reviewed the following:  Arterial:  Left Pre-Dialysis Findings:  +-----------------------+----------+--------------------+---------+--------  +  Location        PSV (cm/s)Intralum. Diam. (cm)Waveform  Comments  +-----------------------+----------+--------------------+---------+--------  +  Brachial Antecub. fossa90    0.60        triphasic        +-----------------------+----------+--------------------+---------+--------  +  Radial Art at Wrist  81    0.33        triphasic        +-----------------------+----------+--------------------+---------+--------  +  Ulnar Art at Wrist   68    0.24        triphasic        +-----------------------+----------+--------------------+---------+--------  +  +-----------------+-------------+----------+---------+  Right  Cephalic  Diameter (cm)Depth (cm)Findings   +-----------------+-------------+----------+---------+  Shoulder  0.42     0.97         +-----------------+-------------+----------+---------+  Prox upper arm    0.39     0.48         +-----------------+-------------+----------+---------+  Mid upper arm    0.33     0.45         +-----------------+-------------+----------+---------+  Dist upper arm    0.38     0.25         +-----------------+-------------+----------+---------+  Antecubital fossa  0.54     0.22         +-----------------+-------------+----------+---------+  Prox forearm     0.17     0.42  branching  +-----------------+-------------+----------+---------+  Mid forearm     0.21     0.33         +-----------------+-------------+----------+---------+  Dist forearm     0.16     0.43         +-----------------+-------------+----------+---------+  Wrist        0.14     0.38         +-----------------+-------------+----------+---------+   +-----------------+-------------+----------+---------+  Right Basilic  Diameter (cm)Depth (cm)Findings   +-----------------+-------------+----------+---------+  Mid upper arm    0.34     0.52         +-----------------+-------------+----------+---------+  Dist upper arm    0.37     0.52         +-----------------+-------------+----------+---------+  Antecubital fossa  0.38     0.37         +-----------------+-------------+----------+---------+  Prox forearm     0.23     0.21  branching  +-----------------+-------------+----------+---------+  Mid forearm     0.18     0.27  branching  +-----------------+-------------+----------+---------+  Distal forearm    0.12     0.29          +-----------------+-------------+----------+---------+   +-----------------+-------------+----------+---------+  Left Cephalic  Diameter (cm)Depth (cm)Findings   +-----------------+-------------+----------+---------+  Shoulder       0.26     0.57         +-----------------+-------------+----------+---------+  Prox upper arm    0.25     0.33         +-----------------+-------------+----------+---------+  Mid upper arm    0.28     0.32         +-----------------+-------------+----------+---------+  Dist upper arm    0.29     0.38  branching  +-----------------+-------------+----------+---------+  Antecubital fossa  0.20     0.38         +-----------------+-------------+----------+---------+  Prox forearm     0.20     0.29         +-----------------+-------------+----------+---------+  Mid forearm     0.11     0.18         +-----------------+-------------+----------+---------+  Dist forearm     0.11     0.18         +-----------------+-------------+----------+---------+   +-----------------+-------------+----------+---------+  Left Basilic   Diameter (cm)Depth (cm)Findings   +-----------------+-------------+----------+---------+  Prox upper arm    0.27     0.58         +-----------------+-------------+----------+---------+  Mid upper arm    0.31     0.67         +-----------------+-------------+----------+---------+  Dist upper arm    0.33     0.41         +-----------------+-------------+----------+---------+  Antecubital fossa  0.26     0.41  branching  +-----------------+-------------+----------+---------+  Prox forearm  0.20     0.22         +-----------------+-------------+----------+---------+  Mid forearm     0.22     0.20          +-----------------+-------------+----------+---------+  Distal forearm    0.18     0.22         +-----------------+-------------+----------+---------+  Wrist        0.13     0.22         +-----------------+-------------+----------+---------+   ASSESSMENT and PLAN   CKD 5: Based off of vein mapping, I think the patient's best option is a right brachiocephalic fistula.  I discussed the details of the operation including the risks and benefits, not limited to bleeding and steal syndrome.  All of his questions have been answered.  We will schedule him in the immediate future.  He would like to further discuss this with Dr. Johnney Ou.   Leia Alf, MD, FACS [facility] [phone] Pager [phone]

## 2021-04-03 NOTE — H&P (View-Only) (Signed)
[facility] [place]  Patient name: Ryan Salazar MRN: [id number] DOB: Jan 18, 1951 Sex: male   REQUESTING PROVIDER:    Dr. Johnney Ou   REASON FOR CONSULT:    Dialysis access  HISTORY OF PRESENT ILLNESS:   Ryan Salazar is a 70 y.o. male, who is referred for evaluation for dialysis access.  He is not yet on dialysis.  His renal function is around 10%.  His renal failure secondary to hypertension.  He does have a brother who has been on dialysis.  He has an appointment to be evaluated for transplant at [facility].  He does not have a pacemaker or defibrillator.  He is right-handed.  The patient has a history of stroke which is left him with some residual left-sided numbness.  He is a smoker.  PAST MEDICAL HISTORY    Past Medical History:  Diagnosis Date  . Blood transfusion 1989  . Chronic kidney disease   . GERD (gastroesophageal reflux disease)   . Hypertension   . Ulcer 1989   BLEEDING STOMACH ULCER     FAMILY HISTORY   Family History  Problem Relation Age of Onset  . Stroke Mother   . Healthy Father   . Colon cancer Neg Hx   . Colon polyps Neg Hx     SOCIAL HISTORY:   Social History   Socioeconomic History  . Marital status: Single    Spouse name: Not on file  . Number of children: Not on file  . Years of education: Not on file  . Highest education level: Not on file  Occupational History  . Not on file  Tobacco Use  . Smoking status: Current Some Day Smoker    Packs/day: 0.50    Years: 50.00    Pack years: 25.00    Types: Cigarettes  . Smokeless tobacco: Never Used  . Tobacco comment: 1 PACK  EVERY 3 DAYS, states "puffing" and "they don't taste good"  Vaping Use  . Vaping Use: Never used  Substance and Sexual Activity  . Alcohol use: Yes  . Drug use: Yes    Types: Marijuana    Comment: rarely  . Sexual activity: Not on file  Other Topics Concern  . Not on file  Social History Narrative  . Not  on file   Social Determinants of Health   Financial Resource Strain: Not on file  Food Insecurity: Not on file  Transportation Needs: Not on file  Physical Activity: Not on file  Stress: Not on file  Social Connections: Not on file  Intimate Partner Violence: Not on file    ALLERGIES:    No Known Allergies  CURRENT MEDICATIONS:    Current Outpatient Medications  Medication Sig Dispense Refill  . amLODipine (NORVASC) 10 MG tablet TAKE 1 TABLET BY MOUTH EVERY DAY 90 tablet 0  . aspirin EC 81 MG EC tablet Take 1 tablet (81 mg total) by mouth daily. 30 tablet 1  . atorvastatin (LIPITOR) 40 MG tablet TAKE 1 TABLET (40 MG TOTAL) BY MOUTH DAILY AT 6 PM. 90 tablet 1  . calcitRIOL (ROCALTROL) 0.25 MCG capsule Take 0.25 mcg by mouth daily.    Marland Kitchen doxazosin (CARDURA) 4 MG tablet TAKE 1 TABLET BY MOUTH TWICE A DAY 180 tablet 0  . furosemide (LASIX) 80 MG tablet Take 1 tablet by mouth 2 (two) times daily.     . hydrALAZINE (APRESOLINE) 25 MG tablet Take 1 tablet (25 mg total) by mouth 3 (three) times  daily. 120 tablet 0  . KLOR-CON M20 20 MEQ tablet Take 20 mEq by mouth Daily.     Marland Kitchen labetalol (NORMODYNE) 300 MG tablet TAKE 1 TABLET BY MOUTH TWICE A DAY 180 tablet 1  . omeprazole (PRILOSEC) 20 MG capsule Take 1 tablet by mouth Twice daily.     No current facility-administered medications for this visit.    REVIEW OF SYSTEMS:   '[X]'$  denotes positive finding, '[ ]'$  denotes negative finding Cardiac  Comments:  Chest pain or chest pressure:    Shortness of breath upon exertion:    Short of breath when lying flat:    Irregular heart rhythm:        Vascular    Pain in calf, thigh, or hip brought on by ambulation:    Pain in feet at night that wakes you up from your sleep:     Blood clot in your veins:    Leg swelling:         Pulmonary    Oxygen at home:    Productive cough:     Wheezing:         Neurologic    Sudden weakness in arms or legs:     Sudden numbness in arms or legs:      Sudden onset of difficulty speaking or slurred speech:    Temporary loss of vision in one eye:     Problems with dizziness:         Gastrointestinal    Blood in stool:      Vomited blood:         Genitourinary    Burning when urinating:     Blood in urine:        Psychiatric    Major depression:         Hematologic    Bleeding problems:    Problems with blood clotting too easily:        Skin    Rashes or ulcers:        Constitutional    Fever or chills:     PHYSICAL EXAM:   Vitals:   04/03/21 1433  BP: 139/77  Pulse: 68  Resp: 20  Temp: 98.3 F (36.8 C)  SpO2: 97%  Weight: 213 lb (96.6 kg)  Height: 5' 8.5" (1.74 m)    GENERAL: The patient is a well-nourished male, in no acute distress. The vital signs are documented above. CARDIAC: There is a regular rate and rhythm.  VASCULAR: Palpable right radial pulse PULMONARY: Nonlabored respirations MUSCULOSKELETAL: There are no major deformities or cyanosis. SKIN: There are no ulcers or rashes noted. PSYCHIATRIC: The patient has a normal affect.  STUDIES:   I have reviewed the following:  Arterial:  Left Pre-Dialysis Findings:  +-----------------------+----------+--------------------+---------+--------  +  Location        PSV (cm/s)Intralum. Diam. (cm)Waveform  Comments  +-----------------------+----------+--------------------+---------+--------  +  Brachial Antecub. fossa90    0.60        triphasic        +-----------------------+----------+--------------------+---------+--------  +  Radial Art at Wrist  81    0.33        triphasic        +-----------------------+----------+--------------------+---------+--------  +  Ulnar Art at Wrist   68    0.24        triphasic        +-----------------------+----------+--------------------+---------+--------  +  +-----------------+-------------+----------+---------+  Right  Cephalic  Diameter (cm)Depth (cm)Findings   +-----------------+-------------+----------+---------+  Shoulder  0.42     0.97         +-----------------+-------------+----------+---------+  Prox upper arm    0.39     0.48         +-----------------+-------------+----------+---------+  Mid upper arm    0.33     0.45         +-----------------+-------------+----------+---------+  Dist upper arm    0.38     0.25         +-----------------+-------------+----------+---------+  Antecubital fossa  0.54     0.22         +-----------------+-------------+----------+---------+  Prox forearm     0.17     0.42  branching  +-----------------+-------------+----------+---------+  Mid forearm     0.21     0.33         +-----------------+-------------+----------+---------+  Dist forearm     0.16     0.43         +-----------------+-------------+----------+---------+  Wrist        0.14     0.38         +-----------------+-------------+----------+---------+   +-----------------+-------------+----------+---------+  Right Basilic  Diameter (cm)Depth (cm)Findings   +-----------------+-------------+----------+---------+  Mid upper arm    0.34     0.52         +-----------------+-------------+----------+---------+  Dist upper arm    0.37     0.52         +-----------------+-------------+----------+---------+  Antecubital fossa  0.38     0.37         +-----------------+-------------+----------+---------+  Prox forearm     0.23     0.21  branching  +-----------------+-------------+----------+---------+  Mid forearm     0.18     0.27  branching  +-----------------+-------------+----------+---------+  Distal forearm    0.12     0.29          +-----------------+-------------+----------+---------+   +-----------------+-------------+----------+---------+  Left Cephalic  Diameter (cm)Depth (cm)Findings   +-----------------+-------------+----------+---------+  Shoulder       0.26     0.57         +-----------------+-------------+----------+---------+  Prox upper arm    0.25     0.33         +-----------------+-------------+----------+---------+  Mid upper arm    0.28     0.32         +-----------------+-------------+----------+---------+  Dist upper arm    0.29     0.38  branching  +-----------------+-------------+----------+---------+  Antecubital fossa  0.20     0.38         +-----------------+-------------+----------+---------+  Prox forearm     0.20     0.29         +-----------------+-------------+----------+---------+  Mid forearm     0.11     0.18         +-----------------+-------------+----------+---------+  Dist forearm     0.11     0.18         +-----------------+-------------+----------+---------+   +-----------------+-------------+----------+---------+  Left Basilic   Diameter (cm)Depth (cm)Findings   +-----------------+-------------+----------+---------+  Prox upper arm    0.27     0.58         +-----------------+-------------+----------+---------+  Mid upper arm    0.31     0.67         +-----------------+-------------+----------+---------+  Dist upper arm    0.33     0.41         +-----------------+-------------+----------+---------+  Antecubital fossa  0.26     0.41  branching  +-----------------+-------------+----------+---------+  Prox forearm  0.20     0.22         +-----------------+-------------+----------+---------+  Mid forearm     0.22     0.20          +-----------------+-------------+----------+---------+  Distal forearm    0.18     0.22         +-----------------+-------------+----------+---------+  Wrist        0.13     0.22         +-----------------+-------------+----------+---------+   ASSESSMENT and PLAN   CKD 5: Based off of vein mapping, I think the patient's best option is a right brachiocephalic fistula.  I discussed the details of the operation including the risks and benefits, not limited to bleeding and steal syndrome.  All of his questions have been answered.  We will schedule him in the immediate future.  He would like to further discuss this with Dr. Johnney Ou.   Leia Alf, MD, FACS [facility] [phone] Pager [phone]

## 2021-04-05 ENCOUNTER — Other Ambulatory Visit: Payer: Self-pay

## 2021-04-28 ENCOUNTER — Encounter (HOSPITAL_COMMUNITY): Payer: Self-pay | Admitting: Vascular Surgery

## 2021-04-30 ENCOUNTER — Encounter (HOSPITAL_COMMUNITY): Payer: Self-pay | Admitting: Vascular Surgery

## 2021-04-30 NOTE — Progress Notes (Signed)
Pre-op instructions given to pt. Npo at midnight. Pt. Reports he is on the transplant  waiting list [facility]'$ .  Denies chest pain, gets sob on exertion.

## 2021-05-01 ENCOUNTER — Encounter (HOSPITAL_COMMUNITY): Payer: Self-pay | Admitting: Vascular Surgery

## 2021-05-01 ENCOUNTER — Other Ambulatory Visit (HOSPITAL_COMMUNITY)
Admission: RE | Admit: 2021-05-01 | Discharge: 2021-05-01 | Disposition: A | Discharge status: Home / Self Care | Payer: BC Managed Care – PPO | Source: Ambulatory Visit | Attending: Vascular Surgery | Admitting: Vascular Surgery

## 2021-05-01 DIAGNOSIS — Z823 Family history of stroke: Secondary | ICD-10-CM | POA: Diagnosis not present

## 2021-05-01 DIAGNOSIS — Z20822 Contact with and (suspected) exposure to covid-19: Secondary | ICD-10-CM | POA: Insufficient documentation

## 2021-05-01 DIAGNOSIS — F17210 Nicotine dependence, cigarettes, uncomplicated: Secondary | ICD-10-CM | POA: Diagnosis not present

## 2021-05-01 DIAGNOSIS — K219 Gastro-esophageal reflux disease without esophagitis: Secondary | ICD-10-CM | POA: Diagnosis not present

## 2021-05-01 DIAGNOSIS — Z79899 Other long term (current) drug therapy: Secondary | ICD-10-CM | POA: Diagnosis not present

## 2021-05-01 DIAGNOSIS — Z7982 Long term (current) use of aspirin: Secondary | ICD-10-CM | POA: Diagnosis not present

## 2021-05-01 DIAGNOSIS — I69398 Other sequelae of cerebral infarction: Secondary | ICD-10-CM | POA: Diagnosis not present

## 2021-05-01 DIAGNOSIS — I120 Hypertensive chronic kidney disease with stage 5 chronic kidney disease or end stage renal disease: Secondary | ICD-10-CM | POA: Diagnosis not present

## 2021-05-01 DIAGNOSIS — Z01812 Encounter for preprocedural laboratory examination: Secondary | ICD-10-CM | POA: Insufficient documentation

## 2021-05-01 DIAGNOSIS — N185 Chronic kidney disease, stage 5: Secondary | ICD-10-CM | POA: Diagnosis present

## 2021-05-01 NOTE — Anesthesia Preprocedure Evaluation (Addendum)
Anesthesia Evaluation  Patient identified by MRN, date of birth, ID band Patient awake    Reviewed: Allergy & Precautions, NPO status , Patient's Chart, lab work & pertinent test results  Airway Mallampati: II  TM Distance: >3 FB Neck ROM: Full    Dental  (+) Partial Upper, Partial Lower   Pulmonary sleep apnea , Current Smoker and Patient abstained from smoking.,    Pulmonary exam normal breath sounds clear to auscultation       Cardiovascular hypertension, Normal cardiovascular exam Rhythm:Regular Rate:Normal     Neuro/Psych CVA (left sided weakness), Residual Symptoms negative psych ROS   GI/Hepatic GERD  ,(+)     substance abuse  marijuana use,   Endo/Other  negative endocrine ROS  Renal/GU Renal Insufficiency and CRFRenal disease  negative genitourinary   Musculoskeletal  (+) Arthritis ,   Abdominal   Peds  Hematology  (+) Blood dyscrasia, anemia ,   Anesthesia Other Findings   Reproductive/Obstetrics negative OB ROS                           Anesthesia Physical Anesthesia Plan  ASA: III  Anesthesia Plan: General   Post-op Pain Management:    Induction: Intravenous  PONV Risk Score and Plan: 1 and Treatment may vary due to age or medical condition, Ondansetron and Dexamethasone  Airway Management Planned: LMA  Additional Equipment:   Intra-op Plan:   Post-operative Plan: Extubation in OR  Informed Consent: I have reviewed the patients History and Physical, chart, labs and discussed the procedure including the risks, benefits and alternatives for the proposed anesthesia with the patient or authorized representative who has indicated his/her understanding and acceptance.     Dental advisory given  Plan Discussed with: Anesthesiologist and CRNA  Anesthesia Plan Comments: (GA/LMA.  )     Anesthesia Quick Evaluation

## 2021-05-01 NOTE — Progress Notes (Signed)
Anesthesia Chart Review: SAME DAY WORK-UP   Case: [id number] Date/Time: 05/02/21 0859   Procedure: RIGHT ARM ARTERIOVENOUS (AV) FISTULA CREATION (Right )   Anesthesia type: Choice   Pre-op diagnosis: STAGE 5 CHRONIC KIDNEY DISEASE   Location: [facility] ROOM 09 / [facility]   Surgeons: Waynetta Sandy, MD      DISCUSSION: Patient is a 70 year old male scheduled for the above procedure.  History includes smoking, HTN, GERD, anemia, CVA (07/2019), HLD, exertional dyspnea, OSA, CKD5.  Presurgical COVID-19 test is scheduled for 05/01/21. Anesthesia team to evaluate on the day of surgery.    VS: Ht '5\' 11"'$  (1.803 m)   Wt 97.1 kg   BMI 29.85 kg/m   BP Readings from Last 3 Encounters:  04/03/21 139/77  12/07/20 130/82  07/07/20 (!) 150/82   Pulse Readings from Last 3 Encounters:  04/03/21 68  12/07/20 78  07/07/20 69    PROVIDERS: [name], FNP is PCP  Jannifer Hick, MD is nephrologist. He is also undergoing evaluation for renal transplant at [facility].    LABS: For day of surgery.    EKG: Last EKG seen is > 1 year ago (07/2019: SB, moderate voltage criteria for LVH, may be normal variant, non-specific T wave abnormality)   CV: Echo 07/21/19: IMPRESSIONS  1. The left ventricle has normal systolic function, with an ejection  fraction of 55-60%. The cavity size was normal. There is moderately  increased left ventricular wall thickness. Left ventricular diastolic  Doppler parameters are consistent with  pseudonormalization. No evidence of left ventricular regional wall motion  abnormalities.  2. The right ventricle has normal systolic function. The cavity was  normal. There is no increase in right ventricular wall thickness. Right  ventricular systolic pressure is normal.  3. Left atrial size was mildly dilated.  4. The aortic valve is tricuspid. Mild sclerosis of the aortic valve.  5. The aortic root and ascending aorta are normal in size and structure.   Carotid US  07/21/19: Summary:  - Right Carotid: Velocities in the right ICA are consistent with a 1-39%  stenosis.  - Left Carotid: Velocities in the left ICA are consistent with a 1-39%  stenosis.  - Vertebrals: Bilateral vertebral arteries demonstrate antegrade flow.   Transcranial Doppler 07/21/19: Summary:  Poor bony windows throughout limit evaluation. Low normal mean flow  velocities in majority of identified vessels of anterior and posterior  cerebral circulation.   Past Medical History:  Diagnosis Date  . Anemia   . Arthritis   . Blood transfusion 1989  . Chronic kidney disease   . Dyspnea    on exertion  . GERD (gastroesophageal reflux disease)   . HLD (hyperlipidemia)   . Hypertension   . Sleep apnea   . Stroke ([place]) 08/2020   no residule  . Ulcer 1989   BLEEDING STOMACH ULCER    Past Surgical History:  Procedure Laterality Date  . [place]  . TONSILLECTOMY  AS CHILD    MEDICATIONS: No current facility-administered medications for this encounter.   Marland Kitchen amLODipine (NORVASC) 10 MG tablet  . aspirin EC 81 MG EC tablet  . atorvastatin (LIPITOR) 40 MG tablet  . calcitRIOL (ROCALTROL) 0.5 MCG capsule  . doxazosin (CARDURA) 4 MG tablet  . ferrous gluconate (FERGON) 240 (27 FE) MG tablet  . furosemide (LASIX) 80 MG tablet  . hydrALAZINE (APRESOLINE) 25 MG tablet  . KLOR-CON M20 20 MEQ tablet  . labetalol (NORMODYNE) 300  MG tablet  . Multiple Vitamins-Minerals (MULTIVITAMIN WITH MINERALS) tablet  . omeprazole (PRILOSEC) 20 MG capsule    Myra Gianotti, PA-C Surgical Short Stay/Anesthesiology [facility] Phone [phone] [facility] Phone [phone] 05/01/2021 12:05 PM

## 2021-05-02 ENCOUNTER — Ambulatory Visit (HOSPITAL_COMMUNITY)
Admission: RE | Admit: 2021-05-02 | Discharge: 2021-05-02 | Disposition: A | Discharge status: Home / Self Care | Payer: BC Managed Care – PPO | Attending: Vascular Surgery | Admitting: Vascular Surgery

## 2021-05-02 ENCOUNTER — Encounter (HOSPITAL_COMMUNITY): Payer: Self-pay | Admitting: Vascular Surgery

## 2021-05-02 ENCOUNTER — Ambulatory Visit (HOSPITAL_COMMUNITY): Payer: BC Managed Care – PPO | Admitting: Vascular Surgery

## 2021-05-02 ENCOUNTER — Other Ambulatory Visit: Payer: Self-pay

## 2021-05-02 ENCOUNTER — Encounter (HOSPITAL_COMMUNITY)
Admission: RE | Disposition: A | Discharge status: Home / Self Care | Payer: Self-pay | Source: Home / Self Care | Attending: Vascular Surgery

## 2021-05-02 DIAGNOSIS — F17210 Nicotine dependence, cigarettes, uncomplicated: Secondary | ICD-10-CM | POA: Insufficient documentation

## 2021-05-02 DIAGNOSIS — Z20822 Contact with and (suspected) exposure to covid-19: Secondary | ICD-10-CM | POA: Insufficient documentation

## 2021-05-02 DIAGNOSIS — N185 Chronic kidney disease, stage 5: Secondary | ICD-10-CM | POA: Insufficient documentation

## 2021-05-02 DIAGNOSIS — Z823 Family history of stroke: Secondary | ICD-10-CM | POA: Insufficient documentation

## 2021-05-02 DIAGNOSIS — I120 Hypertensive chronic kidney disease with stage 5 chronic kidney disease or end stage renal disease: Secondary | ICD-10-CM | POA: Diagnosis not present

## 2021-05-02 DIAGNOSIS — I69398 Other sequelae of cerebral infarction: Secondary | ICD-10-CM | POA: Insufficient documentation

## 2021-05-02 DIAGNOSIS — Z7982 Long term (current) use of aspirin: Secondary | ICD-10-CM | POA: Insufficient documentation

## 2021-05-02 DIAGNOSIS — Z79899 Other long term (current) drug therapy: Secondary | ICD-10-CM | POA: Insufficient documentation

## 2021-05-02 DIAGNOSIS — N184 Chronic kidney disease, stage 4 (severe): Secondary | ICD-10-CM

## 2021-05-02 DIAGNOSIS — K219 Gastro-esophageal reflux disease without esophagitis: Secondary | ICD-10-CM | POA: Insufficient documentation

## 2021-05-02 HISTORY — DX: Unspecified osteoarthritis, unspecified site: M19.90

## 2021-05-02 HISTORY — PX: AV FISTULA PLACEMENT: SHX1204

## 2021-05-02 HISTORY — DX: Anemia, unspecified: D64.9

## 2021-05-02 HISTORY — DX: Sleep apnea, unspecified: G47.30

## 2021-05-02 HISTORY — DX: Hyperlipidemia, unspecified: E78.5

## 2021-05-02 HISTORY — DX: Dyspnea, unspecified: R06.00

## 2021-05-02 LAB — POCT I-STAT, CHEM 8
BUN: 81 mg/dL — ABNORMAL HIGH (ref 8–23)
Calcium, Ion: 1.09 mmol/L — ABNORMAL LOW (ref 1.15–1.40)
Chloride: 111 mmol/L (ref 98–111)
Creatinine, Ser: 9.4 mg/dL — ABNORMAL HIGH (ref 0.61–1.24)
Glucose, Bld: 91 mg/dL (ref 70–99)
HCT: 29 % — ABNORMAL LOW (ref 39.0–52.0)
Hemoglobin: 9.9 g/dL — ABNORMAL LOW (ref 13.0–17.0)
Potassium: 3.8 mmol/L (ref 3.5–5.1)
Sodium: 143 mmol/L (ref 135–145)
TCO2: 21 mmol/L — ABNORMAL LOW (ref 22–32)

## 2021-05-02 LAB — SARS CORONAVIRUS 2 (TAT 6-24 HRS): SARS Coronavirus 2: NEGATIVE

## 2021-05-02 SURGERY — ARTERIOVENOUS (AV) FISTULA CREATION
Anesthesia: General | Laterality: Right

## 2021-05-02 MED ORDER — ORAL CARE MOUTH RINSE: 15.0000 mL | Freq: Once | OROMUCOSAL | Status: AC

## 2021-05-02 MED ORDER — ROCURONIUM BROMIDE 10 MG/ML (PF) SYRINGE
PREFILLED_SYRINGE | INTRAVENOUS | Status: AC
  Filled 2021-05-02: qty 10

## 2021-05-02 MED ORDER — SODIUM CHLORIDE 0.9 % IV SOLN: INTRAVENOUS | Status: DC

## 2021-05-02 MED ORDER — PHENYLEPHRINE 40 MCG/ML (10ML) SYRINGE FOR IV PUSH (FOR BLOOD PRESSURE SUPPORT)
PREFILLED_SYRINGE | INTRAVENOUS | Status: AC
  Filled 2021-05-02: qty 10

## 2021-05-02 MED ORDER — CHLORHEXIDINE GLUCONATE 4 % EX LIQD: 60.0000 mL | Freq: Once | CUTANEOUS | Status: DC

## 2021-05-02 MED ORDER — LIDOCAINE 2% (20 MG/ML) 5 ML SYRINGE
INTRAMUSCULAR | Status: AC
  Filled 2021-05-02: qty 5

## 2021-05-02 MED ORDER — LIDOCAINE 2% (20 MG/ML) 5 ML SYRINGE
INTRAMUSCULAR | Status: DC | PRN
  Administered 2021-05-02: 80 mg via INTRAVENOUS

## 2021-05-02 MED ORDER — DEXAMETHASONE SODIUM PHOSPHATE 10 MG/ML IJ SOLN
INTRAMUSCULAR | Status: DC | PRN
  Administered 2021-05-02: 5 mg via INTRAVENOUS

## 2021-05-02 MED ORDER — EPHEDRINE SULFATE-NACL 50-0.9 MG/10ML-% IV SOSY
PREFILLED_SYRINGE | INTRAVENOUS | Status: DC | PRN
  Administered 2021-05-02 (×3): 5 mg via INTRAVENOUS
  Administered 2021-05-02 (×2): 10 mg via INTRAVENOUS

## 2021-05-02 MED ORDER — CHLORHEXIDINE GLUCONATE 0.12 % MT SOLN: 15.0000 mL | Freq: Once | OROMUCOSAL | Status: AC

## 2021-05-02 MED ORDER — 0.9 % SODIUM CHLORIDE (POUR BTL) OPTIME
TOPICAL | Status: DC | PRN
  Administered 2021-05-02: 1000 mL

## 2021-05-02 MED ORDER — CHLORHEXIDINE GLUCONATE 0.12 % MT SOLN
OROMUCOSAL | Status: AC
  Administered 2021-05-02: 15 mL via OROMUCOSAL
  Filled 2021-05-02: qty 15

## 2021-05-02 MED ORDER — CEFAZOLIN SODIUM-DEXTROSE 2-4 GM/100ML-% IV SOLN
2.0000 g | INTRAVENOUS | Status: AC
  Administered 2021-05-02: 2 g via INTRAVENOUS

## 2021-05-02 MED ORDER — PROPOFOL 10 MG/ML IV BOLUS
INTRAVENOUS | Status: DC | PRN
  Administered 2021-05-02: 150 mg via INTRAVENOUS

## 2021-05-02 MED ORDER — OXYCODONE HCL 5 MG PO TABS: 5.0000 mg | ORAL_TABLET | Freq: Once | ORAL | Status: DC | PRN

## 2021-05-02 MED ORDER — ONDANSETRON HCL 4 MG/2ML IJ SOLN
INTRAMUSCULAR | Status: DC | PRN
  Administered 2021-05-02: 4 mg via INTRAVENOUS

## 2021-05-02 MED ORDER — ONDANSETRON HCL 4 MG/2ML IJ SOLN
INTRAMUSCULAR | Status: AC
  Filled 2021-05-02: qty 2

## 2021-05-02 MED ORDER — SODIUM CHLORIDE 0.9 % IV SOLN
INTRAVENOUS | Status: DC | PRN
  Administered 2021-05-02: 500 mL

## 2021-05-02 MED ORDER — CEFAZOLIN SODIUM-DEXTROSE 2-4 GM/100ML-% IV SOLN
INTRAVENOUS | Status: AC
  Filled 2021-05-02: qty 100

## 2021-05-02 MED ORDER — FENTANYL CITRATE (PF) 100 MCG/2ML IJ SOLN: 25.0000 ug | INTRAMUSCULAR | Status: DC | PRN

## 2021-05-02 MED ORDER — DEXAMETHASONE SODIUM PHOSPHATE 10 MG/ML IJ SOLN
INTRAMUSCULAR | Status: AC
  Filled 2021-05-02: qty 1

## 2021-05-02 MED ORDER — OXYCODONE-ACETAMINOPHEN 5-325 MG PO TABS: 1.0000 | ORAL_TABLET | Freq: Four times a day (QID) | ORAL | 0 refills | Status: DC | PRN

## 2021-05-02 MED ORDER — FENTANYL CITRATE (PF) 250 MCG/5ML IJ SOLN
INTRAMUSCULAR | Status: DC | PRN
  Administered 2021-05-02: 25 ug via INTRAVENOUS
  Administered 2021-05-02: 50 ug via INTRAVENOUS

## 2021-05-02 MED ORDER — PHENYLEPHRINE HCL-NACL 10-0.9 MG/250ML-% IV SOLN
0.0000 ug/min | INTRAVENOUS | Status: DC
  Administered 2021-05-02: 50 ug/min via INTRAVENOUS

## 2021-05-02 MED ORDER — LIDOCAINE-EPINEPHRINE 1 %-1:100000 IJ SOLN
INTRAMUSCULAR | Status: AC
  Filled 2021-05-02: qty 1

## 2021-05-02 MED ORDER — PHENYLEPHRINE 40 MCG/ML (10ML) SYRINGE FOR IV PUSH (FOR BLOOD PRESSURE SUPPORT)
PREFILLED_SYRINGE | INTRAVENOUS | Status: DC | PRN
  Administered 2021-05-02 (×4): 40 ug via INTRAVENOUS
  Administered 2021-05-02: 80 ug via INTRAVENOUS

## 2021-05-02 MED ORDER — EPHEDRINE 5 MG/ML INJ
INTRAVENOUS | Status: AC
  Filled 2021-05-02: qty 10

## 2021-05-02 MED ORDER — ALBUMIN HUMAN 5 % IV SOLN: INTRAVENOUS | Status: DC | PRN

## 2021-05-02 MED ORDER — SODIUM CHLORIDE 0.9 % IV SOLN
INTRAVENOUS | Status: AC
  Filled 2021-05-02: qty 1.2

## 2021-05-02 MED ORDER — PROPOFOL 10 MG/ML IV BOLUS
INTRAVENOUS | Status: AC
  Filled 2021-05-02: qty 20

## 2021-05-02 MED ORDER — OXYCODONE HCL 5 MG/5ML PO SOLN: 5.0000 mg | Freq: Once | ORAL | Status: DC | PRN

## 2021-05-02 MED ORDER — PHENYLEPHRINE HCL-NACL 10-0.9 MG/250ML-% IV SOLN
INTRAVENOUS | Status: DC | PRN
  Administered 2021-05-02: 40 ug/min via INTRAVENOUS

## 2021-05-02 MED ORDER — PROMETHAZINE HCL 25 MG/ML IJ SOLN: 6.2500 mg | INTRAMUSCULAR | Status: DC | PRN

## 2021-05-02 MED ORDER — FENTANYL CITRATE (PF) 250 MCG/5ML IJ SOLN
INTRAMUSCULAR | Status: AC
  Filled 2021-05-02: qty 5

## 2021-05-02 SURGICAL SUPPLY — 30 items
ARMBAND PINK RESTRICT EXTREMIT (MISCELLANEOUS) ×2 IMPLANT
CANISTER SUCT 3000ML PPV (MISCELLANEOUS) ×2 IMPLANT
CLIP LIGATING EXTRA MED SLVR (CLIP) ×2 IMPLANT
CLIP LIGATING EXTRA SM BLUE (MISCELLANEOUS) ×2 IMPLANT
CLIP VESOCCLUDE MED 6/CT (CLIP) ×2 IMPLANT
CLIP VESOCCLUDE SM WIDE 6/CT (CLIP) ×2 IMPLANT
COVER PROBE W GEL 5X96 (DRAPES) IMPLANT
COVER WAND RF STERILE (DRAPES) IMPLANT
DERMABOND ADVANCED (GAUZE/BANDAGES/DRESSINGS) ×1
DERMABOND ADVANCED .7 DNX12 (GAUZE/BANDAGES/DRESSINGS) ×1 IMPLANT
ELECT REM PT RETURN 9FT ADLT (ELECTROSURGICAL) ×2
ELECTRODE REM PT RTRN 9FT ADLT (ELECTROSURGICAL) ×1 IMPLANT
GLOVE BIO SURGEON STRL SZ7.5 (GLOVE) ×2 IMPLANT
GOWN STRL REUS W/ TWL LRG LVL3 (GOWN DISPOSABLE) ×1 IMPLANT
GOWN STRL REUS W/ TWL XL LVL3 (GOWN DISPOSABLE) ×1 IMPLANT
GOWN STRL REUS W/TWL LRG LVL3 (GOWN DISPOSABLE) ×2
GOWN STRL REUS W/TWL XL LVL3 (GOWN DISPOSABLE) ×2
INSERT FOGARTY SM (MISCELLANEOUS) IMPLANT
KIT BASIN OR (CUSTOM PROCEDURE TRAY) ×2 IMPLANT
KIT TURNOVER KIT B (KITS) ×2 IMPLANT
NS IRRIG 1000ML POUR BTL (IV SOLUTION) ×2 IMPLANT
PACK CV ACCESS (CUSTOM PROCEDURE TRAY) ×2 IMPLANT
PAD ARMBOARD 7.5X6 YLW CONV (MISCELLANEOUS) ×4 IMPLANT
SUT MNCRL AB 4-0 PS2 18 (SUTURE) ×2 IMPLANT
SUT PROLENE 6 0 BV (SUTURE) ×2 IMPLANT
SUT VIC AB 3-0 SH 27 (SUTURE) ×2
SUT VIC AB 3-0 SH 27X BRD (SUTURE) ×1 IMPLANT
TOWEL GREEN STERILE (TOWEL DISPOSABLE) ×2 IMPLANT
UNDERPAD 30X36 HEAVY ABSORB (UNDERPADS AND DIAPERS) ×2 IMPLANT
WATER STERILE IRR 1000ML POUR (IV SOLUTION) ×2 IMPLANT

## 2021-05-02 NOTE — Anesthesia Postprocedure Evaluation (Signed)
Anesthesia Post Note  Patient: Ryan Salazar  Procedure(s) Performed: RIGHT ARM ARTERIOVENOUS (AV) FISTULA CREATION (Right )     Patient location during evaluation: PACU Anesthesia Type: General Level of consciousness: awake Pain management: pain level controlled Vital Signs Assessment: post-procedure vital signs reviewed and stable Respiratory status: spontaneous breathing and respiratory function stable Cardiovascular status: stable Postop Assessment: no apparent nausea or vomiting Anesthetic complications: no   No complications documented.  Last Vitals:  Vitals:   05/02/21 1045 05/02/21 1100  BP: 126/88 126/79  Pulse: 65 67  Resp: 13 19  Temp:  36.6 C  SpO2: 98% 100%    Last Pain:  Vitals:   05/02/21 1100  TempSrc:   PainSc: 0-No pain                 Merlinda Frederick

## 2021-05-02 NOTE — Anesthesia Procedure Notes (Signed)
Procedure Name: LMA Insertion Date/Time: 05/02/2021 9:33 AM Performed by: Janene Harvey, CRNA Pre-anesthesia Checklist: Patient identified, Emergency Drugs available, Suction available and Patient being monitored Patient Re-evaluated:Patient Re-evaluated prior to induction Oxygen Delivery Method: Circle system utilized Preoxygenation: Pre-oxygenation with 100% oxygen Induction Type: IV induction LMA: LMA inserted LMA Size: 4.0 Placement Confirmation: positive ETCO2 Dental Injury: Teeth and Oropharynx as per pre-operative assessment

## 2021-05-02 NOTE — Op Note (Signed)
    Patient name: IZEIAH SILVERSTONE MRN: [id number] DOB: 1951-05-04 Sex: male  05/02/2021 Pre-operative Diagnosis: ckd Post-operative diagnosis:  Same Surgeon:  Erlene Quan C. Donzetta Matters, MD Procedure Performed:  Right brachial artery to cephalic vein av fistula creation  Indications: 70 year old male with chronic kidney disease.  He is indicated for permanent access.  He has suitable cephalic vein on the right for fistula creation.  Findings: Cephalic vein measured approximately 5 mm without disease.  The brachial artery was approximately 8 mm and free of disease.  At completion there was a very strong thrill in the fistula and a palpable radial artery pulse the wrist both confirmed with Doppler.   Procedure:  The patient was identified in the holding area and taken to the operating was placed supine on upper table and LMA anesthesia induced.  He was sterilely prepped draped in the right upper extremity usual fashion, antibiotics were minister timeout was called.  We made a transverse incision at the right antecubitum.  The vein was dissected free marked for orientation.  Branches divided between Ties.  We Dissected through the Deep Fascia Identified the Brachial Artery Placed Vesseloops around This.  The vein was then clamped distally and transected.  There was dilated flushed with heparinized saline and clamped.  The artery was clamped distally proximally opened longitudinally flushed heparinized saline only distally given the large size.  The vein was then sewn into side with 6-0 Prolene suture.  Prior completion of flushing all directions.  Upon completion was a very strong thrill in the cephalic vein and a palpable radial artery pulse the wrist.  These were both confirmed with Doppler.  Satisfied with this we irrigated the wound obtain hemostasis and closed in layers with Vicryl and Monocryl.  Dermabond is placed to the level of the skin.  He was awakened from anesthesia having tolerated procedure well without  immediate complication.  Counts were correct at completion.  EBL: 20cc   Arlee Santosuosso C. Donzetta Matters, MD [facility] Office: [phone] Pager: [phone]

## 2021-05-02 NOTE — Discharge Instructions (Signed)
   [facility]  Discharge Instructions  AV Fistula or Graft Surgery for Dialysis Access  Please refer to the following instructions for your post-procedure care. Your surgeon or physician assistant will discuss any changes with you.  Activity  You may drive the day following your surgery, if you are comfortable and no longer taking prescription pain medication. Resume full activity as the soreness in your incision resolves.  Bathing/Showering  You may shower after you go home. Keep your incision dry for 48 hours. Do not soak in a bathtub, hot tub, or swim until the incision heals completely. You may not shower if you have a hemodialysis catheter.  Incision Care  Clean your incision with mild soap and water after 48 hours. Pat the area dry with a clean towel. You do not need a bandage unless otherwise instructed. Do not apply any ointments or creams to your incision. You may have skin glue on your incision. Do not peel it off. It will come off on its own in about one week. Your arm may swell a bit after surgery. To reduce swelling use pillows to elevate your arm so it is above your heart. Your doctor will tell you if you need to lightly wrap your arm with an ACE bandage.  Diet  Resume your normal diet. There are not special food restrictions following this procedure. In order to heal from your surgery, it is CRITICAL to get adequate nutrition. Your body requires vitamins, minerals, and protein. Vegetables are the best source of vitamins and minerals. Vegetables also provide the perfect balance of protein. Processed food has little nutritional value, so try to avoid this.  Medications  Resume taking all of your medications. If your incision is causing pain, you may take over-the counter pain relievers such as acetaminophen (Tylenol). If you were prescribed a stronger pain medication, please be aware these medications can cause nausea and constipation. Prevent  nausea by taking the medication with a snack or meal. Avoid constipation by drinking plenty of fluids and eating foods with high amount of fiber, such as fruits, vegetables, and grains.  Do not take Tylenol if you are taking prescription pain medications.  Follow up Your surgeon may want to see you in the office following your access surgery. If so, this will be arranged at the time of your surgery.  Please call us immediately for any of the following conditions:  . Increased pain, redness, drainage (pus) from your incision site . Fever of 101 degrees or higher . Severe or worsening pain at your incision site . Hand pain or numbness. .  Reduce your risk of vascular disease:  . Stop smoking. If you would like help, call [facility] at 1-800-QUIT-NOW 4010741563) or [place] at (320) 838-9769  . Manage your cholesterol . Maintain a desired weight . Control your diabetes . Keep your blood pressure down  Dialysis  It will take several weeks to several months for your new dialysis access to be ready for use. Your surgeon will determine when it is okay to use it. Your nephrologist will continue to direct your dialysis. You can continue to use your Permcath until your new access is ready for use.   05/02/2021 Ryan Salazar [id number] 1951/10/30  Surgeon(s): Waynetta Sandy, MD  Procedure(s): RIGHT ARM ARTERIOVENOUS (AV) FISTULA CREATION  x Do not stick fistula for 12 weeks    If you have any questions, please call the office at [phone].

## 2021-05-02 NOTE — Interval H&P Note (Signed)
History and Physical Interval Note:  05/02/2021 7:32 AM  Ryan Salazar  has presented today for surgery, with the diagnosis of STAGE 5 CHRONIC KIDNEY DISEASE.  The various methods of treatment have been discussed with the patient and family. After consideration of risks, benefits and other options for treatment, the patient has consented to  Procedure(s): RIGHT ARM ARTERIOVENOUS (AV) FISTULA CREATION (Right) as a surgical intervention.  The patient's history has been reviewed, patient examined, no change in status, stable for surgery.  I have reviewed the patient's chart and labs.  Questions were answered to the patient's satisfaction.     Servando Snare

## 2021-05-02 NOTE — Transfer of Care (Signed)
Immediate Anesthesia Transfer of Care Note  Patient: Ryan Salazar  Procedure(s) Performed: RIGHT ARM ARTERIOVENOUS (AV) FISTULA CREATION (Right )  Patient Location: PACU  Anesthesia Type:General  Level of Consciousness: drowsy and patient cooperative  Airway & Oxygen Therapy: Patient Spontanous Breathing and Patient connected to face mask oxygen  Post-op Assessment: Report given to RN and Post -op Vital signs reviewed and stable  Post vital signs: Reviewed  Last Vitals:  Vitals Value Taken Time  BP 108/65 05/02/21 1026  Temp    Pulse 66 05/02/21 1028  Resp 16 05/02/21 1028  SpO2 100 % 05/02/21 1028  Vitals shown include unvalidated device data.  Last Pain:  Vitals:   05/02/21 0739  TempSrc:   PainSc: 0-No pain      Patients Stated Pain Goal: 3 ([id number])  Complications: No complications documented.

## 2021-05-03 ENCOUNTER — Encounter (HOSPITAL_COMMUNITY): Payer: Self-pay | Admitting: Vascular Surgery

## 2021-05-10 ENCOUNTER — Other Ambulatory Visit: Payer: Self-pay

## 2021-05-10 DIAGNOSIS — N184 Chronic kidney disease, stage 4 (severe): Secondary | ICD-10-CM

## 2021-05-13 ENCOUNTER — Other Ambulatory Visit: Payer: Self-pay | Admitting: Nurse Practitioner

## 2021-05-14 ENCOUNTER — Other Ambulatory Visit: Payer: Self-pay | Admitting: Nurse Practitioner

## 2021-06-07 ENCOUNTER — Ambulatory Visit: Payer: BC Managed Care – PPO | Admitting: Nurse Practitioner

## 2021-06-07 ENCOUNTER — Other Ambulatory Visit: Payer: Self-pay

## 2021-06-07 ENCOUNTER — Encounter: Payer: Self-pay | Admitting: Nurse Practitioner

## 2021-06-07 VITALS — BP 132/80 | HR 82 | Temp 98.4°F | Ht 70.2 in | Wt 216.4 lb

## 2021-06-07 DIAGNOSIS — N184 Chronic kidney disease, stage 4 (severe): Secondary | ICD-10-CM | POA: Diagnosis not present

## 2021-06-07 DIAGNOSIS — E6609 Other obesity due to excess calories: Secondary | ICD-10-CM

## 2021-06-07 DIAGNOSIS — E782 Mixed hyperlipidemia: Secondary | ICD-10-CM | POA: Diagnosis not present

## 2021-06-07 DIAGNOSIS — Z6830 Body mass index (BMI) 30.0-30.9, adult: Secondary | ICD-10-CM

## 2021-06-07 DIAGNOSIS — I129 Hypertensive chronic kidney disease with stage 1 through stage 4 chronic kidney disease, or unspecified chronic kidney disease: Secondary | ICD-10-CM | POA: Diagnosis not present

## 2021-06-07 DIAGNOSIS — I10 Essential (primary) hypertension: Secondary | ICD-10-CM

## 2021-06-07 NOTE — Progress Notes (Signed)
I,Tianna Badgett,acting as a Education administrator for [employer], NP.,have documented all relevant documentation on the behalf of [employer], NP,as directed by  Bary Castilla, NP while in the presence of Bary Castilla, NP.  This visit occurred during the SARS-CoV-2 public health emergency.  Safety protocols were in place, including screening questions prior to the visit, additional usage of staff PPE, and extensive cleaning of exam room while observing appropriate contact time as indicated for disinfecting solutions.  Subjective:     Patient ID: Ryan Salazar , male    DOB: 07/24/1951 , 70 y.o.   MRN: [id number]   Chief Complaint  Patient presents with  . Hypertension  . Hyperlipidemia    HPI  Patient is here for htn follow up. Today is BP is 150/82. He sees the nephrologist in July. His last GFR was 14. Will check again. His BP before he left the office today was 132/80. He is on the waiting list to receive a new kidney.   Hypertension This is a chronic problem. The current episode started more than 1 year ago. The problem is uncontrolled. Pertinent negatives include no anxiety, chest pain, headaches, palpitations or shortness of breath. Risk factors for coronary artery disease include sedentary lifestyle. Past treatments include diuretics, beta blockers and direct vasodilators. The current treatment provides moderate improvement. There are no compliance problems.  Hypertensive end-organ damage includes kidney disease. Identifiable causes of hypertension include chronic renal disease.     Past Medical History:  Diagnosis Date  . Anemia   . Arthritis   . Blood transfusion 1989  . Chronic kidney disease   . Dyspnea    on exertion - no oxygen  . GERD (gastroesophageal reflux disease)   . HLD (hyperlipidemia)   . Hypertension   . Sleep apnea   . Stroke ([place]) 07/2019   no residual  . Ulcer 1989   BLEEDING STOMACH ULCER     Family History  Problem Relation Age of Onset  .  Stroke Mother   . Healthy Father   . Colon cancer Neg Hx   . Colon polyps Neg Hx      Current Outpatient Medications:  .  amLODipine (NORVASC) 10 MG tablet, TAKE 1 TABLET BY MOUTH EVERY DAY, Disp: 90 tablet, Rfl: 0 .  aspirin EC 81 MG EC tablet, Take 1 tablet (81 mg total) by mouth daily., Disp: 30 tablet, Rfl: 1 .  atorvastatin (LIPITOR) 40 MG tablet, TAKE 1 TABLET (40 MG TOTAL) BY MOUTH DAILY AT 6 PM., Disp: 90 tablet, Rfl: 1 .  calcitRIOL (ROCALTROL) 0.5 MCG capsule, Take 0.25 mcg by mouth daily., Disp: , Rfl:  .  doxazosin (CARDURA) 4 MG tablet, TAKE 1 TABLET BY MOUTH TWICE A DAY, Disp: 180 tablet, Rfl: 0 .  ferrous gluconate (FERGON) 240 (27 FE) MG tablet, Take 240 mg by mouth daily., Disp: , Rfl:  .  furosemide (LASIX) 80 MG tablet, Take 80 mg by mouth 2 (two) times daily., Disp: , Rfl:  .  hydrALAZINE (APRESOLINE) 25 MG tablet, Take 1 tablet (25 mg total) by mouth 3 (three) times daily. (Patient taking differently: Take 25 mg by mouth 2 (two) times daily.), Disp: 120 tablet, Rfl: 0 .  KLOR-CON M20 20 MEQ tablet, Take 20 mEq by mouth Daily. , Disp: , Rfl:  .  labetalol (NORMODYNE) 300 MG tablet, TAKE 1 TABLET BY MOUTH TWICE A DAY (Patient taking differently: Take 300 mg by mouth 2 (two) times daily.), Disp: 180 tablet, Rfl: 1 .  Multiple Vitamins-Minerals (MULTIVITAMIN WITH MINERALS) tablet, Take 1 tablet by mouth daily., Disp: , Rfl:  .  omeprazole (PRILOSEC) 20 MG capsule, Take 20 mg by mouth daily., Disp: , Rfl:    No Known Allergies   Review of Systems  Constitutional: Negative.  Negative for chills, fatigue and fever.  Respiratory: Negative.  Negative for chest tightness, shortness of breath and wheezing.   Cardiovascular: Negative.  Negative for chest pain and palpitations.  Gastrointestinal: Negative.   Neurological: Negative.  Negative for headaches.     Today's Vitals   06/07/21 1547  BP: 132/80  Pulse: 82  Temp: 98.4 F (36.9 C)  TempSrc: Oral  Weight: 216 lb 6.4  oz (98.2 kg)  Height: 5' 10.2" (1.783 m)   Body mass index is 30.87 kg/m.   Objective:  Physical Exam Constitutional:      Appearance: Normal appearance.  Cardiovascular:     Rate and Rhythm: Normal rate and regular rhythm.     Pulses: Normal pulses.     Heart sounds: Normal heart sounds. No murmur heard.   Pulmonary:     Effort: Pulmonary effort is normal. No respiratory distress.     Breath sounds: Normal breath sounds. No wheezing.  Musculoskeletal:     Cervical back: Normal range of motion and neck supple.  Neurological:     Mental Status: He is alert.         Assessment And Plan:     1. Essential hypertension -Chronic, stable, continue meds.  -Limit the intake of processed foods and salt intake. You should increase your intake of green vegetables and fruits. Limit the use of alcohol. Limit fast foods and fried foods. Avoid high fatty saturated and trans fat foods. Keep yourself hydrated with drinking water. Avoid red meats. Eat lean meats instead. Exercise for atleast 30-45 min for atleast 4-5 times a week.  -Instructed patient to keep a log of his BP and send a msg on mychart or call us with the reading for thenext week.  - CMP14+EGFR - CBC no Diff  2. Mixed hyperlipidemia --Educated patient about a diet that is low in fat and high fatty foods including dairy products. Increase in take of fish and fiber. Decrease intake of red meats and fast foods. Exercise for atleast 4-5 times a week or atleast 30-45 min. Drink a lot of water.   - Lipid panel  3. CKD (chronic kidney disease) stage 4, GFR 15-29 ml/min (HCC) -Patient follows up with nephrologist  -He does have fistula and starts dialysis in couple of months  -He is also on the list for receiving a kidney.  - Hemoglobin A1c  4. Class 1 obesity due to excess calories without serious comorbidity with body mass index (BMI) of 30.0 to 30.9 in adult  Advised patient on a healthy diet including avoiding fast food and red  meats. Increase the intake of lean meats including grilled chicken and [place].  Drink a lot of water. Decrease intake of fatty foods. Exercise for 30-45 min. 4-5 a week to decrease the risk of cardiac event.   The patient was encouraged to call or send a message through [place] for any questions or concerns.   Side effects and appropriate use of all the medication(s) were discussed with the patient today. Patient advised to use the medication(s) as directed by their healthcare provider. The patient was encouraged to read, review, and understand all associated package inserts and contact our office with any questions or concerns. The patient  accepts the risks of the treatment plan and had an opportunity to ask questions.   Follow up: if symptoms persist or do not get better.   Patient was given opportunity to ask questions. Patient verbalized understanding of the plan and was able to repeat key elements of the plan. All questions were answered to their satisfaction.  Raman Lashawnda Hancox, DNP   I, Raman Zade Falkner have reviewed all documentation for this visit. The documentation on 06/07/21 for the exam, diagnosis, procedures, and orders are all accurate and complete.    IF YOU HAVE BEEN REFERRED TO A SPECIALIST, IT MAY TAKE 1-2 WEEKS TO SCHEDULE/PROCESS THE REFERRAL. IF YOU HAVE NOT HEARD FROM US/SPECIALIST IN TWO WEEKS, PLEASE GIVE [place] A CALL AT [phone].   THE PATIENT IS ENCOURAGED TO PRACTICE SOCIAL DISTANCING DUE TO THE COVID-19 PANDEMIC.

## 2021-06-08 LAB — LIPID PANEL
Chol/HDL Ratio: 2.3 ratio (ref 0.0–5.0)
Cholesterol, Total: 75 mg/dL — ABNORMAL LOW (ref 100–199)
HDL: 32 mg/dL — ABNORMAL LOW (ref >39)
LDL Chol Calc (NIH): 28 mg/dL (ref 0–99)
Triglycerides: 65 mg/dL (ref 0–149)
VLDL Cholesterol Cal: 15 mg/dL (ref 5–40)

## 2021-06-08 LAB — CMP14+EGFR
ALT: 13 IU/L (ref 0–44)
AST: 19 IU/L (ref 0–40)
Albumin/Globulin Ratio: 2 (ref 1.2–2.2)
Albumin: 4.2 g/dL (ref 3.8–4.8)
Alkaline Phosphatase: 49 IU/L (ref 44–121)
BUN/Creatinine Ratio: 9 — ABNORMAL LOW (ref 10–24)
BUN: 70 mg/dL — ABNORMAL HIGH (ref 8–27)
Bilirubin Total: 0.3 mg/dL (ref 0.0–1.2)
CO2: 20 mmol/L (ref 20–29)
Calcium: 8.5 mg/dL — ABNORMAL LOW (ref 8.6–10.2)
Chloride: 106 mmol/L (ref 96–106)
Creatinine, Ser: 7.92 mg/dL — ABNORMAL HIGH (ref 0.76–1.27)
Globulin, Total: 2.1 g/dL (ref 1.5–4.5)
Glucose: 83 mg/dL (ref 65–99)
Potassium: 4.5 mmol/L (ref 3.5–5.2)
Sodium: 143 mmol/L (ref 134–144)
Total Protein: 6.3 g/dL (ref 6.0–8.5)
eGFR: 7 mL/min/{1.73_m2} — ABNORMAL LOW (ref >59)

## 2021-06-08 LAB — CBC
Hematocrit: 28.2 % — ABNORMAL LOW (ref 37.5–51.0)
Hemoglobin: 8.8 g/dL — ABNORMAL LOW (ref 13.0–17.7)
MCH: 25.7 pg — ABNORMAL LOW (ref 26.6–33.0)
MCHC: 31.2 g/dL — ABNORMAL LOW (ref 31.5–35.7)
MCV: 82 fL (ref 79–97)
Platelets: 197 10*3/uL (ref 150–450)
RBC: 3.43 x10E6/uL — ABNORMAL LOW (ref 4.14–5.80)
RDW: 14 % (ref 11.6–15.4)
WBC: 5.6 10*3/uL (ref 3.4–10.8)

## 2021-06-08 LAB — HEMOGLOBIN A1C
Est. average glucose Bld gHb Est-mCnc: 126 mg/dL
Hgb A1c MFr Bld: 6 % — ABNORMAL HIGH (ref 4.8–5.6)

## 2021-06-09 ENCOUNTER — Ambulatory Visit (INDEPENDENT_AMBULATORY_CARE_PROVIDER_SITE_OTHER): Payer: BC Managed Care – PPO | Admitting: Physician Assistant

## 2021-06-09 ENCOUNTER — Encounter: Payer: Self-pay | Admitting: Physician Assistant

## 2021-06-09 ENCOUNTER — Ambulatory Visit (HOSPITAL_COMMUNITY)
Admission: RE | Admit: 2021-06-09 | Discharge: 2021-06-09 | Disposition: A | Discharge status: Home / Self Care | Payer: BC Managed Care – PPO | Source: Ambulatory Visit | Attending: Vascular Surgery | Admitting: Vascular Surgery

## 2021-06-09 ENCOUNTER — Other Ambulatory Visit: Payer: Self-pay

## 2021-06-09 VITALS — BP 137/78 | HR 71 | Temp 98.1°F | Resp 20 | Ht 70.0 in | Wt 214.3 lb

## 2021-06-09 DIAGNOSIS — N184 Chronic kidney disease, stage 4 (severe): Secondary | ICD-10-CM | POA: Diagnosis present

## 2021-06-09 NOTE — Progress Notes (Signed)
Postoperative Access Visit   History of Present Illness   Ryan Salazar is a 70 y.o. year old male who presents for postoperative follow-up for: right brachiocephalic AV fistula by Dr. Donzetta Matters on 05/02/21. The patient's wounds are healed.  The patient notes no steal symptoms. The patient is able to complete their activities of daily living. He does have some soreness if he uses the arm a lot but otherwise no pain. He additionally has had some swelling in the arm and hand but it is not causing any discomfort. He is not yet on hemodialysis. His Nephrologist is Dr. Johnney Ou. He says he is on [facility] list with [facility] but explains that he has to " get in shape" for him to be a candidate. He has next follow up with Dr. Johnney Ou in July  Physical Examination   Vitals:   06/09/21 1039  BP: 137/78  Pulse: 71  Resp: 20  Temp: 98.1 F (36.7 C)  TempSrc: Temporal  SpO2: 98%  Weight: 214 lb 4.8 oz (97.2 kg)  Height: '5\' 10"'$  (1.778 m)   Body mass index is 30.75 kg/m.  right arm Incision is well healed, 2+ radial pulse, hand grip is 5/5, sensation in digits is intact, palpable thrill, bruit can be auscultated    Non invasive vascular lab: Findings:  +--------------------+----------+-----------------+--------+  AVF                 PSV (cm/s)Flow Vol (mL/min)Comments  +--------------------+----------+-----------------+--------+  Native artery inflow   298          2928                 +--------------------+----------+-----------------+--------+  AVF Anastomosis        642                               +--------------------+----------+-----------------+--------+      +------------+----------+-------------+----------+--------+  OUTFLOW VEINPSV (cm/s)Diameter (cm)Depth (cm)Describe  +------------+----------+-------------+----------+--------+  Shoulder       218                                     +------------+----------+-------------+----------+--------+  Prox UA         259        0.68        0.62             +------------+----------+-------------+----------+--------+  Mid UA         457        0.61        0.27             +------------+----------+-------------+----------+--------+  Dist UA        340        0.64        0.32             +------------+----------+-------------+----------+--------+  AC Fossa       212        0.79        0.14             +------------+----------+-------------+----------+--------+   Medical Decision Making   Ryan Salazar is a 70 y.o. year old male who presents s/p right brachiocephalic AV fistula by Dr. Donzetta Matters on 05/02/21. Duplex today shows patent AV fistula that has adequately matured. He is not having any steal symptoms. He does have  mild swelling of RUE. Recommend he elevate his arm as needed. He is not currently on HD. He will follow up with Nephrologist regarding initiation of dialysis.  The patient's access will be ready for use after 08/02/21 The patient may follow up on a prn basis   Karoline Caldwell, PA-C [facility] [place]: [phone]  On call MD: Dr. Stanford Breed

## 2021-06-15 ENCOUNTER — Telehealth: Payer: Self-pay

## 2021-06-15 NOTE — Telephone Encounter (Signed)
Left the patient a message to call back for lab results. 

## 2021-06-15 NOTE — Telephone Encounter (Signed)
-----   Message from Bary Castilla, NP sent at 06/12/2021  2:42 PM EDT ----- Your kidney function has worsened from last time. Your Hgb A1c is the same as last time at 6.0.  Be sure to decrease your intake of sugary foods and drinks. Increase your physical activity. Cholesterol levels remain lower than usual. Are you taking your medication every other day? Increase your intake of good fats such as nuts and salmon. Your Hmg is also down at 8.8. We will forward your results to your nephrologist Dr. Johnney Ou. Let us know if you have any questions.

## 2021-06-15 NOTE — Progress Notes (Signed)
I think you seen him last

## 2021-06-20 ENCOUNTER — Other Ambulatory Visit: Payer: Self-pay | Admitting: Nurse Practitioner

## 2021-06-23 ENCOUNTER — Other Ambulatory Visit (HOSPITAL_COMMUNITY): Payer: Self-pay | Admitting: *Deleted

## 2021-06-26 ENCOUNTER — Encounter (HOSPITAL_COMMUNITY)
Admission: RE | Admit: 2021-06-26 | Discharge: 2021-06-26 | Disposition: A | Discharge status: Home / Self Care | Payer: BC Managed Care – PPO | Source: Ambulatory Visit | Attending: Internal Medicine | Admitting: Internal Medicine

## 2021-06-26 DIAGNOSIS — N184 Chronic kidney disease, stage 4 (severe): Secondary | ICD-10-CM | POA: Diagnosis present

## 2021-06-26 DIAGNOSIS — D631 Anemia in chronic kidney disease: Secondary | ICD-10-CM | POA: Insufficient documentation

## 2021-06-26 LAB — POCT HEMOGLOBIN-HEMACUE: Hemoglobin: 8.9 g/dL — ABNORMAL LOW (ref 13.0–17.0)

## 2021-06-26 MED ORDER — SODIUM CHLORIDE 0.9 % IV SOLN
510.0000 mg | INTRAVENOUS | Status: DC
  Administered 2021-06-26: 510 mg via INTRAVENOUS
  Filled 2021-06-26: qty 510

## 2021-06-26 MED ORDER — EPOETIN ALFA-EPBX 10000 UNIT/ML IJ SOLN
INTRAMUSCULAR | Status: AC
  Administered 2021-06-26: 20000 [IU]
  Filled 2021-06-26: qty 2

## 2021-07-24 ENCOUNTER — Other Ambulatory Visit: Payer: Self-pay

## 2021-07-24 ENCOUNTER — Ambulatory Visit (HOSPITAL_COMMUNITY)
Admission: RE | Admit: 2021-07-24 | Discharge: 2021-07-24 | Disposition: A | Discharge status: Home / Self Care | Payer: BC Managed Care – PPO | Source: Ambulatory Visit | Attending: Internal Medicine | Admitting: Internal Medicine

## 2021-07-24 VITALS — BP 127/72 | HR 61 | Temp 96.9°F | Resp 18

## 2021-07-24 DIAGNOSIS — N184 Chronic kidney disease, stage 4 (severe): Secondary | ICD-10-CM | POA: Diagnosis present

## 2021-07-24 LAB — POCT HEMOGLOBIN-HEMACUE: Hemoglobin: 9.9 g/dL — ABNORMAL LOW (ref 13.0–17.0)

## 2021-07-24 MED ORDER — EPOETIN ALFA-EPBX 10000 UNIT/ML IJ SOLN
INTRAMUSCULAR | Status: AC
  Filled 2021-07-24: qty 2

## 2021-07-24 MED ORDER — EPOETIN ALFA-EPBX 10000 UNIT/ML IJ SOLN
20000.0000 [IU] | INTRAMUSCULAR | Status: DC
  Administered 2021-07-24: 20000 [IU] via SUBCUTANEOUS

## 2021-07-24 MED ORDER — SODIUM CHLORIDE 0.9 % IV SOLN
510.0000 mg | INTRAVENOUS | Status: DC
  Administered 2021-07-24: 510 mg via INTRAVENOUS
  Filled 2021-07-24: qty 17

## 2021-08-04 ENCOUNTER — Other Ambulatory Visit: Payer: Self-pay

## 2021-08-04 DIAGNOSIS — N184 Chronic kidney disease, stage 4 (severe): Secondary | ICD-10-CM

## 2021-08-09 ENCOUNTER — Other Ambulatory Visit: Payer: Self-pay

## 2021-08-09 ENCOUNTER — Encounter: Payer: Self-pay | Admitting: Physician Assistant

## 2021-08-09 ENCOUNTER — Ambulatory Visit (HOSPITAL_COMMUNITY)
Admission: RE | Admit: 2021-08-09 | Discharge: 2021-08-09 | Disposition: A | Discharge status: Home / Self Care | Payer: BC Managed Care – PPO | Source: Ambulatory Visit | Attending: Surgery | Admitting: Surgery

## 2021-08-09 ENCOUNTER — Ambulatory Visit (INDEPENDENT_AMBULATORY_CARE_PROVIDER_SITE_OTHER): Payer: BC Managed Care – PPO | Admitting: Physician Assistant

## 2021-08-09 VITALS — BP 116/61 | HR 63 | Temp 98.2°F | Resp 20 | Ht 70.0 in | Wt 202.0 lb

## 2021-08-09 DIAGNOSIS — N185 Chronic kidney disease, stage 5: Secondary | ICD-10-CM

## 2021-08-09 DIAGNOSIS — N184 Chronic kidney disease, stage 4 (severe): Secondary | ICD-10-CM | POA: Diagnosis not present

## 2021-08-09 NOTE — Progress Notes (Signed)
Established Dialysis Access   History of Present Illness   Ryan Salazar is a 70 y.o. (02/08/1951) male who presents for re-evaluation of maturity of his right brachiocephalic AC fistula as well as pain. This was initially created on 05/02/21 by Dr. Donzetta Matters.  He was last seen on 06/09/21 and his fistula had healed nicely and duplex indicated adequate maturation and volume flow. He explains that since he was last seen he has been getting intermittent but daily sharp shooting pains in forearm and upper arm. He also says he will have an achy "just uncomfortable" feeling in right arm. This discomfort limits his use of the arm and he says he struggles with fine motor movements because of the discomfort such as picking up or using utensils, writing, or picking up other small objects. The pain does not wake him up from sleep. He otherwise is not having any numbness, coldness, or weakness in his arm or hand. His incisions are well healed. He is not yet on Hemodialysis. His Nephrologist is Dr. Johnney Ou.  The patient's PMH, PSH, SH, and FamHx were reviewed today are unchanged from his last visit on June 10,2022.  Current Outpatient Medications  Medication Sig Dispense Refill   amLODipine (NORVASC) 10 MG tablet TAKE 1 TABLET BY MOUTH EVERY DAY 90 tablet 0   aspirin EC 81 MG EC tablet Take 1 tablet (81 mg total) by mouth daily. 30 tablet 1   atorvastatin (LIPITOR) 40 MG tablet TAKE 1 TABLET BY MOUTH DAILY AT 6 PM. 90 tablet 1   calcitRIOL (ROCALTROL) 0.5 MCG capsule Take 0.25 mcg by mouth daily.     doxazosin (CARDURA) 4 MG tablet TAKE 1 TABLET BY MOUTH TWICE A DAY 180 tablet 0   ferrous gluconate (FERGON) 240 (27 FE) MG tablet Take 240 mg by mouth daily.     furosemide (LASIX) 80 MG tablet Take 80 mg by mouth 2 (two) times daily.     KLOR-CON M20 20 MEQ tablet Take 20 mEq by mouth Daily.      labetalol (NORMODYNE) 300 MG tablet TAKE 1 TABLET BY MOUTH TWICE A DAY (Patient taking differently: Take 300 mg by mouth  2 (two) times daily.) 180 tablet 1   Multiple Vitamins-Minerals (MULTIVITAMIN WITH MINERALS) tablet Take 1 tablet by mouth daily.     omeprazole (PRILOSEC) 20 MG capsule Take 20 mg by mouth daily.     hydrALAZINE (APRESOLINE) 25 MG tablet Take 1 tablet (25 mg total) by mouth 3 (three) times daily. (Patient taking differently: Take 25 mg by mouth 2 (two) times daily.) 120 tablet 0   No current facility-administered medications for this visit.    On ROS today: negative unless stated in HPI   Physical Examination   Vitals:   08/09/21 1417  BP: 116/61  Pulse: 63  Resp: 20  Temp: 98.2 F (36.8 C)  SpO2: 98%  Weight: 202 lb (91.6 kg)  Height: '5\' 10"'$  (1.778 m)   Body mass index is 28.98 kg/m.  right arm Incision is well healed, 2+ radial pulse, hand grip is 5/5, sensation in digits is intact, palpable thrill, bruit can  be auscultated . Right hand warm and well perfused     Non-invasive Vascular Imaging   right Arm Access Duplex  (08/09/21):  Diameters:  0.59-.80 mm Depth:  0.15- 0.66 mm PSV:  563 c/s     Findings:  +--------------------+----------+-----------------+--------+  AVF  PSV (cm/s)Flow Vol (mL/min)Comments  +--------------------+----------+-----------------+--------+  Native artery inflow   248          2008                 +--------------------+----------+-----------------+--------+  AVF Anastomosis        556                               +--------------------+----------+-----------------+--------+      +------------+----------+-------------+----------+-------------------------  ----+  OUTFLOW VEINPSV (cm/s)Diameter (cm)Depth (cm)          Describe              +------------+----------+-------------+----------+-------------------------  ----+  Shoulder       306        0.59        0.66                                   +------------+----------+-------------+----------+-------------------------  ----+  Prox UA         563        0.62        0.28   Retained valve, stenotic  and                                                      competing branch          +------------+----------+-------------+----------+-------------------------  ----+  Mid UA         256        0.60        0.36                                   +------------+----------+-------------+----------+-------------------------  ----+  Dist UA        156        0.80        0.15                                   +------------+----------+-------------+----------+-------------------------  ----+  AC Fossa       352        0.70        0.26                                   +------------+----------+-------------+----------+-------------------------  ----+      Summary:  Patent arteriovenous fistula.  Arteriovenous fistula-Stenosis noted.   Medical Decision Making   [name] is a 70 y.o. male who presents for follow up evaluation of right brachiocephalic AV fistula. His incisions are well healed. Fistula is well matured and patent. There is area of elevated velocity in proximal upper arm from competing branch but fistula has excellent thrill and bruit. I would not recommend intervening on this unless there are issues with dialysis once he starts using fistula Recommend elevation of RUE as needed for swelling I suspect his pain is neuropathic in nature. He has adequate blood flow to the right had with easily palpable radial and ulnar pulses. Hand  is warm. Grip strength is normal. I do not suspect IMN Discussed option of having fistula ligated if he feels that his pain is unbearable vs sticking it out. He wishes to continue along as is. He says he can presently tolerate his symptoms and wants to avoid surgery if possible He will follow up as needed if he has new or worsening symptoms    Karoline Caldwell, PA-C [facility]: [phone]  On call MD: Trula Slade

## 2021-08-10 ENCOUNTER — Other Ambulatory Visit: Payer: Self-pay | Admitting: Nurse Practitioner

## 2021-08-13 ENCOUNTER — Other Ambulatory Visit: Payer: Self-pay | Admitting: Nurse Practitioner

## 2021-08-14 ENCOUNTER — Other Ambulatory Visit: Payer: Self-pay | Admitting: Nurse Practitioner

## 2021-08-20 ENCOUNTER — Other Ambulatory Visit: Payer: Self-pay | Admitting: Nurse Practitioner

## 2021-08-21 ENCOUNTER — Other Ambulatory Visit: Payer: Self-pay

## 2021-08-21 ENCOUNTER — Ambulatory Visit (HOSPITAL_COMMUNITY)
Admission: RE | Admit: 2021-08-21 | Discharge: 2021-08-21 | Disposition: A | Discharge status: Home / Self Care | Payer: BC Managed Care – PPO | Source: Ambulatory Visit | Attending: Internal Medicine | Admitting: Internal Medicine

## 2021-08-21 VITALS — BP 136/74 | HR 70 | Resp 18

## 2021-08-21 DIAGNOSIS — N184 Chronic kidney disease, stage 4 (severe): Secondary | ICD-10-CM | POA: Diagnosis not present

## 2021-08-21 LAB — RENAL FUNCTION PANEL
Albumin: 4.1 g/dL (ref 3.5–5.0)
Anion gap: 14 (ref 5–15)
BUN: 108 mg/dL — ABNORMAL HIGH (ref 8–23)
CO2: 20 mmol/L — ABNORMAL LOW (ref 22–32)
Calcium: 8.5 mg/dL — ABNORMAL LOW (ref 8.9–10.3)
Chloride: 108 mmol/L (ref 98–111)
Creatinine, Ser: 10.18 mg/dL — ABNORMAL HIGH (ref 0.61–1.24)
GFR, Estimated: 5 mL/min — ABNORMAL LOW (ref >60)
Glucose, Bld: 110 mg/dL — ABNORMAL HIGH (ref 70–99)
Phosphorus: 5.3 mg/dL — ABNORMAL HIGH (ref 2.5–4.6)
Potassium: 4.6 mmol/L (ref 3.5–5.1)
Sodium: 142 mmol/L (ref 135–145)

## 2021-08-21 LAB — IRON AND TIBC
Iron: 63 ug/dL (ref 45–182)
Saturation Ratios: 27 % (ref 17.9–39.5)
TIBC: 237 ug/dL — ABNORMAL LOW (ref 250–450)
UIBC: 174 ug/dL

## 2021-08-21 LAB — FERRITIN: Ferritin: 277 ng/mL (ref 24–336)

## 2021-08-21 LAB — POCT HEMOGLOBIN-HEMACUE: Hemoglobin: 10.7 g/dL — ABNORMAL LOW (ref 13.0–17.0)

## 2021-08-21 MED ORDER — EPOETIN ALFA-EPBX 10000 UNIT/ML IJ SOLN: 20000.0000 [IU] | INTRAMUSCULAR | Status: DC

## 2021-08-21 MED ORDER — EPOETIN ALFA-EPBX 10000 UNIT/ML IJ SOLN
INTRAMUSCULAR | Status: AC
  Administered 2021-08-21: 20000 [IU] via SUBCUTANEOUS
  Filled 2021-08-21: qty 2

## 2021-08-22 LAB — PTH, INTACT AND CALCIUM
Calcium, Total (PTH): 8.4 mg/dL — ABNORMAL LOW (ref 8.6–10.2)
PTH: 191 pg/mL — ABNORMAL HIGH (ref 15–65)

## 2021-09-08 ENCOUNTER — Encounter: Payer: Self-pay | Admitting: Gastroenterology

## 2021-09-11 ENCOUNTER — Encounter (HOSPITAL_COMMUNITY)
Admission: RE | Admit: 2021-09-11 | Discharge: 2021-09-11 | Disposition: A | Discharge status: Home / Self Care | Payer: BC Managed Care – PPO | Source: Ambulatory Visit | Attending: Internal Medicine | Admitting: Internal Medicine

## 2021-09-11 ENCOUNTER — Other Ambulatory Visit: Payer: Self-pay

## 2021-09-11 VITALS — BP 135/84 | HR 74 | Temp 98.6°F | Resp 18

## 2021-09-11 DIAGNOSIS — N184 Chronic kidney disease, stage 4 (severe): Secondary | ICD-10-CM | POA: Diagnosis present

## 2021-09-11 LAB — IRON AND TIBC
Iron: 70 ug/dL (ref 45–182)
Saturation Ratios: 31 % (ref 17.9–39.5)
TIBC: 223 ug/dL — ABNORMAL LOW (ref 250–450)
UIBC: 153 ug/dL

## 2021-09-11 LAB — POCT HEMOGLOBIN-HEMACUE: Hemoglobin: 10.4 g/dL — ABNORMAL LOW (ref 13.0–17.0)

## 2021-09-11 LAB — FERRITIN: Ferritin: 213 ng/mL (ref 24–336)

## 2021-09-11 MED ORDER — EPOETIN ALFA-EPBX 10000 UNIT/ML IJ SOLN: 15000.0000 [IU] | INTRAMUSCULAR | Status: DC

## 2021-09-11 MED ORDER — EPOETIN ALFA-EPBX 10000 UNIT/ML IJ SOLN
INTRAMUSCULAR | Status: AC
  Administered 2021-09-11: 15000 [IU] via SUBCUTANEOUS
  Filled 2021-09-11: qty 2

## 2021-09-12 ENCOUNTER — Ambulatory Visit: Payer: Medicare Other | Admitting: Nurse Practitioner

## 2021-09-15 ENCOUNTER — Telehealth: Payer: Self-pay

## 2021-09-15 NOTE — Telephone Encounter (Signed)
Patient has been taking the doxazosin 2 times a day for several years, have him to call his insurance to see what alternatives they have. Also see how much it is for the medication two times a day at the pharmacy    I called and left pt vm to call the office [facility]

## 2021-09-18 ENCOUNTER — Encounter (HOSPITAL_COMMUNITY): Payer: BC Managed Care – PPO

## 2021-10-06 ENCOUNTER — Other Ambulatory Visit (HOSPITAL_COMMUNITY): Payer: Self-pay | Admitting: *Deleted

## 2021-10-09 ENCOUNTER — Encounter (HOSPITAL_COMMUNITY): Payer: BC Managed Care – PPO

## 2021-10-09 ENCOUNTER — Encounter (HOSPITAL_COMMUNITY)
Admission: RE | Admit: 2021-10-09 | Discharge: 2021-10-09 | Disposition: A | Discharge status: Home / Self Care | Payer: BC Managed Care – PPO | Source: Ambulatory Visit | Attending: Internal Medicine | Admitting: Internal Medicine

## 2021-10-09 VITALS — BP 116/75 | HR 64 | Temp 98.2°F | Resp 18

## 2021-10-09 DIAGNOSIS — N184 Chronic kidney disease, stage 4 (severe): Secondary | ICD-10-CM | POA: Insufficient documentation

## 2021-10-09 LAB — IRON AND TIBC
Iron: 61 ug/dL (ref 45–182)
Saturation Ratios: 28 % (ref 17.9–39.5)
TIBC: 221 ug/dL — ABNORMAL LOW (ref 250–450)
UIBC: 160 ug/dL

## 2021-10-09 LAB — RENAL FUNCTION PANEL
Albumin: 3.7 g/dL (ref 3.5–5.0)
Anion gap: 14 (ref 5–15)
BUN: 109 mg/dL — ABNORMAL HIGH (ref 8–23)
CO2: 20 mmol/L — ABNORMAL LOW (ref 22–32)
Calcium: 7.9 mg/dL — ABNORMAL LOW (ref 8.9–10.3)
Chloride: 104 mmol/L (ref 98–111)
Creatinine, Ser: 10.6 mg/dL — ABNORMAL HIGH (ref 0.61–1.24)
GFR, Estimated: 5 mL/min — ABNORMAL LOW (ref >60)
Glucose, Bld: 141 mg/dL — ABNORMAL HIGH (ref 70–99)
Phosphorus: 5.7 mg/dL — ABNORMAL HIGH (ref 2.5–4.6)
Potassium: 3.6 mmol/L (ref 3.5–5.1)
Sodium: 138 mmol/L (ref 135–145)

## 2021-10-09 LAB — FERRITIN: Ferritin: 196 ng/mL (ref 24–336)

## 2021-10-09 LAB — HEPATITIS B SURFACE ANTIGEN: Hepatitis B Surface Ag: NONREACTIVE

## 2021-10-09 MED ORDER — EPOETIN ALFA-EPBX 10000 UNIT/ML IJ SOLN
INTRAMUSCULAR | Status: AC
  Filled 2021-10-09: qty 2

## 2021-10-09 MED ORDER — EPOETIN ALFA-EPBX 10000 UNIT/ML IJ SOLN
15000.0000 [IU] | INTRAMUSCULAR | Status: DC
  Administered 2021-10-09: 15000 [IU] via SUBCUTANEOUS

## 2021-10-10 LAB — HEPATITIS B SURFACE ANTIBODY, QUANTITATIVE: Hep B S AB Quant (Post): <3.1 m[IU]/mL — ABNORMAL LOW (ref >9.9)

## 2021-10-10 LAB — PTH, INTACT AND CALCIUM
Calcium, Total (PTH): 8 mg/dL — ABNORMAL LOW (ref 8.6–10.2)
PTH: 196 pg/mL — ABNORMAL HIGH (ref 15–65)

## 2021-10-10 LAB — POCT HEMOGLOBIN-HEMACUE: Hemoglobin: 10.6 g/dL — ABNORMAL LOW (ref 13.0–17.0)

## 2021-10-16 ENCOUNTER — Other Ambulatory Visit: Payer: Self-pay

## 2021-10-16 ENCOUNTER — Ambulatory Visit (HOSPITAL_COMMUNITY)
Admission: EM | Admit: 2021-10-16 | Discharge: 2021-10-16 | Disposition: A | Discharge status: Home / Self Care | Payer: BC Managed Care – PPO

## 2021-10-16 ENCOUNTER — Encounter (HOSPITAL_COMMUNITY): Payer: Self-pay | Admitting: Emergency Medicine

## 2021-10-16 DIAGNOSIS — I159 Secondary hypertension, unspecified: Secondary | ICD-10-CM

## 2021-10-16 DIAGNOSIS — S0121XA Laceration without foreign body of nose, initial encounter: Secondary | ICD-10-CM | POA: Diagnosis not present

## 2021-10-16 NOTE — ED Triage Notes (Signed)
Pt presents with nose bleed. States cut himself shaving today.

## 2021-10-16 NOTE — Discharge Instructions (Addendum)
Please discuss your elevated blood pressure with your doctor soon as possible.  I believe that your elevated blood pressure along with your standard dose of aspirin are the reasons that you have had difficulty getting the bleeding from the cut on your nose to stop.  Keep the pressure bandage on the area for at least the next 6 hours.  You are welcome to apply ointment to the area as needed once the bleeding is stopped completely.

## 2021-10-16 NOTE — ED Provider Notes (Signed)
[facility]    CSN: [id number] Arrival date & time: 10/16/21  1711      History   Chief Complaint Chief Complaint  Patient presents with   Epistaxis    HPI Ryan Salazar is a 70 y.o. male.   Patient states he cut his nose earlier today while shaving, states he is done this in the past, states is never bled this long or this much before.  Patient states he currently takes aspirin 81 mg daily.  As he also took all his blood pressure medicine this morning, is surprised to see that his blood pressure is elevated.  Patient reports history of stroke.  The history is provided by the patient.   Past Medical History:  Diagnosis Date   Anemia    Arthritis    Blood transfusion 1989   Chronic kidney disease    Dyspnea    on exertion - no oxygen   GERD (gastroesophageal reflux disease)    HLD (hyperlipidemia)    Hypertension    Sleep apnea    Stroke ([place]) 07/2019   no residual   Ulcer 1989   BLEEDING STOMACH ULCER    Patient Active Problem List   Diagnosis Date Noted   Abnormal glucose 12/07/2019   Tobacco abuse 08/04/2019   Left arm weakness 08/04/2019   Ischemic stroke ([place]) 08/04/2019   Acute ischemic stroke ([place]) 07/21/2019   CKD (chronic kidney disease) stage 4, GFR 15-29 ml/min (HCC) 07/21/2019   Acute otitis media 06/11/2019   Excessive cerumen in right ear canal 06/11/2019   Right hand pain 06/03/2019   Essential hypertension 12/28/2018   Acute pain of left knee 12/28/2018   Stage 2 chronic kidney disease 12/28/2018   Nocturia 12/28/2018    Past Surgical History:  Procedure Laterality Date   AV FISTULA PLACEMENT Right 05/02/2021   Procedure: RIGHT ARM ARTERIOVENOUS (AV) FISTULA CREATION;  Surgeon: Waynetta Sandy, MD;  Location: [place];  Service: Vascular;  Laterality: Right;   COLONOSCOPY     [place] Medications    Prior to Admission medications    Medication Sig Start Date End Date Taking? Authorizing Provider  amLODipine (NORVASC) 10 MG tablet TAKE 1 TABLET BY MOUTH EVERY DAY 08/11/21   [name], FNP  aspirin EC 81 MG EC tablet Take 1 tablet (81 mg total) by mouth daily. 07/24/19   Shelly Coss, MD  atorvastatin (LIPITOR) 40 MG tablet TAKE 1 TABLET BY MOUTH DAILY AT 6 PM. 06/21/21   [name], FNP  calcitRIOL (ROCALTROL) 0.5 MCG capsule Take 0.25 mcg by mouth daily.    [provider]  doxazosin (CARDURA) 4 MG tablet TAKE 1 TABLET BY MOUTH TWICE A DAY 08/14/21   [name], FNP  ferrous gluconate (FERGON) 240 (27 FE) MG tablet Take 240 mg by mouth daily.    [provider]  furosemide (LASIX) 80 MG tablet Take 80 mg by mouth 2 (two) times daily. 08/12/11   [provider]  hydrALAZINE (APRESOLINE) 25 MG tablet Take 1 tablet (25 mg total) by mouth 3 (three) times daily. Patient taking differently: Take 25 mg by mouth 2 (two) times daily. 07/07/20 07/07/21  [name], FNP  KLOR-CON M20 20 MEQ tablet Take 20 mEq by mouth Daily.  07/22/11   [provider]  labetalol (NORMODYNE) 300 MG tablet TAKE 1 TABLET BY  MOUTH TWICE A DAY 08/21/21   [name], FNP  Multiple Vitamins-Minerals (MULTIVITAMIN WITH MINERALS) tablet Take 1 tablet by mouth daily.    [provider]  omeprazole (PRILOSEC) 20 MG capsule Take 20 mg by mouth daily. 07/23/11   [provider]    Family History Family History  Problem Relation Age of Onset   Stroke Mother    Healthy Father    Colon cancer Neg Hx    Colon polyps Neg Hx     Social History Social History   Tobacco Use   Smoking status: Some Days    Packs/day: 0.25    Years: 50.00    Pack years: 12.50    Types: Cigarettes   Smokeless tobacco: Never   Tobacco comments:    3 cigaretts per day  Vaping Use   Vaping Use: Never used  Substance Use Topics   Alcohol use: Yes    Comment: occasional   Drug use: Yes    Types: Marijuana     Comment:  last use was on 04/29/21     Allergies   Patient has no known allergies.   Review of Systems Review of Systems Pertinent findings noted in history of present illness.    Physical Exam Triage Vital Signs ED Triage Vitals  Enc Vitals Group     BP 10/16/21 1900 (!) 159/84     Pulse Rate 10/16/21 1900 73     Resp 10/16/21 1900 18     Temp 10/16/21 1900 98.3 F (36.8 C)     Temp Source 10/16/21 1900 Oral     SpO2 10/16/21 1900 97 %     Weight --      Height --      Head Circumference --      Peak Flow --      Pain Score 10/16/21 1901 0     Pain Loc --      Pain Edu? --      Excl. in [place]? --    No data found.  Updated Vital Signs BP (!) 159/84   Pulse 73   Temp 98.3 F (36.8 C) (Oral)   Resp 18   SpO2 97%   Visual Acuity Right Eye Distance:   Left Eye Distance:   Bilateral Distance:    Right Eye Near:   Left Eye Near:    Bilateral Near:     Physical Exam Vitals and nursing note reviewed.  Constitutional:      Appearance: Normal appearance.  HENT:     Head: Normocephalic and atraumatic.     Right Ear: Tympanic membrane, ear canal and external ear normal.     Left Ear: Tympanic membrane, ear canal and external ear normal.     Nose: Nose normal.     Comments: The lateral aspect of his left nare has been deroofed and is bleeding freely.    Mouth/Throat:     Mouth: Mucous membranes are moist.     Pharynx: Oropharynx is clear.  Eyes:     Extraocular Movements: Extraocular movements intact.     Conjunctiva/sclera: Conjunctivae normal.     Pupils: Pupils are equal, round, and reactive to light.  Cardiovascular:     Rate and Rhythm: Normal rate and regular rhythm.     Heart sounds: Normal heart sounds.  Pulmonary:     Effort: Pulmonary effort is normal.     Breath sounds: Normal breath sounds.  Abdominal:     Palpations: Abdomen is soft.  Musculoskeletal:  Cervical back: Normal range of motion and neck supple.  Skin:    General: Skin is warm  and dry.  Neurological:     General: No focal deficit present.     Mental Status: He is alert and oriented to person, place, and time.  Psychiatric:        Mood and Affect: Mood normal.        Behavior: Behavior normal.     UC Treatments / Results  Labs (all labs ordered are listed, but only abnormal results are displayed) Labs Reviewed - No data to display  EKG   Radiology No results found.  Procedures Procedures (including critical care time)  Medications Ordered in UC Medications - No data to display  Initial Impression / Assessment and Plan / UC Course  I have reviewed the triage vital signs and the nursing notes.  Pertinent labs & imaging results that were available during my care of the patient were reviewed by me and considered in my medical decision making (see chart for details).     Traumatic injury to left nare with razor blade.  Patient was provided with a pressure dressing over left nare and advised to keep it in place for the next 6 hours.  Patient was advised to discuss his elevated blood pressure with his provider.  Patient verbalized understanding and agreement of plan as discussed.  All questions were addressed during visit.  Please see discharge instructions below for further details of plan.  Final Clinical Impressions(s) / UC Diagnoses   Final diagnoses:  Laceration of nose, initial encounter  Secondary hypertension     Discharge Instructions      Please discuss your elevated blood pressure with your doctor soon as possible.  I believe that your elevated blood pressure along with your standard dose of aspirin are the reasons that you have had difficulty getting the bleeding from the cut on your nose to stop.  Keep the pressure bandage on the area for at least the next 6 hours.  You are welcome to apply ointment to the area as needed once the bleeding is stopped completely.     ED Prescriptions   None    PDMP not reviewed this encounter.    Lynden Oxford Scales, [place] 10/16/21 [phone]

## 2021-11-06 ENCOUNTER — Ambulatory Visit (HOSPITAL_COMMUNITY)
Admission: RE | Admit: 2021-11-06 | Discharge: 2021-11-06 | Disposition: A | Discharge status: Home / Self Care | Payer: BC Managed Care – PPO | Source: Ambulatory Visit | Attending: Internal Medicine | Admitting: Internal Medicine

## 2021-11-06 ENCOUNTER — Other Ambulatory Visit: Payer: Self-pay

## 2021-11-06 VITALS — BP 133/81 | HR 69 | Resp 18

## 2021-11-06 DIAGNOSIS — N184 Chronic kidney disease, stage 4 (severe): Secondary | ICD-10-CM | POA: Insufficient documentation

## 2021-11-06 LAB — RENAL FUNCTION PANEL
Albumin: 3.4 g/dL — ABNORMAL LOW (ref 3.5–5.0)
Anion gap: 17 — ABNORMAL HIGH (ref 5–15)
BUN: 115 mg/dL — ABNORMAL HIGH (ref 8–23)
CO2: 17 mmol/L — ABNORMAL LOW (ref 22–32)
Calcium: 8.4 mg/dL — ABNORMAL LOW (ref 8.9–10.3)
Chloride: 105 mmol/L (ref 98–111)
Creatinine, Ser: 11.97 mg/dL — ABNORMAL HIGH (ref 0.61–1.24)
GFR, Estimated: 4 mL/min — ABNORMAL LOW (ref >60)
Glucose, Bld: 98 mg/dL (ref 70–99)
Phosphorus: 5.9 mg/dL — ABNORMAL HIGH (ref 2.5–4.6)
Potassium: 4 mmol/L (ref 3.5–5.1)
Sodium: 139 mmol/L (ref 135–145)

## 2021-11-06 LAB — FERRITIN: Ferritin: 431 ng/mL — ABNORMAL HIGH (ref 24–336)

## 2021-11-06 LAB — POCT HEMOGLOBIN-HEMACUE: Hemoglobin: 10.1 g/dL — ABNORMAL LOW (ref 13.0–17.0)

## 2021-11-06 LAB — HEPATITIS B SURFACE ANTIGEN: Hepatitis B Surface Ag: NONREACTIVE

## 2021-11-06 LAB — IRON AND TIBC
Iron: 61 ug/dL (ref 45–182)
Saturation Ratios: 30 % (ref 17.9–39.5)
TIBC: 200 ug/dL — ABNORMAL LOW (ref 250–450)
UIBC: 139 ug/dL

## 2021-11-06 MED ORDER — EPOETIN ALFA-EPBX 10000 UNIT/ML IJ SOLN
15000.0000 [IU] | INTRAMUSCULAR | Status: DC
  Administered 2021-11-06: 15000 [IU] via SUBCUTANEOUS

## 2021-11-06 MED ORDER — EPOETIN ALFA-EPBX 10000 UNIT/ML IJ SOLN
INTRAMUSCULAR | Status: AC
  Filled 2021-11-06: qty 2

## 2021-11-07 LAB — HEPATITIS B SURFACE ANTIBODY, QUANTITATIVE: Hep B S AB Quant (Post): <3.1 m[IU]/mL — ABNORMAL LOW (ref >9.9)

## 2021-11-07 LAB — PTH, INTACT AND CALCIUM
Calcium, Total (PTH): 8.1 mg/dL — ABNORMAL LOW (ref 8.6–10.2)
PTH: 244 pg/mL — ABNORMAL HIGH (ref 15–65)

## 2021-11-10 ENCOUNTER — Other Ambulatory Visit: Payer: Self-pay | Admitting: Nurse Practitioner

## 2021-11-27 DIAGNOSIS — N189 Chronic kidney disease, unspecified: Secondary | ICD-10-CM | POA: Insufficient documentation

## 2021-11-27 DIAGNOSIS — D509 Iron deficiency anemia, unspecified: Secondary | ICD-10-CM | POA: Insufficient documentation

## 2021-12-01 ENCOUNTER — Encounter (HOSPITAL_COMMUNITY): Payer: Self-pay | Admitting: Emergency Medicine

## 2021-12-01 ENCOUNTER — Emergency Department (HOSPITAL_COMMUNITY): Payer: BC Managed Care – PPO

## 2021-12-01 ENCOUNTER — Other Ambulatory Visit: Payer: Self-pay

## 2021-12-01 ENCOUNTER — Inpatient Hospital Stay (HOSPITAL_COMMUNITY)
Admission: EM | Admit: 2021-12-01 | Discharge: 2021-12-04 | DRG: 193 | Disposition: A | Discharge status: Home / Self Care | Payer: BC Managed Care – PPO | Attending: Internal Medicine | Admitting: Internal Medicine

## 2021-12-01 DIAGNOSIS — D631 Anemia in chronic kidney disease: Secondary | ICD-10-CM | POA: Diagnosis present

## 2021-12-01 DIAGNOSIS — R778 Other specified abnormalities of plasma proteins: Secondary | ICD-10-CM | POA: Diagnosis present

## 2021-12-01 DIAGNOSIS — Z20822 Contact with and (suspected) exposure to covid-19: Secondary | ICD-10-CM | POA: Diagnosis present

## 2021-12-01 DIAGNOSIS — G4730 Sleep apnea, unspecified: Secondary | ICD-10-CM | POA: Diagnosis present

## 2021-12-01 DIAGNOSIS — I10 Essential (primary) hypertension: Secondary | ICD-10-CM | POA: Diagnosis present

## 2021-12-01 DIAGNOSIS — Z79899 Other long term (current) drug therapy: Secondary | ICD-10-CM

## 2021-12-01 DIAGNOSIS — M1990 Unspecified osteoarthritis, unspecified site: Secondary | ICD-10-CM | POA: Diagnosis present

## 2021-12-01 DIAGNOSIS — F17210 Nicotine dependence, cigarettes, uncomplicated: Secondary | ICD-10-CM | POA: Diagnosis present

## 2021-12-01 DIAGNOSIS — Z7982 Long term (current) use of aspirin: Secondary | ICD-10-CM

## 2021-12-01 DIAGNOSIS — E46 Unspecified protein-calorie malnutrition: Secondary | ICD-10-CM | POA: Diagnosis present

## 2021-12-01 DIAGNOSIS — J4540 Moderate persistent asthma, uncomplicated: Secondary | ICD-10-CM | POA: Diagnosis present

## 2021-12-01 DIAGNOSIS — J101 Influenza due to other identified influenza virus with other respiratory manifestations: Secondary | ICD-10-CM | POA: Diagnosis not present

## 2021-12-01 DIAGNOSIS — Z66 Do not resuscitate: Secondary | ICD-10-CM | POA: Diagnosis present

## 2021-12-01 DIAGNOSIS — M898X9 Other specified disorders of bone, unspecified site: Secondary | ICD-10-CM | POA: Diagnosis present

## 2021-12-01 DIAGNOSIS — R7989 Other specified abnormal findings of blood chemistry: Secondary | ICD-10-CM | POA: Diagnosis present

## 2021-12-01 DIAGNOSIS — E785 Hyperlipidemia, unspecified: Secondary | ICD-10-CM | POA: Diagnosis present

## 2021-12-01 DIAGNOSIS — Z8673 Personal history of transient ischemic attack (TIA), and cerebral infarction without residual deficits: Secondary | ICD-10-CM

## 2021-12-01 DIAGNOSIS — I120 Hypertensive chronic kidney disease with stage 5 chronic kidney disease or end stage renal disease: Secondary | ICD-10-CM | POA: Diagnosis present

## 2021-12-01 DIAGNOSIS — N2581 Secondary hyperparathyroidism of renal origin: Secondary | ICD-10-CM | POA: Diagnosis present

## 2021-12-01 DIAGNOSIS — J9601 Acute respiratory failure with hypoxia: Secondary | ICD-10-CM | POA: Diagnosis present

## 2021-12-01 DIAGNOSIS — I248 Other forms of acute ischemic heart disease: Secondary | ICD-10-CM | POA: Diagnosis present

## 2021-12-01 DIAGNOSIS — N186 End stage renal disease: Secondary | ICD-10-CM

## 2021-12-01 DIAGNOSIS — K219 Gastro-esophageal reflux disease without esophagitis: Secondary | ICD-10-CM | POA: Diagnosis present

## 2021-12-01 DIAGNOSIS — Z992 Dependence on renal dialysis: Secondary | ICD-10-CM

## 2021-12-01 LAB — COMPREHENSIVE METABOLIC PANEL
ALT: 26 U/L (ref 0–44)
AST: 39 U/L (ref 15–41)
Albumin: 3.4 g/dL — ABNORMAL LOW (ref 3.5–5.0)
Alkaline Phosphatase: 42 U/L (ref 38–126)
Anion gap: 13 (ref 5–15)
BUN: 61 mg/dL — ABNORMAL HIGH (ref 8–23)
CO2: 25 mmol/L (ref 22–32)
Calcium: 8.8 mg/dL — ABNORMAL LOW (ref 8.9–10.3)
Chloride: 101 mmol/L (ref 98–111)
Creatinine, Ser: 8.63 mg/dL — ABNORMAL HIGH (ref 0.61–1.24)
GFR, Estimated: 6 mL/min — ABNORMAL LOW (ref >60)
Glucose, Bld: 103 mg/dL — ABNORMAL HIGH (ref 70–99)
Potassium: 4.2 mmol/L (ref 3.5–5.1)
Sodium: 139 mmol/L (ref 135–145)
Total Bilirubin: 0.9 mg/dL (ref 0.3–1.2)
Total Protein: 6.4 g/dL — ABNORMAL LOW (ref 6.5–8.1)

## 2021-12-01 LAB — CBC WITH DIFFERENTIAL/PLATELET
Abs Immature Granulocytes: 0.05 10*3/uL (ref 0.00–0.07)
Basophils Absolute: 0 10*3/uL (ref 0.0–0.1)
Basophils Relative: 0 %
Eosinophils Absolute: 0.1 10*3/uL (ref 0.0–0.5)
Eosinophils Relative: 1 %
HCT: 30.9 % — ABNORMAL LOW (ref 39.0–52.0)
Hemoglobin: 9 g/dL — ABNORMAL LOW (ref 13.0–17.0)
Immature Granulocytes: 1 %
Lymphocytes Relative: 5 %
Lymphs Abs: 0.5 10*3/uL — ABNORMAL LOW (ref 0.7–4.0)
MCH: 27 pg (ref 26.0–34.0)
MCHC: 29.1 g/dL — ABNORMAL LOW (ref 30.0–36.0)
MCV: 92.8 fL (ref 80.0–100.0)
Monocytes Absolute: 1.2 10*3/uL — ABNORMAL HIGH (ref 0.1–1.0)
Monocytes Relative: 13 %
Neutro Abs: 7.6 10*3/uL (ref 1.7–7.7)
Neutrophils Relative %: 80 %
Platelets: 228 10*3/uL (ref 150–400)
RBC: 3.33 MIL/uL — ABNORMAL LOW (ref 4.22–5.81)
RDW: 14.4 % (ref 11.5–15.5)
WBC: 9.4 10*3/uL (ref 4.0–10.5)
nRBC: 0 % (ref 0.0–0.2)

## 2021-12-01 LAB — TROPONIN I (HIGH SENSITIVITY)
Troponin I (High Sensitivity): 75 ng/L — ABNORMAL HIGH (ref <18)
Troponin I (High Sensitivity): 67 ng/L — ABNORMAL HIGH (ref <18)

## 2021-12-01 MED ORDER — IPRATROPIUM-ALBUTEROL 0.5-2.5 (3) MG/3ML IN SOLN
3.0000 mL | Freq: Once | RESPIRATORY_TRACT | Status: AC
  Administered 2021-12-02: 3 mL via RESPIRATORY_TRACT
  Filled 2021-12-01: qty 3

## 2021-12-01 NOTE — ED Provider Notes (Signed)
Emergency Medicine Provider Triage Evaluation Note  Ryan Salazar , a 70 y.o. male  was evaluated in triage.  Pt complains of shortness of breath.  Patient states that he was on his way to dialysis this morning when he suddenly became diaphoretic and having increased shortness of breath.  He states that he has been short of breath for about a week now, however states acutely worsened this morning.  Per EMS his O2 is 90% on room air.  Was put on 6 L with return to 100%.  He is not on oxygen at home.  He also reports cough and chills over the last 7 days.  He denies chest pain or palpitations.  Last full dialysis treatment was Wednesday.  Review of Systems  Positive: See above Negative:   Physical Exam  BP (!) 150/81 (BP Location: Left Arm)   Pulse 87   Temp 99.3 F (37.4 C) (Oral)   Resp (!) 26   SpO2 99%  Gen:   Awake, no distress   Resp:  Tachypneic, decreased air movement in bilateral lungs.  Bilateral lower lobe crackles. MSK:   Moves extremities without difficulty  Other:  Right-sided AV fistula with positive thrill and bruit, abdomen is rounded and soft.  S1/S2 without murmur.  Medical Decision Making  Medically screening exam initiated at 12:18 PM.  Appropriate orders placed.  [name] was informed that the remainder of the evaluation will be completed by another provider, this initial triage assessment does not replace that evaluation, and the importance of remaining in the ED until their evaluation is complete.    Mickie Hillier, PA-C 12/01/21 1220    Horton, Alvin Critchley, DO 12/01/21 1647

## 2021-12-01 NOTE — ED Triage Notes (Addendum)
Patient BIB [facility] from dialysis where patient became diaphoretic and complained of shortness of breath during his treatment (patient did not begin treatment). SpO2 90% on room air, 100% on 6L. Patient reports cough and chills over the last several days. Denies chest pain, patient is alert, oriented, and in no apparent distress at this time. 20g saline lock in left AC.  Hr 90 100% on 6L [place]

## 2021-12-02 ENCOUNTER — Encounter (HOSPITAL_COMMUNITY): Payer: Self-pay | Admitting: Family Medicine

## 2021-12-02 ENCOUNTER — Other Ambulatory Visit: Payer: Self-pay

## 2021-12-02 DIAGNOSIS — Z8673 Personal history of transient ischemic attack (TIA), and cerebral infarction without residual deficits: Secondary | ICD-10-CM | POA: Diagnosis not present

## 2021-12-02 DIAGNOSIS — R778 Other specified abnormalities of plasma proteins: Secondary | ICD-10-CM | POA: Diagnosis not present

## 2021-12-02 DIAGNOSIS — Z66 Do not resuscitate: Secondary | ICD-10-CM | POA: Diagnosis present

## 2021-12-02 DIAGNOSIS — G4730 Sleep apnea, unspecified: Secondary | ICD-10-CM | POA: Diagnosis present

## 2021-12-02 DIAGNOSIS — J9601 Acute respiratory failure with hypoxia: Secondary | ICD-10-CM | POA: Diagnosis present

## 2021-12-02 DIAGNOSIS — F17210 Nicotine dependence, cigarettes, uncomplicated: Secondary | ICD-10-CM | POA: Diagnosis present

## 2021-12-02 DIAGNOSIS — D631 Anemia in chronic kidney disease: Secondary | ICD-10-CM | POA: Diagnosis present

## 2021-12-02 DIAGNOSIS — M1990 Unspecified osteoarthritis, unspecified site: Secondary | ICD-10-CM | POA: Diagnosis present

## 2021-12-02 DIAGNOSIS — R7989 Other specified abnormal findings of blood chemistry: Secondary | ICD-10-CM | POA: Diagnosis present

## 2021-12-02 DIAGNOSIS — Z79899 Other long term (current) drug therapy: Secondary | ICD-10-CM | POA: Diagnosis not present

## 2021-12-02 DIAGNOSIS — J4540 Moderate persistent asthma, uncomplicated: Secondary | ICD-10-CM | POA: Diagnosis present

## 2021-12-02 DIAGNOSIS — I10 Essential (primary) hypertension: Secondary | ICD-10-CM | POA: Diagnosis not present

## 2021-12-02 DIAGNOSIS — Z992 Dependence on renal dialysis: Secondary | ICD-10-CM | POA: Diagnosis not present

## 2021-12-02 DIAGNOSIS — Z20822 Contact with and (suspected) exposure to covid-19: Secondary | ICD-10-CM | POA: Diagnosis present

## 2021-12-02 DIAGNOSIS — E785 Hyperlipidemia, unspecified: Secondary | ICD-10-CM | POA: Diagnosis present

## 2021-12-02 DIAGNOSIS — N186 End stage renal disease: Secondary | ICD-10-CM | POA: Diagnosis present

## 2021-12-02 DIAGNOSIS — I120 Hypertensive chronic kidney disease with stage 5 chronic kidney disease or end stage renal disease: Secondary | ICD-10-CM | POA: Diagnosis present

## 2021-12-02 DIAGNOSIS — Z7982 Long term (current) use of aspirin: Secondary | ICD-10-CM | POA: Diagnosis not present

## 2021-12-02 DIAGNOSIS — K219 Gastro-esophageal reflux disease without esophagitis: Secondary | ICD-10-CM | POA: Diagnosis present

## 2021-12-02 DIAGNOSIS — N2581 Secondary hyperparathyroidism of renal origin: Secondary | ICD-10-CM | POA: Diagnosis present

## 2021-12-02 DIAGNOSIS — I248 Other forms of acute ischemic heart disease: Secondary | ICD-10-CM | POA: Diagnosis present

## 2021-12-02 DIAGNOSIS — M898X9 Other specified disorders of bone, unspecified site: Secondary | ICD-10-CM | POA: Diagnosis present

## 2021-12-02 DIAGNOSIS — J101 Influenza due to other identified influenza virus with other respiratory manifestations: Secondary | ICD-10-CM | POA: Diagnosis present

## 2021-12-02 DIAGNOSIS — E46 Unspecified protein-calorie malnutrition: Secondary | ICD-10-CM | POA: Diagnosis present

## 2021-12-02 DIAGNOSIS — E441 Mild protein-calorie malnutrition: Secondary | ICD-10-CM

## 2021-12-02 HISTORY — DX: Other specified abnormal findings of blood chemistry: R79.89

## 2021-12-02 LAB — RESP PANEL BY RT-PCR (FLU A&B, COVID) ARPGX2
Influenza A by PCR: POSITIVE — AB
Influenza B by PCR: NEGATIVE
SARS Coronavirus 2 by RT PCR: NEGATIVE

## 2021-12-02 LAB — MRSA NEXT GEN BY PCR, NASAL: MRSA by PCR Next Gen: NOT DETECTED

## 2021-12-02 LAB — HIV ANTIBODY (ROUTINE TESTING W REFLEX): HIV Screen 4th Generation wRfx: NONREACTIVE

## 2021-12-02 MED ORDER — IPRATROPIUM-ALBUTEROL 0.5-2.5 (3) MG/3ML IN SOLN
3.0000 mL | Freq: Four times a day (QID) | RESPIRATORY_TRACT | Status: DC
  Administered 2021-12-03 (×3): 3 mL via RESPIRATORY_TRACT
  Filled 2021-12-02 (×2): qty 3

## 2021-12-02 MED ORDER — IPRATROPIUM-ALBUTEROL 0.5-2.5 (3) MG/3ML IN SOLN
3.0000 mL | Freq: Four times a day (QID) | RESPIRATORY_TRACT | Status: DC
Start: 2021-12-02 — End: 2021-12-02
  Administered 2021-12-02: 3 mL via RESPIRATORY_TRACT
  Filled 2021-12-02: qty 3

## 2021-12-02 MED ORDER — AMLODIPINE BESYLATE 10 MG PO TABS
10.0000 mg | ORAL_TABLET | Freq: Every day | ORAL | Status: DC
  Administered 2021-12-02 – 2021-12-04 (×3): 10 mg via ORAL
  Filled 2021-12-02: qty 1
  Filled 2021-12-02: qty 2
  Filled 2021-12-02 (×2): qty 1

## 2021-12-02 MED ORDER — PANTOPRAZOLE SODIUM 40 MG PO TBEC
40.0000 mg | DELAYED_RELEASE_TABLET | Freq: Every day | ORAL | Status: DC
  Administered 2021-12-02 – 2021-12-04 (×3): 40 mg via ORAL
  Filled 2021-12-02 (×3): qty 1

## 2021-12-02 MED ORDER — ALBUTEROL SULFATE (2.5 MG/3ML) 0.083% IN NEBU: 2.5000 mg | INHALATION_SOLUTION | RESPIRATORY_TRACT | Status: DC | PRN

## 2021-12-02 MED ORDER — FERROUS GLUCONATE 324 (38 FE) MG PO TABS
324.0000 mg | ORAL_TABLET | Freq: Every day | ORAL | Status: DC
  Administered 2021-12-02 – 2021-12-04 (×2): 324 mg via ORAL
  Filled 2021-12-02 (×4): qty 1

## 2021-12-02 MED ORDER — ONDANSETRON HCL 4 MG PO TABS: 4.0000 mg | ORAL_TABLET | Freq: Four times a day (QID) | ORAL | Status: DC | PRN

## 2021-12-02 MED ORDER — DOXAZOSIN MESYLATE 4 MG PO TABS
4.0000 mg | ORAL_TABLET | Freq: Once | ORAL | Status: AC
  Administered 2021-12-02: 4 mg via ORAL
  Filled 2021-12-02: qty 1

## 2021-12-02 MED ORDER — CALCITRIOL 0.25 MCG PO CAPS
0.2500 ug | ORAL_CAPSULE | ORAL | Status: DC
  Administered 2021-12-04: 0.25 ug via ORAL
  Filled 2021-12-02: qty 1

## 2021-12-02 MED ORDER — ONDANSETRON HCL 4 MG/2ML IJ SOLN: 4.0000 mg | Freq: Four times a day (QID) | INTRAMUSCULAR | Status: DC | PRN

## 2021-12-02 MED ORDER — OSELTAMIVIR PHOSPHATE 30 MG PO CAPS
30.0000 mg | ORAL_CAPSULE | ORAL | Status: DC
  Administered 2021-12-02 – 2021-12-04 (×2): 30 mg via ORAL
  Filled 2021-12-02 (×2): qty 1

## 2021-12-02 MED ORDER — ACETAMINOPHEN 325 MG PO TABS
650.0000 mg | ORAL_TABLET | Freq: Once | ORAL | Status: AC
  Administered 2021-12-02: 650 mg via ORAL
  Filled 2021-12-02: qty 2

## 2021-12-02 MED ORDER — METHYLPREDNISOLONE SODIUM SUCC 40 MG IJ SOLR
40.0000 mg | Freq: Every day | INTRAMUSCULAR | Status: DC
  Administered 2021-12-02 – 2021-12-04 (×3): 40 mg via INTRAVENOUS
  Filled 2021-12-02 (×3): qty 1

## 2021-12-02 MED ORDER — ACETAMINOPHEN 650 MG RE SUPP: 650.0000 mg | Freq: Four times a day (QID) | RECTAL | Status: DC | PRN

## 2021-12-02 MED ORDER — CALCITRIOL 0.25 MCG PO CAPS
0.2500 ug | ORAL_CAPSULE | Freq: Every day | ORAL | Status: DC
  Administered 2021-12-02: 0.25 ug via ORAL
  Filled 2021-12-02: qty 1

## 2021-12-02 MED ORDER — IPRATROPIUM-ALBUTEROL 0.5-2.5 (3) MG/3ML IN SOLN
3.0000 mL | Freq: Once | RESPIRATORY_TRACT | Status: AC
  Administered 2021-12-02: 3 mL via RESPIRATORY_TRACT
  Filled 2021-12-02: qty 3

## 2021-12-02 MED ORDER — NITROGLYCERIN 2 % TD OINT
1.0000 [in_us] | TOPICAL_OINTMENT | Freq: Once | TRANSDERMAL | Status: DC
  Filled 2021-12-02: qty 1

## 2021-12-02 MED ORDER — OXYCODONE HCL 5 MG PO TABS: 5.0000 mg | ORAL_TABLET | ORAL | Status: DC | PRN

## 2021-12-02 MED ORDER — LABETALOL HCL 200 MG PO TABS
300.0000 mg | ORAL_TABLET | Freq: Once | ORAL | Status: AC
Start: 2021-12-02 — End: 2021-12-02
  Administered 2021-12-02: 300 mg via ORAL
  Filled 2021-12-02: qty 2

## 2021-12-02 MED ORDER — FUROSEMIDE 40 MG PO TABS
40.0000 mg | ORAL_TABLET | Freq: Once | ORAL | Status: AC
  Administered 2021-12-02: 40 mg via ORAL
  Filled 2021-12-02: qty 1

## 2021-12-02 MED ORDER — ASPIRIN EC 81 MG PO TBEC
81.0000 mg | DELAYED_RELEASE_TABLET | Freq: Every day | ORAL | Status: DC
  Administered 2021-12-02 – 2021-12-04 (×3): 81 mg via ORAL
  Filled 2021-12-02 (×3): qty 1

## 2021-12-02 MED ORDER — HYDRALAZINE HCL 25 MG PO TABS
25.0000 mg | ORAL_TABLET | Freq: Two times a day (BID) | ORAL | Status: DC
  Administered 2021-12-02 – 2021-12-04 (×5): 25 mg via ORAL
  Filled 2021-12-02 (×5): qty 1

## 2021-12-02 MED ORDER — PROSOURCE PLUS PO LIQD
30.0000 mL | Freq: Two times a day (BID) | ORAL | Status: DC
  Administered 2021-12-03 – 2021-12-04 (×2): 30 mL via ORAL
  Filled 2021-12-02 (×2): qty 30

## 2021-12-02 MED ORDER — DOXAZOSIN MESYLATE 4 MG PO TABS
4.0000 mg | ORAL_TABLET | Freq: Two times a day (BID) | ORAL | Status: DC
  Administered 2021-12-02 – 2021-12-04 (×4): 4 mg via ORAL
  Filled 2021-12-02 (×5): qty 1

## 2021-12-02 MED ORDER — LABETALOL HCL 300 MG PO TABS
300.0000 mg | ORAL_TABLET | Freq: Two times a day (BID) | ORAL | Status: DC
  Administered 2021-12-02 – 2021-12-04 (×5): 300 mg via ORAL
  Filled 2021-12-02 (×5): qty 1
  Filled 2021-12-02: qty 2

## 2021-12-02 MED ORDER — ACETAMINOPHEN 325 MG PO TABS: 650.0000 mg | ORAL_TABLET | Freq: Four times a day (QID) | ORAL | Status: DC | PRN

## 2021-12-02 MED ORDER — ATORVASTATIN CALCIUM 40 MG PO TABS
40.0000 mg | ORAL_TABLET | Freq: Every day | ORAL | Status: DC
  Administered 2021-12-02 – 2021-12-04 (×3): 40 mg via ORAL
  Filled 2021-12-02 (×3): qty 1

## 2021-12-02 MED ORDER — IPRATROPIUM-ALBUTEROL 0.5-2.5 (3) MG/3ML IN SOLN
3.0000 mL | Freq: Three times a day (TID) | RESPIRATORY_TRACT | Status: DC
  Administered 2021-12-02 (×2): 3 mL via RESPIRATORY_TRACT
  Filled 2021-12-02 (×2): qty 3

## 2021-12-02 MED ORDER — HEPARIN SODIUM (PORCINE) 5000 UNIT/ML IJ SOLN
5000.0000 [IU] | Freq: Three times a day (TID) | INTRAMUSCULAR | Status: DC
  Administered 2021-12-02 – 2021-12-04 (×7): 5000 [IU] via SUBCUTANEOUS
  Filled 2021-12-02 (×7): qty 1

## 2021-12-02 NOTE — H&P (Signed)
TRH H&P    Patient Demographics:    Ryan Salazar, is a 70 y.o. male  MRN: [id number]  DOB - [birth year]  Admit Date - 12/01/2021  Referring MD/NP/PA: Leonette Monarch  Outpatient Primary MD for the patient is [name], [place]  Patient coming from: Home  Chief complaint- Dyspnea   HPI:    Ryan Salazar  is a 70 y.o. male, with history of anemia, ESRD on HD, hypertension, hyperlipidemia, GERD, stroke, presents ED with a chief complaint of dyspnea and near syncope.  Patient reports he had sudden onset dyspnea during dialysis.  He reports that he has had URI symptoms for the past 2 days, but he never felt like the way he did in dialysis today.  He felt he could not get a good breath.  He had chest pain but only when he coughed.  When he coughed he was productive of white sputum, but no blood.  Patient reports that he has had fever and chills for the last 2 days.  He is a dialysis patient he still makes urine.  He reports no change in his urine output or dysuria.  Patient reports no sick contacts.  Patient does not have any other body aches or complaints for me at this time.  Dialysis is Monday Wednesday Friday with a right arm fistula.  Patient does not smoke.  He does drink 2 beers per weekend.  He uses marijuana and the last time he used was last weekend.  He does not use cocaine or heroin.  Patient is vaccinated for COVID.  Patient is DNR.  In the ED Temp 99.2-101.8, respiratory rate 16-29, heart rate 79-95, blood pressure 152/91, satting 97% on 2 L nasal cannula No leukocytosis with white blood cell count 9.4, hemoglobin 9.0, platelets 228 Chemistry panel reveals a BUN of 61, creatinine 8.6. Calcium 8.8 Albumin 3.4 Downtrending troponin 75-67 Positive for flu Chest x-ray shows no cardiopulmonary disease EKG shows a rate of 87, sinus rhythm, QTC 454 Patient was given his Cardura, 2 duo nebs, Tylenol, labetalol in the  ED Admission requested for acute respiratory failure with hypoxia and wheezing    Review of systems:    In addition to the HPI above,  No Fever-chills, No Headache, No changes with Vision or hearing, No problems swallowing food or Liquids, Admits to dyspnea and cough and musculoskeletal chest pain No Abdominal pain, No Nausea or Vomiting, bowel movements are regular, No Blood in stool or Urine, No dysuria, No new skin rashes or bruises, No new joints pains-aches,  No new weakness, tingling, numbness in any extremity, No recent weight gain or loss, No polyuria, polydypsia or polyphagia, No significant Mental Stressors.  All other systems reviewed and are negative.    Past History of the following :    Past Medical History:  Diagnosis Date   Anemia    Arthritis    Blood transfusion 1989   Chronic kidney disease    Dyspnea    on exertion - no oxygen   GERD (gastroesophageal reflux disease)    HLD (  hyperlipidemia)    Hypertension    Sleep apnea    Stroke ([place]) 07/2019   no residual   Ulcer 1989   BLEEDING STOMACH ULCER      Past Surgical History:  Procedure Laterality Date   AV FISTULA PLACEMENT Right 05/02/2021   Procedure: RIGHT ARM ARTERIOVENOUS (AV) FISTULA CREATION;  Surgeon: Waynetta Sandy, MD;  Location: [facility] OR;  Service: Vascular;  Laterality: Right;   COLONOSCOPY     INGUINAL HERNIA REPAIR  1973   TONSILLECTOMY  AS CHILD   WISDOM TOOTH EXTRACTION        Social History:      Social History   Tobacco Use   Smoking status: Some Days    Packs/day: 0.25    Years: 50.00    Pack years: 12.50    Types: Cigarettes   Smokeless tobacco: Never   Tobacco comments:    3 cigaretts per day  Substance Use Topics   Alcohol use: Yes    Comment: occasional       Family History :     Family History  Problem Relation Age of Onset   Stroke Mother    Healthy Father    Colon cancer Neg Hx    Colon polyps Neg Hx       Home Medications:    Prior to Admission medications   Medication Sig Start Date End Date Taking? Authorizing Provider  amLODipine (NORVASC) 10 MG tablet TAKE 1 TABLET BY MOUTH EVERY DAY 11/10/21   [name], FNP  aspirin EC 81 MG EC tablet Take 1 tablet (81 mg total) by mouth daily. 07/24/19   Shelly Coss, MD  atorvastatin (LIPITOR) 40 MG tablet TAKE 1 TABLET BY MOUTH DAILY AT 6 PM. 06/21/21   [name], FNP  calcitRIOL (ROCALTROL) 0.5 MCG capsule Take 0.25 mcg by mouth daily.    [provider]  doxazosin (CARDURA) 4 MG tablet TAKE 1 TABLET BY MOUTH TWICE A DAY 08/14/21   [name], FNP  ferrous gluconate (FERGON) 240 (27 FE) MG tablet Take 240 mg by mouth daily.    [provider]  furosemide (LASIX) 80 MG tablet Take 80 mg by mouth 2 (two) times daily. 08/12/11   [provider]  hydrALAZINE (APRESOLINE) 25 MG tablet Take 1 tablet (25 mg total) by mouth 3 (three) times daily. Patient taking differently: Take 25 mg by mouth 2 (two) times daily. 07/07/20 07/07/21  [name], FNP  KLOR-CON M20 20 MEQ tablet Take 20 mEq by mouth Daily.  07/22/11   [provider]  labetalol (NORMODYNE) 300 MG tablet TAKE 1 TABLET BY MOUTH TWICE A DAY 08/21/21   [name], FNP  Multiple Vitamins-Minerals (MULTIVITAMIN WITH MINERALS) tablet Take 1 tablet by mouth daily.    [provider]  omeprazole (PRILOSEC) 20 MG capsule Take 20 mg by mouth daily. 07/23/11   [provider]     Allergies:    No Known Allergies   Physical Exam:   Vitals  Blood pressure (!) 164/93, pulse 88, temperature 99.2 F (37.3 C), temperature source Oral, resp. rate (!) 23, height 5\' 11"  (1.803 m), weight 88.5 kg, SpO2 100 %.   1.  General: Patient lying supine in bed,  no acute distress   2. Psychiatric: Alert and oriented x 3, mood and behavior normal for situation, pleasant and cooperative with exam   3. Neurologic: Speech and language are normal, face is symmetric, moves  all 4 extremities voluntarily, at baseline without  acute deficits on limited exam   4. HEENMT:  Head is atraumatic, normocephalic, pupils reactive to light, neck is supple, trachea is midline, mucous membranes are moist   5. Respiratory : Lungs are clear to auscultation bilaterally without wheezing at the time of my exam, no rhonchi, rales, no cyanosis, no increase in work of breathing or accessory muscle use   6. Cardiovascular : Heart rate normal, rhythm is regular, no murmurs, rubs or gallops, peripheral pulses palpated   7. Gastrointestinal:  Abdomen is soft, nondistended, nontender to palpation bowel sounds active, no masses or organomegaly palpated   8. Skin:  Skin is warm, dry and intact without rashes, acute lesions, or ulcers on limited exam   9.Musculoskeletal:  No acute deformities or trauma, no asymmetry in tone, peripheral edema present peripheral pulses palpated, no tenderness to palpation in the extremities     Data Review:    CBC Recent Labs  Lab 12/01/21 1235  WBC 9.4  HGB 9.0*  HCT 30.9*  PLT 228  MCV 92.8  MCH 27.0  MCHC 29.1*  RDW 14.4  LYMPHSABS 0.5*  MONOABS 1.2*  EOSABS 0.1  BASOSABS 0.0   ------------------------------------------------------------------------------------------------------------------  Results for orders placed or performed during the hospital encounter of 12/01/21 (from the past 48 hour(s))  Comprehensive metabolic panel     Status: Abnormal   Collection Time: 12/01/21 12:35 PM  Result Value Ref Range   Sodium 139 135 - 145 mmol/L   Potassium 4.2 3.5 - 5.1 mmol/L   Chloride 101 98 - 111 mmol/L   CO2 25 22 - 32 mmol/L   Glucose, Bld 103 (H) 70 - 99 mg/dL    Comment: Glucose reference range applies only to samples taken after fasting for at least 8 hours.   BUN 61 (H) 8 - 23 mg/dL   Creatinine, Ser 8.63 (H) 0.61 - 1.24 mg/dL   Calcium 8.8 (L) 8.9 - 10.3 mg/dL   Total Protein 6.4 (L) 6.5 - 8.1 g/dL   Albumin 3.4 (L) 3.5 -  5.0 g/dL   AST 39 15 - 41 U/L   ALT 26 0 - 44 U/L   Alkaline Phosphatase 42 38 - 126 U/L   Total Bilirubin 0.9 0.3 - 1.2 mg/dL   GFR, Estimated 6 (L) >60 mL/min    Comment: (NOTE) Calculated using the CKD-EPI Creatinine Equation (2021)    Anion gap 13 5 - 15    Comment: Performed at [facility], [place] [address] [place], [place] [address]  Troponin I (High Sensitivity)     Status: Abnormal   Collection Time: 12/01/21 12:35 PM  Result Value Ref Range   Troponin I (High Sensitivity) 75 (H) <18 ng/L    Comment: (NOTE) Elevated high sensitivity troponin I (hsTnI) values and significant  changes across serial measurements may suggest ACS but many other  chronic and acute conditions are known to elevate hsTnI results.  Refer to the "Links" section for chest pain algorithms and additional  guidance. Performed at [facility], [place] [address] [place], [place] [address]   CBC with Differential     Status: Abnormal   Collection Time: 12/01/21 12:35 PM  Result Value Ref Range   WBC 9.4 4.0 - 10.5 K/uL   RBC 3.33 (L) 4.22 - 5.81 MIL/uL   Hemoglobin 9.0 (L) 13.0 - 17.0 g/dL   HCT 30.9 (L) 39.0 - 52.0 %   MCV 92.8 80.0 - 100.0 fL   MCH 27.0 26.0 - 34.0  pg   MCHC 29.1 (L) 30.0 - 36.0 g/dL   RDW 14.4 11.5 - 15.5 %   Platelets 228 150 - 400 K/uL   nRBC 0.0 0.0 - 0.2 %   Neutrophils Relative % 80 %   Neutro Abs 7.6 1.7 - 7.7 K/uL   Lymphocytes Relative 5 %   Lymphs Abs 0.5 (L) 0.7 - 4.0 K/uL   Monocytes Relative 13 %   Monocytes Absolute 1.2 (H) 0.1 - 1.0 K/uL   Eosinophils Relative 1 %   Eosinophils Absolute 0.1 0.0 - 0.5 K/uL   Basophils Relative 0 %   Basophils Absolute 0.0 0.0 - 0.1 K/uL   Immature Granulocytes 1 %   Abs Immature Granulocytes 0.05 0.00 - 0.07 K/uL    Comment: Performed at [facility] [address] [place], [place] [address]  Troponin I (High Sensitivity)     Status: Abnormal   Collection Time: 12/01/21  5:01 PM  Result Value Ref Range    Troponin I (High Sensitivity) 67 (H) <18 ng/L    Comment: (NOTE) Elevated high sensitivity troponin I (hsTnI) values and significant  changes across serial measurements may suggest ACS but many other  chronic and acute conditions are known to elevate hsTnI results.  Refer to the "Links" section for chest pain algorithms and additional  guidance. Performed at [facility], [place] [address], [place], [place] [address]   Resp Panel by RT-PCR (Flu A&B, Covid) Nasopharyngeal Swab     Status: Abnormal   Collection Time: 12/01/21 11:35 PM   Specimen: Nasopharyngeal Swab; Nasopharyngeal(NP) swabs in vial transport medium  Result Value Ref Range   SARS Coronavirus 2 by RT PCR NEGATIVE NEGATIVE    Comment: (NOTE) SARS-CoV-2 target nucleic acids are NOT DETECTED.  The SARS-CoV-2 RNA is generally detectable in upper respiratory specimens during the acute phase of infection. The lowest concentration of SARS-CoV-2 viral copies this assay can detect is 138 copies/mL. A negative result does not preclude SARS-Cov-2 infection and should not be used as the sole basis for treatment or other patient management decisions. A negative result may occur with  improper specimen collection/handling, submission of specimen other than nasopharyngeal swab, presence of viral mutation(s) within the areas targeted by this assay, and inadequate number of viral copies(<138 copies/mL). A negative result must be combined with clinical observations, patient history, and epidemiological information. The expected result is Negative.  Fact Sheet for Patients:  EntrepreneurPulse.com.au  Fact Sheet for Healthcare Providers:  IncredibleEmployment.be  This test is no t yet approved or cleared by the [place] FDA and  has been authorized for detection and/or diagnosis of SARS-CoV-2 by FDA under an Emergency Use Authorization (EUA). This EUA will remain  in effect (meaning this  test can be used) for the duration of the COVID-19 declaration under Section 564(b)(1) of the Act, 21 U.S.C.section 360bbb-3(b)(1), unless the authorization is terminated  or revoked sooner.       Influenza A by PCR POSITIVE (A) NEGATIVE   Influenza B by PCR NEGATIVE NEGATIVE    Comment: (NOTE) The Xpert Xpress SARS-CoV-2/FLU/RSV plus assay is intended as an aid in the diagnosis of influenza from Nasopharyngeal swab specimens and should not be used as a sole basis for treatment. Nasal washings and aspirates are unacceptable for Xpert Xpress SARS-CoV-2/FLU/RSV testing.  Fact Sheet for Patients: EntrepreneurPulse.com.au  Fact Sheet for Healthcare Providers: IncredibleEmployment.be  This test is not yet approved or cleared by the [place] FDA and has been  authorized for detection and/or diagnosis of SARS-CoV-2 by FDA under an Emergency Use Authorization (EUA). This EUA will remain in effect (meaning this test can be used) for the duration of the COVID-19 declaration under Section 564(b)(1) of the Act, 21 U.S.C. section 360bbb-3(b)(1), unless the authorization is terminated or revoked.  Performed at [facility], [place] [address] [place], [place] [address]     Chemistries  Recent Labs  Lab 12/01/21 1235  NA 139  K 4.2  CL 101  CO2 25  GLUCOSE 103*  BUN 61*  CREATININE 8.63*  CALCIUM 8.8*  AST 39  ALT 26  ALKPHOS 42  BILITOT 0.9   ------------------------------------------------------------------------------------------------------------------  ------------------------------------------------------------------------------------------------------------------ GFR: Estimated Creatinine Clearance: 8.6 mL/min (A) (by C-G formula based on SCr of 8.63 mg/dL (H)). Liver Function Tests: Recent Labs  Lab 12/01/21 1235  AST 39  ALT 26  ALKPHOS 42  BILITOT 0.9  PROT 6.4*  ALBUMIN 3.4*   No results for input(s): LIPASE,  AMYLASE in the last 168 hours. No results for input(s): AMMONIA in the last 168 hours. Coagulation Profile: No results for input(s): INR, PROTIME in the last 168 hours. Cardiac Enzymes: No results for input(s): CKTOTAL, CKMB, CKMBINDEX, TROPONINI in the last 168 hours. BNP (last 3 results) No results for input(s): PROBNP in the last 8760 hours. HbA1C: No results for input(s): HGBA1C in the last 72 hours. CBG: No results for input(s): GLUCAP in the last 168 hours. Lipid Profile: No results for input(s): CHOL, HDL, LDLCALC, TRIG, CHOLHDL, LDLDIRECT in the last 72 hours. Thyroid Function Tests: No results for input(s): TSH, T4TOTAL, FREET4, T3FREE, THYROIDAB in the last 72 hours. Anemia Panel: No results for input(s): VITAMINB12, FOLATE, FERRITIN, TIBC, IRON, RETICCTPCT in the last 72 hours.  --------------------------------------------------------------------------------------------------------------- Urine analysis:    Component Value Date/Time   BILIRUBINUR negative 12/07/2020 1604   PROTEINUR Positive (A) 12/07/2020 1604   UROBILINOGEN 0.2 12/07/2020 1604   NITRITE negative 12/07/2020 1604   LEUKOCYTESUR Trace (A) 12/07/2020 1604      Imaging Results:    DG Chest 2 View  Result Date: 12/01/2021 CLINICAL DATA:  Shortness of breath EXAM: CHEST - 2 VIEW COMPARISON:  None. FINDINGS: The heart size and mediastinal contours are within normal limits. Both lungs are clear. Linear atelectasis along the right minor fissure. Elevation of the right hemidiaphragm. The visualized skeletal structures are unremarkable. IMPRESSION: No active cardiopulmonary disease. Electronically Signed   By: Keane Police D.O.   On: 12/01/2021 12:50    My personal review of EKG: Rhythm NSR, Rate 87 /min, QTc 454 ,no Acute ST changes   Assessment & Plan:    Principal Problem:   Acute respiratory failure with hypoxia (HCC) Active Problems:   Essential hypertension   Influenza A   Protein calorie  malnutrition (HCC)   Elevated troponin   ESRD (end stage renal disease) (HCC)   Acute respiratory failure with hypoxia Requiring 2 L nasal cannula to maintain oxygen saturations Secondary to influenza A Chest x-ray shows no cardiopulmonary disease Wean off O2 as tolerated Albuterol as needed Scheduled DuoNebs Continue to monitor Influenza A Continue Tamiflu Continue supportive care Essential hypertension Continue amlodipine, Cardura, hydralazine, labetalol Continue to monitor Mild protein calorie malnutrition Encourage nutrient dense food choices Elevated troponin Demand ischemia in the setting of hypoxia Downtrending EKG without ischemic changes Monitor on telemetry Currently chest pain-free ESRD Consult nephro for routine dialysis GERD Continue PPI    DVT Prophylaxis-   Heparin - SCDs   AM  Labs Ordered, also please review Full Orders  Family Communication: No family at bedside  Code Status: DNR  Admission status: Inpatient :The appropriate admission status for this patient is INPATIENT. Inpatient status is judged to be reasonable and necessary in order to provide the required intensity of service to ensure the patient's safety. The patient's presenting symptoms, physical exam findings, and initial radiographic and laboratory data in the context of their chronic comorbidities is felt to place them at high risk for further clinical deterioration. Furthermore, it is not anticipated that the patient will be medically stable for discharge from the hospital within 2 midnights of admission. The following factors support the admission status of inpatient.     The patient's presenting symptoms include dyspnea. The worrisome physical exam findings include hypoxia. The initial radiographic and laboratory data are worrisome because of elevated troponin. The chronic co-morbidities include ESRD, hypertension.       * I certify that at the point of admission it is my clinical  judgment that the patient will require inpatient hospital care spanning beyond 2 midnights from the point of admission due to high intensity of service, high risk for further deterioration and high frequency of surveillance required.*  Disposition: Anticipated Discharge date 72 hours Discharge to home  Time spent in minutes : [place] DO

## 2021-12-02 NOTE — Consult Note (Signed)
[facility] Renal Consultation Note    Indication for Consultation:  Management of ESRD/hemodialysis, anemia, hypertension/volume, and secondary hyperparathyroidism. PCP:  HPI: Ryan Salazar is a 70 y.o. male with ESRD (new - started dialysis this week), HTN, Hx CVA, sleep apnea, and GERD who was admitted with acute respiratory failure -> Dx with Flu.  Says began to start to feel poorly on Wed night with body aches and weakness, on Thursday he worked some, but then felt bad again in the evening and was very congested with cough and did not sleep at all that night. On Friday, he says he felt awful, so weak. Ongoing chills and cough. Managed to make it to the HD clinic but when he got there he was very dyspneic and hypoxic in 80's, O2 placed and EMS called. In ED, BP was ok. Flu A positive, COVID negative. Labs with K 4.2, BUN 61, Cr 8.6, Ca 8.8, Trop 75 -> 67, WBC 9.4, Hgb 9. EKG with NSR with occ PVC. CXR clear.  He was given Tamiflu and given duonebs with some improvement in symptoms.  Today, he appears comfortable on nasal O2. Still with cough and congestion. No CP. No N/V/D.  Dialyzes on MWF schedule at [facility] clinic -> just started on Monday, has only had 2 treatments as outpatient. Uses R AVF without issues. No dyspnea prior to getting sick.  Past Medical History:  Diagnosis Date   Anemia    Arthritis    Blood transfusion 1989   Chronic kidney disease    Dyspnea    on exertion - no oxygen   GERD (gastroesophageal reflux disease)    HLD (hyperlipidemia)    Hypertension    Sleep apnea    Stroke ([place]) 07/2019   no residual   Ulcer 1989   BLEEDING STOMACH ULCER   Past Surgical History:  Procedure Laterality Date   AV FISTULA PLACEMENT Right 05/02/2021   Procedure: RIGHT ARM ARTERIOVENOUS (AV) FISTULA CREATION;  Surgeon: Waynetta Sandy, MD;  Location: [facility] OR;  Service: Vascular;  Laterality: Right;   COLONOSCOPY     INGUINAL HERNIA REPAIR  1973    TONSILLECTOMY  AS CHILD   WISDOM TOOTH EXTRACTION     Family History  Problem Relation Age of Onset   Stroke Mother    Healthy Father    Colon cancer Neg Hx    Colon polyps Neg Hx    Social History:  reports that he has been smoking cigarettes. He has a 12.50 pack-year smoking history. He has never used smokeless tobacco. He reports current alcohol use. He reports current drug use. Drug: Marijuana.  ROS: As per HPI otherwise negative.  Physical Exam: Vitals:   12/02/21 0615 12/02/21 0758 12/02/21 0925 12/02/21 0945  BP: (!) 161/87  (!) 160/94 (!) 152/95  Pulse: 90  96 95  Resp: (!) 24  19 19   Temp:      TempSrc:      SpO2: 99% 100% 99% 98%  Weight:      Height:         General: Well developed, well nourished, in no acute distress, on nasal O2 3L/min. Head: Normocephalic, atraumatic, sclera non-icteric, mucus membranes are moist. Neck: Supple without lymphadenopathy/masses. JVD not elevated. Lungs: Expiratory rhonchi throughout all lung fields, no rales Heart: RRR with normal S1, S2. No murmurs, rubs, or gallops appreciated. Abdomen: Soft, non-tender, non-distended with normoactive bowel sounds.  Musculoskeletal:  Strength and tone appear normal for age. Lower extremities: No edema  or ischemic changes, no open wounds. Neuro: Alert and oriented X 3. Moves all extremities spontaneously. Psych:  Responds to questions appropriately with a normal affect. Dialysis Access: RUE AVF + bruit/thrill  No Known Allergies Prior to Admission medications   Medication Sig Start Date End Date Taking? Authorizing Provider  amLODipine (NORVASC) 10 MG tablet TAKE 1 TABLET BY MOUTH EVERY DAY 11/10/21   [name], FNP  aspirin EC 81 MG EC tablet Take 1 tablet (81 mg total) by mouth daily. 07/24/19   Shelly Coss, MD  atorvastatin (LIPITOR) 40 MG tablet TAKE 1 TABLET BY MOUTH DAILY AT 6 PM. 06/21/21   [name], FNP  calcitRIOL (ROCALTROL) 0.5 MCG capsule Take 0.25 mcg by mouth daily.     [provider]  doxazosin (CARDURA) 4 MG tablet TAKE 1 TABLET BY MOUTH TWICE A DAY 08/14/21   [name], FNP  ferrous gluconate (FERGON) 240 (27 FE) MG tablet Take 240 mg by mouth daily.    [provider]  furosemide (LASIX) 80 MG tablet Take 80 mg by mouth 2 (two) times daily. 08/12/11   [provider]  hydrALAZINE (APRESOLINE) 25 MG tablet Take 1 tablet (25 mg total) by mouth 3 (three) times daily. Patient taking differently: Take 25 mg by mouth 2 (two) times daily. 07/07/20 07/07/21  [name], FNP  KLOR-CON M20 20 MEQ tablet Take 20 mEq by mouth Daily.  07/22/11   [provider]  labetalol (NORMODYNE) 300 MG tablet TAKE 1 TABLET BY MOUTH TWICE A DAY 08/21/21   [name], FNP  Multiple Vitamins-Minerals (MULTIVITAMIN WITH MINERALS) tablet Take 1 tablet by mouth daily.    [provider]  omeprazole (PRILOSEC) 20 MG capsule Take 20 mg by mouth daily. 07/23/11   [provider]   Current Facility-Administered Medications  Medication Dose Route Frequency Provider Last Rate Last Admin   acetaminophen (TYLENOL) tablet 650 mg  650 mg Oral Q6H PRN Zierle-Ghosh, Asia B, DO       Or   acetaminophen (TYLENOL) suppository 650 mg  650 mg Rectal Q6H PRN Zierle-Ghosh, Asia B, DO       albuterol (PROVENTIL) (2.5 MG/3ML) 0.083% nebulizer solution 2.5 mg  2.5 mg Nebulization Q2H PRN Zierle-Ghosh, Asia B, DO       amLODipine (NORVASC) tablet 10 mg  10 mg Oral Daily Zierle-Ghosh, Asia B, DO   10 mg at 12/02/21 5093   aspirin EC tablet 81 mg  81 mg Oral Daily Zierle-Ghosh, Asia B, DO   81 mg at 12/02/21 0938   atorvastatin (LIPITOR) tablet 40 mg  40 mg Oral Daily Zierle-Ghosh, Asia B, DO   40 mg at 12/02/21 2671   calcitRIOL (ROCALTROL) capsule 0.25 mcg  0.25 mcg Oral Daily Zierle-Ghosh, Asia B, DO   0.25 mcg at 12/02/21 0941   doxazosin (CARDURA) tablet 4 mg  4 mg Oral BID Zierle-Ghosh, Asia B, DO   4 mg at 12/02/21 0940   ferrous gluconate (FERGON)  tablet 324 mg  324 mg Oral Q breakfast Zierle-Ghosh, Asia B, DO   324 mg at 12/02/21 1120   heparin injection 5,000 Units  5,000 Units Subcutaneous Q8H Zierle-Ghosh, Asia B, DO   5,000 Units at 12/02/21 0648   hydrALAZINE (APRESOLINE) tablet 25 mg  25 mg Oral BID Zierle-Ghosh, Asia B, DO   25 mg at 12/02/21 0939   ipratropium-albuterol (DUONEB) 0.5-2.5 (3) MG/3ML nebulizer solution 3 mL  3 mL Nebulization TID Antonieta Pert, MD  labetalol (NORMODYNE) tablet 300 mg  300 mg Oral BID Zierle-Ghosh, Asia B, DO   300 mg at 12/02/21 0939   methylPREDNISolone sodium succinate (SOLU-MEDROL) 40 mg/mL injection 40 mg  40 mg Intravenous Daily Kc, Ramesh, MD   40 mg at 12/02/21 1121   ondansetron (ZOFRAN) tablet 4 mg  4 mg Oral Q6H PRN Zierle-Ghosh, Asia B, DO       Or   ondansetron (ZOFRAN) injection 4 mg  4 mg Intravenous Q6H PRN Zierle-Ghosh, Asia B, DO       oseltamivir (TAMIFLU) capsule 30 mg  30 mg Oral Q M,W,F-HD Zierle-Ghosh, Asia B, DO   30 mg at 12/02/21 0446   oxyCODONE (Oxy IR/ROXICODONE) immediate release tablet 5 mg  5 mg Oral Q4H PRN Zierle-Ghosh, Asia B, DO       pantoprazole (PROTONIX) EC tablet 40 mg  40 mg Oral Daily Zierle-Ghosh, Asia B, DO   40 mg at 12/02/21 9702   Current Outpatient Medications  Medication Sig Dispense Refill   amLODipine (NORVASC) 10 MG tablet TAKE 1 TABLET BY MOUTH EVERY DAY 90 tablet 0   aspirin EC 81 MG EC tablet Take 1 tablet (81 mg total) by mouth daily. 30 tablet 1   atorvastatin (LIPITOR) 40 MG tablet TAKE 1 TABLET BY MOUTH DAILY AT 6 PM. 90 tablet 1   calcitRIOL (ROCALTROL) 0.5 MCG capsule Take 0.25 mcg by mouth daily.     doxazosin (CARDURA) 4 MG tablet TAKE 1 TABLET BY MOUTH TWICE A DAY 180 tablet 0   ferrous gluconate (FERGON) 240 (27 FE) MG tablet Take 240 mg by mouth daily.     furosemide (LASIX) 80 MG tablet Take 80 mg by mouth 2 (two) times daily.     hydrALAZINE (APRESOLINE) 25 MG tablet Take 1 tablet (25 mg total) by mouth 3 (three) times daily.  (Patient taking differently: Take 25 mg by mouth 2 (two) times daily.) 120 tablet 0   KLOR-CON M20 20 MEQ tablet Take 20 mEq by mouth Daily.      labetalol (NORMODYNE) 300 MG tablet TAKE 1 TABLET BY MOUTH TWICE A DAY 180 tablet 1   Multiple Vitamins-Minerals (MULTIVITAMIN WITH MINERALS) tablet Take 1 tablet by mouth daily.     omeprazole (PRILOSEC) 20 MG capsule Take 20 mg by mouth daily.     Labs: Basic Metabolic Panel: Recent Labs  Lab 12/01/21 1235  NA 139  K 4.2  CL 101  CO2 25  GLUCOSE 103*  BUN 61*  CREATININE 8.63*  CALCIUM 8.8*   Liver Function Tests: Recent Labs  Lab 12/01/21 1235  AST 39  ALT 26  ALKPHOS 42  BILITOT 0.9  PROT 6.4*  ALBUMIN 3.4*   CBC: Recent Labs  Lab 12/01/21 1235  WBC 9.4  NEUTROABS 7.6  HGB 9.0*  HCT 30.9*  MCV 92.8  PLT 228   Studies/Results: DG Chest 2 View  Result Date: 12/01/2021 CLINICAL DATA:  Shortness of breath EXAM: CHEST - 2 VIEW COMPARISON:  None. FINDINGS: The heart size and mediastinal contours are within normal limits. Both lungs are clear. Linear atelectasis along the right minor fissure. Elevation of the right hemidiaphragm. The visualized skeletal structures are unremarkable. IMPRESSION: No active cardiopulmonary disease. Electronically Signed   By: Keane Police D.O.   On: 12/01/2021 12:50    Dialysis Orders:  Brand new ESRD -> started 11/27/21, establishing orders MWF at [facility] 3:30hr, 350/600, EDW 88.5kg, 3K/2.25Ca, AVF, no heparin - Mircera 104mcg Iv q 4 weeks -  not yet given - Venofer 100mg  Iv x 10 doses - not yet given  Assessment/Plan:  Flu A, acute respiratory failure: On nasal O2 + tamiflu + nebulized albuterol + steroids.  ESRD: Missed HD on 12/2 in setting of acute illness, labs ok, he is new to dialysis. Our HD census is quite long today - not sure whether can squeeze him in for a treatment today. Will pick up with usual HD on Monday.  Hypertension/volume: BP ok - continue home meds.  CXR clear -  seems more that flu is the culprit than fluid. Still urinating quite a bit. Will give 1 dose Lasix today. If vol status changes/worsens, can dialyze tomorrow.  Anemia: Hgb 9 - will give dose of Aranesp here. Can give IV iron as outpatient after d/c.  Metabolic bone disease: Ca ok, Phos pending. Not on binders yet, will adjust calcitriol to MWF schedule.  Nutrition:  Alb slightly low, will add supplements.  Veneta Penton, PA-C 12/02/2021, 12:08 PM  [employer]

## 2021-12-02 NOTE — Progress Notes (Signed)
Patient seen and examined personally, I reviewed the chart, history and physical and admission note, done by admitting physician this morning and agree with the same with following addendum.  Please refer to the morning admission note for more detailed plan of care.  Briefly,   47 yom w/ ESRD on HD MWF, hypertension, hyperlipidemia, GERD, stroke, presents ED with a chief complaint of dyspnea and near syncope during dialysis.  He was having URI symptoms past 2 days but felt worse during dialysis and dialysis could not be done so sent to the ED. In the ED febrile up to 101.8, hypoxic needing 2 L nasal cannula no leukocytosis, tested positive for influenza troponin 75>67 no delta chest x-ray no acute finding EKG normal sinus rhythm Patient is admitted for further management  Seen this morning wheezing remains on oxygen feels somewhat better Continues to have cough On 3 to nasal cannula.   A/P Acute hypoxic respiratory failure due to influenza Influenza: Continue supplemental oxygen, Tamiflu, bronchodilators.  Having active wheezing start on Solu-Medrol 40 mg.  Chest x-ray was clear.  ESRD on HD MWF did not get dialysis yesterday nephrology alerted for dialysis Anemia of chronic kidney disease-monitor hemoglobin Metabolic bone disease-monitor calcium and Phos  Essential hypertension: Borderline controlLED. Continue Cardura, amlodipine, labetalol Elevated troponin no delta, flat suspect demand ischemia from hypoxia and influenza GERD continue PPI

## 2021-12-02 NOTE — ED Notes (Signed)
Hospitalist at bedside 

## 2021-12-02 NOTE — Plan of Care (Signed)

## 2021-12-02 NOTE — ED Provider Notes (Signed)
[facility] EMERGENCY DEPARTMENT Provider Note  CSN: [id number] Arrival date & time: 12/01/21 1202  Chief Complaint(s) Shortness of Breath  HPI Ryan Salazar is a 70 y.o. male with a past medical history listed below including ESRD on dialysis MWF who presents to the emergency department with 2 to 3 days of URI/influenza symptoms.  Patient noted intermittent shortness of breath which became worse today while heading to dialysis.  Patient was unable to get his dialysis due to being hypoxic on room air with sats in the 80s.  Patient was transferred here for evaluation.  He denied any associated chest pain or palpitations.  No nausea or vomiting.  No abdominal pain.  No other physical complaints.   Shortness of Breath  Past Medical History Past Medical History:  Diagnosis Date   Anemia    Arthritis    Blood transfusion 1989   Chronic kidney disease    Dyspnea    on exertion - no oxygen   GERD (gastroesophageal reflux disease)    HLD (hyperlipidemia)    Hypertension    Sleep apnea    Stroke ([place]) 07/2019   no residual   Ulcer 1989   BLEEDING STOMACH ULCER   Patient Active Problem List   Diagnosis Date Noted   Acute respiratory failure with hypoxia ([place]) 12/02/2021   Influenza A 12/02/2021   Protein calorie malnutrition ([place]) 12/02/2021   Elevated troponin 12/02/2021   ESRD (end stage renal disease) ([place]) 12/02/2021   Abnormal glucose 12/07/2019   Tobacco abuse 08/04/2019   Left arm weakness 08/04/2019   Ischemic stroke ([place]) 08/04/2019   Acute ischemic stroke ([place]) 07/21/2019   CKD (chronic kidney disease) stage 4, GFR 15-29 ml/min (HCC) 07/21/2019   Acute otitis media 06/11/2019   Excessive cerumen in right ear canal 06/11/2019   Right hand pain 06/03/2019   Essential hypertension 12/28/2018   Acute pain of left knee 12/28/2018   Stage 2 chronic kidney disease 12/28/2018   Nocturia 12/28/2018   Home Medication(s) Prior to Admission medications    Medication Sig Start Date End Date Taking? Authorizing Provider  amLODipine (NORVASC) 10 MG tablet TAKE 1 TABLET BY MOUTH EVERY DAY 11/10/21   [name], FNP  aspirin EC 81 MG EC tablet Take 1 tablet (81 mg total) by mouth daily. 07/24/19   Shelly Coss, MD  atorvastatin (LIPITOR) 40 MG tablet TAKE 1 TABLET BY MOUTH DAILY AT 6 PM. 06/21/21   [name], FNP  calcitRIOL (ROCALTROL) 0.5 MCG capsule Take 0.25 mcg by mouth daily.    [provider]  doxazosin (CARDURA) 4 MG tablet TAKE 1 TABLET BY MOUTH TWICE A DAY 08/14/21   [name], FNP  ferrous gluconate (FERGON) 240 (27 FE) MG tablet Take 240 mg by mouth daily.    [provider]  furosemide (LASIX) 80 MG tablet Take 80 mg by mouth 2 (two) times daily. 08/12/11   [provider]  hydrALAZINE (APRESOLINE) 25 MG tablet Take 1 tablet (25 mg total) by mouth 3 (three) times daily. Patient taking differently: Take 25 mg by mouth 2 (two) times daily. 07/07/20 07/07/21  [name], FNP  KLOR-CON M20 20 MEQ tablet Take 20 mEq by mouth Daily.  07/22/11   [provider]  labetalol (NORMODYNE) 300 MG tablet TAKE 1 TABLET BY MOUTH TWICE A DAY 08/21/21   [name], FNP  Multiple Vitamins-Minerals (MULTIVITAMIN WITH MINERALS) tablet Take 1 tablet by mouth daily.    [provider]  omeprazole (  PRILOSEC) 20 MG capsule Take 20 mg by mouth daily. 07/23/11   [provider]                                                                                                                                    Past Surgical History Past Surgical History:  Procedure Laterality Date   AV FISTULA PLACEMENT Right 05/02/2021   Procedure: RIGHT ARM ARTERIOVENOUS (AV) FISTULA CREATION;  Surgeon: Waynetta Sandy, MD;  Location: [facility];  Service: Vascular;  Laterality: Right;   COLONOSCOPY     INGUINAL HERNIA REPAIR  1973   TONSILLECTOMY  AS CHILD   WISDOM TOOTH EXTRACTION     Family History Family  History  Problem Relation Age of Onset   Stroke Mother    Healthy Father    Colon cancer Neg Hx    Colon polyps Neg Hx     Social History Social History   Tobacco Use   Smoking status: Some Days    Packs/day: 0.25    Years: 50.00    Pack years: 12.50    Types: Cigarettes   Smokeless tobacco: Never   Tobacco comments:    3 cigaretts per day  Vaping Use   Vaping Use: Never used  Substance Use Topics   Alcohol use: Yes    Comment: occasional   Drug use: Yes    Types: Marijuana    Comment:  last use was on 04/29/21   Allergies Patient has no known allergies.  Review of Systems Review of Systems  Respiratory:  Positive for shortness of breath.   All other systems are reviewed and are negative for acute change except as noted in the HPI  Physical Exam Vital Signs  I have reviewed the triage vital signs BP (!) 152/91   Pulse 79   Temp 99.2 F (37.3 C) (Oral)   Resp 16   Ht 5\' 11"  (1.803 m)   Wt 88.5 kg   SpO2 97%   BMI 27.20 kg/m   Physical Exam Vitals reviewed.  Constitutional:      General: He is not in acute distress.    Appearance: He is well-developed. He is not diaphoretic.  HENT:     Head: Normocephalic and atraumatic.     Nose: Nose normal.  Eyes:     General: No scleral icterus.       Right eye: No discharge.        Left eye: No discharge.     Conjunctiva/sclera: Conjunctivae normal.     Pupils: Pupils are equal, round, and reactive to light.  Cardiovascular:     Rate and Rhythm: Normal rate and regular rhythm.     Heart sounds: No murmur heard.   No friction rub. No gallop.  Pulmonary:     Effort: Tachypnea and respiratory distress present.     Breath sounds: Normal breath sounds. Decreased air movement present. No stridor. No  rales.  Abdominal:     General: There is no distension.     Palpations: Abdomen is soft.     Tenderness: There is no abdominal tenderness.  Musculoskeletal:        General: No tenderness.     Cervical back: Normal  range of motion and neck supple.  Skin:    General: Skin is warm and dry.     Findings: No erythema or rash.  Neurological:     Mental Status: He is alert and oriented to person, place, and time.    ED Results and Treatments Labs (all labs ordered are listed, but only abnormal results are displayed) Labs Reviewed  RESP PANEL BY RT-PCR (FLU A&B, COVID) ARPGX2 - Abnormal; Notable for the following components:      Result Value   Influenza A by PCR POSITIVE (*)    All other components within normal limits  COMPREHENSIVE METABOLIC PANEL - Abnormal; Notable for the following components:   Glucose, Bld 103 (*)    BUN 61 (*)    Creatinine, Ser 8.63 (*)    Calcium 8.8 (*)    Total Protein 6.4 (*)    Albumin 3.4 (*)    GFR, Estimated 6 (*)    All other components within normal limits  CBC WITH DIFFERENTIAL/PLATELET - Abnormal; Notable for the following components:   RBC 3.33 (*)    Hemoglobin 9.0 (*)    HCT 30.9 (*)    MCHC 29.1 (*)    Lymphs Abs 0.5 (*)    Monocytes Absolute 1.2 (*)    All other components within normal limits  TROPONIN I (HIGH SENSITIVITY) - Abnormal; Notable for the following components:   Troponin I (High Sensitivity) 75 (*)    All other components within normal limits  TROPONIN I (HIGH SENSITIVITY) - Abnormal; Notable for the following components:   Troponin I (High Sensitivity) 67 (*)    All other components within normal limits  HIV ANTIBODY (ROUTINE TESTING W REFLEX)                                                                                                                         EKG  EKG Interpretation  Date/Time:  Friday December 01 2021 11:56:21 EST Ventricular Rate:  87 PR Interval:  200 QRS Duration: 108 QT Interval:  378 QTC Calculation: 650 R Axis:   14 Text Interpretation: Sinus rhythm with occasional Premature ventricular complexes Otherwise normal ECG Confirmed by Addison Lank [phone]) on 12/01/2021 11:21:57 PM       Radiology DG  Chest 2 View  Result Date: 12/01/2021 CLINICAL DATA:  Shortness of breath EXAM: CHEST - 2 VIEW COMPARISON:  None. FINDINGS: The heart size and mediastinal contours are within normal limits. Both lungs are clear. Linear atelectasis along the right minor fissure. Elevation of the right hemidiaphragm. The visualized skeletal structures are unremarkable. IMPRESSION: No active cardiopulmonary disease. Electronically Signed   By: Keane Police D.O.   On: 12/01/2021  12:50    Pertinent labs & imaging results that were available during my care of the patient were reviewed by me and considered in my medical decision making (see MDM for details).  Medications Ordered in ED Medications  oseltamivir (TAMIFLU) capsule 30 mg (30 mg Oral Given 12/02/21 0446)  aspirin EC tablet 81 mg (has no administration in time range)  amLODipine (NORVASC) tablet 10 mg (has no administration in time range)  atorvastatin (LIPITOR) tablet 40 mg (has no administration in time range)  doxazosin (CARDURA) tablet 4 mg (has no administration in time range)  hydrALAZINE (APRESOLINE) tablet 25 mg (has no administration in time range)  labetalol (NORMODYNE) tablet 300 mg (has no administration in time range)  calcitRIOL (ROCALTROL) capsule 0.25 mcg (has no administration in time range)  pantoprazole (PROTONIX) EC tablet 40 mg (has no administration in time range)  ferrous gluconate (FERGON) tablet 324 mg (has no administration in time range)  heparin injection 5,000 Units (5,000 Units Subcutaneous Given 12/02/21 0648)  acetaminophen (TYLENOL) tablet 650 mg (has no administration in time range)    Or  acetaminophen (TYLENOL) suppository 650 mg (has no administration in time range)  oxyCODONE (Oxy IR/ROXICODONE) immediate release tablet 5 mg (has no administration in time range)  ondansetron (ZOFRAN) tablet 4 mg (has no administration in time range)    Or  ondansetron (ZOFRAN) injection 4 mg (has no administration in time range)   albuterol (PROVENTIL) (2.5 MG/3ML) 0.083% nebulizer solution 2.5 mg (has no administration in time range)  ipratropium-albuterol (DUONEB) 0.5-2.5 (3) MG/3ML nebulizer solution 3 mL (has no administration in time range)  ipratropium-albuterol (DUONEB) 0.5-2.5 (3) MG/3ML nebulizer solution 3 mL (3 mLs Nebulization Given 12/02/21 0009)  ipratropium-albuterol (DUONEB) 0.5-2.5 (3) MG/3ML nebulizer solution 3 mL (3 mLs Nebulization Given 12/02/21 0122)  acetaminophen (TYLENOL) tablet 650 mg (650 mg Oral Given 12/02/21 0120)  doxazosin (CARDURA) tablet 4 mg (4 mg Oral Given 12/02/21 0149)  labetalol (NORMODYNE) tablet 300 mg (300 mg Oral Given 12/02/21 0120)                                                                                                                                     Procedures .1-3 Lead EKG Interpretation Performed by: Fatima Blank, MD Authorized by: Fatima Blank, MD     Interpretation: normal     ECG rate:  88   ECG rate assessment: normal     Rhythm: sinus rhythm     Ectopy: none     Conduction: normal   .Critical Care Performed by: Fatima Blank, MD Authorized by: Fatima Blank, MD   Critical care provider statement:    Critical care time (minutes):  30   Critical care was necessary to treat or prevent imminent or life-threatening deterioration of the following conditions:  Respiratory failure   Critical care was time spent personally by me on the following activities:  Development  of treatment plan with patient or surrogate, discussions with consultants, evaluation of patient's response to treatment, examination of patient, ordering and review of laboratory studies, ordering and review of radiographic studies, ordering and performing treatments and interventions, pulse oximetry, re-evaluation of patient's condition and review of old charts   Care discussed with: admitting provider    (including critical care time)  Medical Decision  Making / ED Course I have reviewed the nursing notes for this encounter and the patient's prior records (if available in EHR or on provided paperwork).  Ryan Salazar was evaluated in Emergency Department on 12/02/2021 for the symptoms described in the history of present illness. He was evaluated in the context of the global COVID-19 pandemic, which necessitated consideration that the patient might be at risk for infection with the SARS-CoV-2 virus that causes COVID-19. Institutional protocols and algorithms that pertain to the evaluation of patients at risk for COVID-19 are in a state of rapid change based on information released by regulatory bodies including the CDC and federal and state organizations. These policies and algorithms were followed during the patient's care in the ED.     Patient work-up was positive for influenza A.  He had decreased lung sounds which improved with breathing treatment.  Likely reactive airway disease. Chest x-ray without evidence of pneumonia. After 2 rounds of DuoNeb's, patient still required 2 L nasal cannula which is improved from prior. Feel patient requires additional treatment. Admitted to medicine for further management.  Pertinent labs & imaging results that were available during my care of the patient were reviewed by me and considered in my medical decision making:    Final Clinical Impression(s) / ED Diagnoses Final diagnoses:  Influenza A  Acute hypoxemic respiratory failure (HCC)  Moderate persistent reactive airway disease without complication     This chart was dictated using voice recognition software.  Despite best efforts to proofread,  errors can occur which can change the documentation meaning.    Fatima Blank, MD 12/02/21 [phone]

## 2021-12-03 LAB — COMPREHENSIVE METABOLIC PANEL
ALT: 35 U/L (ref 0–44)
AST: 53 U/L — ABNORMAL HIGH (ref 15–41)
Albumin: 2.7 g/dL — ABNORMAL LOW (ref 3.5–5.0)
Alkaline Phosphatase: 35 U/L — ABNORMAL LOW (ref 38–126)
Anion gap: 12 (ref 5–15)
BUN: 91 mg/dL — ABNORMAL HIGH (ref 8–23)
CO2: 25 mmol/L (ref 22–32)
Calcium: 8.3 mg/dL — ABNORMAL LOW (ref 8.9–10.3)
Chloride: 99 mmol/L (ref 98–111)
Creatinine, Ser: 9.93 mg/dL — ABNORMAL HIGH (ref 0.61–1.24)
GFR, Estimated: 5 mL/min — ABNORMAL LOW (ref >60)
Glucose, Bld: 98 mg/dL (ref 70–99)
Potassium: 3.8 mmol/L (ref 3.5–5.1)
Sodium: 136 mmol/L (ref 135–145)
Total Bilirubin: 0.7 mg/dL (ref 0.3–1.2)
Total Protein: 5.5 g/dL — ABNORMAL LOW (ref 6.5–8.1)

## 2021-12-03 LAB — CBC WITH DIFFERENTIAL/PLATELET
Abs Immature Granulocytes: 0.03 10*3/uL (ref 0.00–0.07)
Basophils Absolute: 0 10*3/uL (ref 0.0–0.1)
Basophils Relative: 0 %
Eosinophils Absolute: 0.2 10*3/uL (ref 0.0–0.5)
Eosinophils Relative: 2 %
HCT: 28.3 % — ABNORMAL LOW (ref 39.0–52.0)
Hemoglobin: 8.2 g/dL — ABNORMAL LOW (ref 13.0–17.0)
Immature Granulocytes: 0 %
Lymphocytes Relative: 9 %
Lymphs Abs: 0.7 10*3/uL (ref 0.7–4.0)
MCH: 26.6 pg (ref 26.0–34.0)
MCHC: 29 g/dL — ABNORMAL LOW (ref 30.0–36.0)
MCV: 91.9 fL (ref 80.0–100.0)
Monocytes Absolute: 1.1 10*3/uL — ABNORMAL HIGH (ref 0.1–1.0)
Monocytes Relative: 14 %
Neutro Abs: 5.7 10*3/uL (ref 1.7–7.7)
Neutrophils Relative %: 75 %
Platelets: 206 10*3/uL (ref 150–400)
RBC: 3.08 MIL/uL — ABNORMAL LOW (ref 4.22–5.81)
RDW: 14.1 % (ref 11.5–15.5)
WBC: 7.6 10*3/uL (ref 4.0–10.5)
nRBC: 0 % (ref 0.0–0.2)

## 2021-12-03 LAB — EXPECTORATED SPUTUM ASSESSMENT W GRAM STAIN, RFLX TO RESP C

## 2021-12-03 LAB — PHOSPHORUS: Phosphorus: 6 mg/dL — ABNORMAL HIGH (ref 2.5–4.6)

## 2021-12-03 LAB — MAGNESIUM: Magnesium: 1.6 mg/dL — ABNORMAL LOW (ref 1.7–2.4)

## 2021-12-03 LAB — HEPATITIS B SURFACE ANTIBODY,QUALITATIVE: Hep B S Ab: NONREACTIVE

## 2021-12-03 LAB — HEPATITIS B SURFACE ANTIGEN: Hepatitis B Surface Ag: NONREACTIVE

## 2021-12-03 MED ORDER — MAGNESIUM OXIDE -MG SUPPLEMENT 400 (240 MG) MG PO TABS
400.0000 mg | ORAL_TABLET | Freq: Two times a day (BID) | ORAL | Status: DC
  Administered 2021-12-03 – 2021-12-04 (×3): 400 mg via ORAL
  Filled 2021-12-03 (×3): qty 1

## 2021-12-03 MED ORDER — IPRATROPIUM-ALBUTEROL 0.5-2.5 (3) MG/3ML IN SOLN
3.0000 mL | Freq: Three times a day (TID) | RESPIRATORY_TRACT | Status: DC
  Administered 2021-12-03 – 2021-12-04 (×2): 3 mL via RESPIRATORY_TRACT
  Filled 2021-12-03 (×2): qty 3

## 2021-12-03 MED ORDER — CHLORHEXIDINE GLUCONATE CLOTH 2 % EX PADS
6.0000 | MEDICATED_PAD | Freq: Every day | CUTANEOUS | Status: DC
  Administered 2021-12-03 – 2021-12-04 (×2): 6 via TOPICAL

## 2021-12-03 NOTE — Progress Notes (Signed)
Ryan Salazar Progress Note  Subjective: seen in room, getting nebs Rx, on nasal O2, mild SOB  Vitals:   12/03/21 0500 12/03/21 0813 12/03/21 0821 12/03/21 1039  BP: (!) 152/86 132/79  (!) 152/93  Pulse: 84 90    Resp: 18 20    Temp: 98.7 F (37.1 C) 98.2 F (36.8 C)  97.8 F (36.6 C)  TempSrc: Oral Oral  Temporal  SpO2: 97% 96% 93%   Weight:      Height:        Exam:  alert, nad , nasal o2  no jvd  Chest cta bilat  Cor reg no RG  Abd soft ntnd no ascites   Ext no LE edema   Alert, NF, ox3   RUE AVF+bruit        OP HD: MWF G-O  - new ESRD started 11/27/21, establishing orders >   3.5h  350/600  88.5kg  3K/2.25 bath  RUE AVF  Hep none - Mircera 82mcg Iv q 4 weeks - not yet given - Venofer 100mg  Iv x 10 doses - not yet given   Assessment/Plan:  Flu A, acute respiratory failure: On nasal O2 + tamiflu + nebulized albuterol + steroids.  ESRD: Missed HD on 12/2 in setting of acute illness. He is new to dialysis. Plan HD today off schedule, then resume HD on schedule tomorrow again.   Hypertension/volume: BP ok - continue home meds.  CXR clear. Under dry wt, but new to dialysis and requiring O2. Try 2 L UF today.   Anemia: Hgb 9 - will give dose of Aranesp here. Can give IV iron as outpatient after d/c.  Metabolic bone disease: Ca ok, Phos pending. Not on binders yet, will adjust calcitriol to MWF schedule.  Nutrition:  Alb slightly low, will add supplements.     Rob Shishir Krantz 12/03/2021, 11:39 AM   Recent Labs  Lab 12/01/21 1235 12/03/21 0535  K 4.2 3.8  BUN 61* 91*  CREATININE 8.63* 9.93*  CALCIUM 8.8* 8.3*  PHOS  --  6.0*  HGB 9.0* 8.2*   Inpatient medications:  (feeding supplement) PROSource Plus  30 mL Oral BID BM   amLODipine  10 mg Oral Daily   aspirin EC  81 mg Oral Daily   atorvastatin  40 mg Oral Daily   [START ON 12/04/2021] calcitRIOL  0.25 mcg Oral Once per day on Mon Wed Fri   Chlorhexidine Gluconate Cloth  6 each Topical Q0600    doxazosin  4 mg Oral BID   ferrous gluconate  324 mg Oral Q breakfast   heparin  5,000 Units Subcutaneous Q8H   hydrALAZINE  25 mg Oral BID   ipratropium-albuterol  3 mL Nebulization Q6H   labetalol  300 mg Oral BID   magnesium oxide  400 mg Oral BID   methylPREDNISolone (SOLU-MEDROL) injection  40 mg Intravenous Daily   oseltamivir  30 mg Oral Q M,W,F-HD   pantoprazole  40 mg Oral Daily    acetaminophen **OR** acetaminophen, albuterol, ondansetron **OR** ondansetron (ZOFRAN) IV, oxyCODONE

## 2021-12-03 NOTE — Progress Notes (Signed)
RT gave pt a specimen cup and instructed pt that if/when he coughs up anything to use cup and notify RT or RN when and we can send to lab.

## 2021-12-03 NOTE — Progress Notes (Signed)
PROGRESS NOTE    Ryan Salazar  FGH:[id number] DOB: [birth year] DOA: 12/01/2021 PCP: [name], FNP   Chief Complaint  Patient presents with   Shortness of Breath  Brief Narrative/Hospital Course: Ryan Salazar, 70 y.o. male with PMH of  ESRD on HD MWF, hypertension, hyperlipidemia, GERD, stroke, presents ED with a chief complaint of dyspnea and near syncope during dialysis.  He was having URI symptoms past 2 days but felt worse during dialysis and dialysis could not be done so sent to the ED. In the ED febrile up to 101.8, hypoxic needing 2 L nasal cannula no leukocytosis, tested positive for influenza troponin 75>67 no delta chest x-ray no acute finding EKG normal sinus rhythm Patient was admitted for further management   Subjective: Seen this morning.  Feels overall better but not back to baseline yet Has some wheezing on expiration On 2 L nasal cannula saturating 93 to 97%, afebrile overnight WBC count stable potassium stable  Assessment & Plan: Acute hypoxic respiratory failure due to influenza Influenza A infection w/ URTI: Chest x-ray clear on admission, hemodynamically stable, will continue on Tamiflu, IV steroids bronchodilators supplemental oxygen.  Encourage PT OT today, wean off oxygen as tolerated   ESRD on HD MWF did not get dialysis on F.  Nephro on board will decide on next dialysis due to long census over the weekend unable to dialyze Anemia of CKD monitor hemoglobin Recent Labs  Lab 12/01/21 1235 12/03/21 0535  HGB 9.0* 8.2*  HCT 30.9* 67.8*    Metabolic bone disease-monitor calcium and Phos Hypomagnesemia add oral Mag-Ox  Essential hypertension: Well-controlled this morning.  Continue home Cardura, amlodipine, labetalol Elevated troponin no delta, flat suspect demand ischemia from hypoxia and influenza A infection acute illness.  Continues aspirin statin GERD continue PPI  DVT prophylaxis: heparin injection 5,000 Units Start: 12/02/21 0630 SCDs Start:  12/02/21 9381 Code Status:   Code Status: DNR Family Communication: plan of care discussed with patient at bedside. Status is: Inpatient Remains inpatient appropriate because: For ongoing hypoxic respiratory failure shortness of breath Disposition: Currently NOT medically stable for discharge. Anticipated Disposition: home.  PT OT eval requested today   Objective: Vitals last 24 hrs: Vitals:   12/03/21 0407 12/03/21 0500 12/03/21 0813 12/03/21 0821  BP:  (!) 152/86 132/79   Pulse:  84 90   Resp:  18 20   Temp:  98.7 F (37.1 C) 98.2 F (36.8 C)   TempSrc:  Oral Oral   SpO2:  97% 96% 93%  Weight: 86.3 kg     Height:       Weight change: -1.089 kg  Intake/Output Summary (Last 24 hours) at 12/03/2021 1007 Last data filed at 12/03/2021 0900 Gross per 24 hour  Intake 480 ml  Output 1250 ml  Net -770 ml   Net IO Since Admission: -1,170 mL [12/03/21 1007]   Physical Examination: General exam: AA0x3, weak,older than stated age. HEENT:Oral mucosa moist, Ear/Nose WNL grossly,dentition normal. Respiratory system: B/l diminished with expiratory wheezing BS, no use of accessory muscle, non tender. Cardiovascular system: S1 & S2 +,no JVD. Gastrointestinal system: Abdomen soft, NT,ND, BS+. Nervous System:Alert, awake, moving extremities. Extremities: edema none, distal peripheral pulses palpable.  Skin: No rashes, no icterus. MSK: Normal muscle bulk, tone, power.  Medications reviewed:  Scheduled Meds:  (feeding supplement) PROSource Plus  30 mL Oral BID BM   amLODipine  10 mg Oral Daily   aspirin EC  81 mg Oral Daily   atorvastatin  40 mg Oral Daily   [START ON 12/04/2021] calcitRIOL  0.25 mcg Oral Once per day on Mon Wed Fri   Chlorhexidine Gluconate Cloth  6 each Topical Q0600   doxazosin  4 mg Oral BID   ferrous gluconate  324 mg Oral Q breakfast   heparin  5,000 Units Subcutaneous Q8H   hydrALAZINE  25 mg Oral BID   ipratropium-albuterol  3 mL Nebulization Q6H    labetalol  300 mg Oral BID   methylPREDNISolone (SOLU-MEDROL) injection  40 mg Intravenous Daily   oseltamivir  30 mg Oral Q M,W,F-HD   pantoprazole  40 mg Oral Daily   Continuous Infusions:  Diet Order             Diet heart healthy/carb modified Room service appropriate? Yes; Fluid consistency: Thin  Diet effective now                   Weight change: -1.089 kg  Wt Readings from Last 3 Encounters:  12/03/21 86.3 kg  08/09/21 91.6 kg  06/09/21 97.2 kg     Consultants:see note  Procedures:see note Antimicrobials: Anti-infectives (From admission, onward)    Start     Dose/Rate Route Frequency Ordered Stop   12/02/21 0500  oseltamivir (TAMIFLU) capsule 30 mg       Note to Pharmacy: Tamiflu 30 mg after HD x 5 days for ESRD on HD   30 mg Oral Every M-W-F (Hemodialysis) 12/02/21 0421 12/08/21 1159      Culture/Microbiology    Component Value Date/Time   SDES SPUTUM 12/03/2021 0440   SDES SPUTUM 12/03/2021 0440   SPECREQUEST NONE 12/03/2021 0440   SPECREQUEST NONE Reflexed from [id number] 12/03/2021 0440   CULT PENDING 12/03/2021 0440   REPTSTATUS PENDING 12/03/2021 0440   REPTSTATUS PENDING 12/03/2021 0440    Other culture-see note  Unresulted Labs (From admission, onward)     Start     Ordered   12/03/21 0930  Hepatitis B surface antigen  (New Admission [facility] Labs (Hepatitis B))  Once,   R        12/03/21 0929   12/03/21 0930  Hepatitis B surface antibody  (New Admission [facility] Labs (Hepatitis B))  Once,   R        12/03/21 0929   12/03/21 0930  Hepatitis B surface antibody,quantitative  (New Admission [facility] Labs (Hepatitis B))  Once,   R        12/03/21 3614           Data Reviewed: I have personally reviewed following labs and imaging studies CBC: Recent Labs  Lab 12/01/21 1235 12/03/21 0535  WBC 9.4 7.6  NEUTROABS 7.6 5.7  HGB 9.0* 8.2*  HCT 30.9* 28.3*  MCV 92.8 91.9  PLT 228 431   Basic Metabolic Panel: Recent Labs  Lab 12/01/21 1235  12/03/21 0535  NA 139 136  K 4.2 3.8  CL 101 99  CO2 25 25  GLUCOSE 103* 98  BUN 61* 91*  CREATININE 8.63* 9.93*  CALCIUM 8.8* 8.3*  MG  --  1.6*  PHOS  --  6.0*   GFR: Estimated Creatinine Clearance: 7.5 mL/min (A) (by C-G formula based on SCr of 9.93 mg/dL (H)). Liver Function Tests: Recent Labs  Lab 12/01/21 1235 12/03/21 0535  AST 39 53*  ALT 26 35  ALKPHOS 42 35*  BILITOT 0.9 0.7  PROT 6.4* 5.5*  ALBUMIN 3.4* 2.7*   No results for input(s): LIPASE, AMYLASE in the last  168 hours. No results for input(s): AMMONIA in the last 168 hours. Coagulation Profile: No results for input(s): INR, PROTIME in the last 168 hours. Cardiac Enzymes: No results for input(s): CKTOTAL, CKMB, CKMBINDEX, TROPONINI in the last 168 hours. BNP (last 3 results) No results for input(s): PROBNP in the last 8760 hours. HbA1C: No results for input(s): HGBA1C in the last 72 hours. CBG: No results for input(s): GLUCAP in the last 168 hours. Lipid Profile: No results for input(s): CHOL, HDL, LDLCALC, TRIG, CHOLHDL, LDLDIRECT in the last 72 hours. Thyroid Function Tests: No results for input(s): TSH, T4TOTAL, FREET4, T3FREE, THYROIDAB in the last 72 hours. Anemia Panel: No results for input(s): VITAMINB12, FOLATE, FERRITIN, TIBC, IRON, RETICCTPCT in the last 72 hours. Sepsis Labs: No results for input(s): PROCALCITON, LATICACIDVEN in the last 168 hours.  Recent Results (from the past 240 hour(s))  Resp Panel by RT-PCR (Flu A&B, Covid) Nasopharyngeal Swab     Status: Abnormal   Collection Time: 12/01/21 11:35 PM   Specimen: Nasopharyngeal Swab; Nasopharyngeal(NP) swabs in vial transport medium  Result Value Ref Range Status   SARS Coronavirus 2 by RT PCR NEGATIVE NEGATIVE Final    Comment: (NOTE) SARS-CoV-2 target nucleic acids are NOT DETECTED.  The SARS-CoV-2 RNA is generally detectable in upper respiratory specimens during the acute phase of infection. The lowest concentration of  SARS-CoV-2 viral copies this assay can detect is 138 copies/mL. A negative result does not preclude SARS-Cov-2 infection and should not be used as the sole basis for treatment or other patient management decisions. A negative result may occur with  improper specimen collection/handling, submission of specimen other than nasopharyngeal swab, presence of viral mutation(s) within the areas targeted by this assay, and inadequate number of viral copies(<138 copies/mL). A negative result must be combined with clinical observations, patient history, and epidemiological information. The expected result is Negative.  Fact Sheet for Patients:  EntrepreneurPulse.com.au  Fact Sheet for Healthcare Providers:  IncredibleEmployment.be  This test is no t yet approved or cleared by the [place] FDA and  has been authorized for detection and/or diagnosis of SARS-CoV-2 by FDA under an Emergency Use Authorization (EUA). This EUA will remain  in effect (meaning this test can be used) for the duration of the COVID-19 declaration under Section 564(b)(1) of the Act, 21 U.S.C.section 360bbb-3(b)(1), unless the authorization is terminated  or revoked sooner.       Influenza A by PCR POSITIVE (A) NEGATIVE Final   Influenza B by PCR NEGATIVE NEGATIVE Final    Comment: (NOTE) The Xpert Xpress SARS-CoV-2/FLU/RSV plus assay is intended as an aid in the diagnosis of influenza from Nasopharyngeal swab specimens and should not be used as a sole basis for treatment. Nasal washings and aspirates are unacceptable for Xpert Xpress SARS-CoV-2/FLU/RSV testing.  Fact Sheet for Patients: EntrepreneurPulse.com.au  Fact Sheet for Healthcare Providers: IncredibleEmployment.be  This test is not yet approved or cleared by the [place] FDA and has been authorized for detection and/or diagnosis of SARS-CoV-2 by FDA under an Emergency Use  Authorization (EUA). This EUA will remain in effect (meaning this test can be used) for the duration of the COVID-19 declaration under Section 564(b)(1) of the Act, 21 U.S.C. section 360bbb-3(b)(1), unless the authorization is terminated or revoked.  Performed at [facility], [place] [address] [place], [place] [address]   MRSA Next Gen by PCR, Nasal     Status: None   Collection Time: 12/02/21  2:16 PM   Specimen: Nasal  Mucosa; Nasal Swab  Result Value Ref Range Status   MRSA by PCR Next Gen NOT DETECTED NOT DETECTED Final    Comment: (NOTE) The GeneXpert MRSA Assay (FDA approved for NASAL specimens only), is one component of a comprehensive MRSA colonization surveillance program. It is not intended to diagnose MRSA infection nor to guide or monitor treatment for MRSA infections. Test performance is not FDA approved in patients less than 73 years old. Performed at [facility], [place] [address], [place], [place] [address]   Expectorated Sputum Assessment w Gram Stain, Rflx to Resp Cult     Status: None (Preliminary result)   Collection Time: 12/03/21  4:40 AM   Specimen: Sputum  Result Value Ref Range Status   Specimen Description SPUTUM  Final   Special Requests NONE  Final   Sputum evaluation   Final    THIS SPECIMEN IS ACCEPTABLE FOR SPUTUM CULTURE Performed at [facility], [place] [address] [place], [place] [address]    Report Status PENDING  Incomplete  Culture, Respiratory w Gram Stain     Status: None (Preliminary result)   Collection Time: 12/03/21  4:40 AM   Specimen: SPU  Result Value Ref Range Status   Specimen Description SPUTUM  Final   Special Requests NONE Reflexed from [id number]  Final   Gram Stain   Final    FEW SQUAMOUS EPITHELIAL CELLS PRESENT MODERATE WBC SEEN MODERATE GRAM POSITIVE RODS MODERATE GRAM NEGATIVE RODS ABUNDANT GRAM POSITIVE COCCI Performed at [facility], [address] [place], [place] [address]    Culture PENDING   Incomplete   Report Status PENDING  Incomplete     Radiology Studies: DG Chest 2 View  Result Date: 12/01/2021 CLINICAL DATA:  Shortness of breath EXAM: CHEST - 2 VIEW COMPARISON:  None. FINDINGS: The heart size and mediastinal contours are within normal limits. Both lungs are clear. Linear atelectasis along the right minor fissure. Elevation of the right hemidiaphragm. The visualized skeletal structures are unremarkable. IMPRESSION: No active cardiopulmonary disease. Electronically Signed   By: Keane Police D.O.   On: 12/01/2021 12:50     LOS: 1 day   Antonieta Pert, MD [facility]  12/03/2021, 10:07 AM

## 2021-12-03 NOTE — Progress Notes (Signed)
PT Cancellation Note  Patient Details Name: Ryan Salazar MRN: [id number] DOB: 22-Nov-1951   Cancelled Treatment:    Reason Eval/Treat Not Completed: Patient at procedure or test/unavailable at HD- will attempt to return later if time/schedule allow   Ann Lions PT, DPT, PN2   Supplemental Physical Therapist [place]    Pager [phone] Acute Rehab Office [phone]

## 2021-12-04 ENCOUNTER — Inpatient Hospital Stay (HOSPITAL_COMMUNITY)
Admission: RE | Admit: 2021-12-04 | Discharge: 2021-12-04 | Disposition: A | Discharge status: Home / Self Care | Payer: BC Managed Care – PPO | Source: Ambulatory Visit | Attending: Internal Medicine | Admitting: Internal Medicine

## 2021-12-04 ENCOUNTER — Other Ambulatory Visit (HOSPITAL_COMMUNITY): Payer: Self-pay

## 2021-12-04 ENCOUNTER — Encounter (HOSPITAL_COMMUNITY): Payer: Self-pay

## 2021-12-04 LAB — BASIC METABOLIC PANEL
Anion gap: 11 (ref 5–15)
BUN: 66 mg/dL — ABNORMAL HIGH (ref 8–23)
CO2: 25 mmol/L (ref 22–32)
Calcium: 8 mg/dL — ABNORMAL LOW (ref 8.9–10.3)
Chloride: 99 mmol/L (ref 98–111)
Creatinine, Ser: 7.52 mg/dL — ABNORMAL HIGH (ref 0.61–1.24)
GFR, Estimated: 7 mL/min — ABNORMAL LOW (ref >60)
Glucose, Bld: 148 mg/dL — ABNORMAL HIGH (ref 70–99)
Potassium: 4.2 mmol/L (ref 3.5–5.1)
Sodium: 135 mmol/L (ref 135–145)

## 2021-12-04 LAB — HEPATITIS B SURFACE ANTIBODY, QUANTITATIVE: Hep B S AB Quant (Post): <3.1 m[IU]/mL — ABNORMAL LOW (ref >9.9)

## 2021-12-04 MED ORDER — IPRATROPIUM-ALBUTEROL 0.5-2.5 (3) MG/3ML IN SOLN: 3.0000 mL | Freq: Four times a day (QID) | RESPIRATORY_TRACT | Status: DC | PRN

## 2021-12-04 MED ORDER — OSELTAMIVIR PHOSPHATE 30 MG PO CAPS
30.0000 mg | ORAL_CAPSULE | ORAL | 0 refills | Status: AC
  Filled 2021-12-04: qty 1, 1d supply, fill #0

## 2021-12-04 MED ORDER — HYDRALAZINE HCL 25 MG PO TABS: 25.0000 mg | ORAL_TABLET | Freq: Two times a day (BID) | ORAL | 0 refills | Status: DC

## 2021-12-04 MED ORDER — PREDNISONE 10 MG PO TABS
20.0000 mg | ORAL_TABLET | Freq: Every day | ORAL | 0 refills | Status: AC
  Filled 2021-12-04: qty 10, 5d supply, fill #0

## 2021-12-04 NOTE — Discharge Summary (Signed)
Physician Discharge Summary  Ryan Salazar KXF:[id number] DOB: Jul 06, 1951 DOA: 12/01/2021  PCP: [name], FNP  Admit date: 12/01/2021 Discharge date: 12/04/2021  Admitted From: home Disposition:  home  Recommendations for Outpatient Follow-up:  Follow up with PCP in 1-2 weeks Please obtain BMP/CBC in one week   Home Health:no  Equipment/Devices: none  Discharge Condition: Stable Code Status:   Code Status: DNR Diet recommendation:  Diet Order             Diet heart healthy/carb modified Room service appropriate? Yes; Fluid consistency: Thin  Diet effective now                    Brief/Interim Summary:  70 y.o. male with PMH of  ESRD on HD MWF, hypertension, hyperlipidemia, GERD, stroke, presents ED with a chief complaint of dyspnea and near syncope during dialysis.  He was having URI symptoms past 2 days but felt worse during dialysis and dialysis could not be done so sent to the ED. In the ED febrile up to 101.8, hypoxic needing 2 L nasal cannula no leukocytosis, tested positive for influenza troponin 75>67 no delta chest x-ray no acute finding EKG normal sinus rhythm Patient was admitted for further management of acute hypoxic respiratory failure due to influenza in the setting of ESRD and missing dialysis. At this time clinically improved, hypoxia resolved, will get dialysis today and will be discharged home, worked with PT OT and did well.  Discharge Diagnoses:   Acute hypoxic respiratory failure due to influenza Influenza A infection w/ URTI: Chest x-ray clear on admission, hemodynamically stable, at this time hypoxia resolved, wheezing much improved with IV steroid, weaned off oxygen, will discharge on Tamiflu and oral steroid and albuterol inhaler.  Cleared by PT OT.   ESRD on HD MWF did not get dialysis on Friday, underwent HD here and will get dialysis today prior to discharge home.  Okay for discharge per nephrology. Anemia of CKD monitor hemoglobin. 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  Continues aspirin statin GERD continue PPI Consults: Nephrology  Subjective: Alert awake oriented x3, came off oxygen this morning doing well.  Okay for discharge home Discharge Exam: Vitals:   12/04/21 0815 12/04/21 0945  BP:  (!) 155/83  Pulse:    Resp:    Temp:    SpO2: 96%    General: Pt is alert, awake, not in acute distress Cardiovascular: RRR, S1/S2 +, no rubs, no gallops Respiratory: CTA bilaterally, no wheezing, no rhonchi Abdominal: Soft, NT, ND, bowel sounds + Extremities: no edema, no cyanosis  Discharge Instructions  Discharge Instructions     Discharge instructions   Complete by: As directed    Check CBC BMP in 1 week. Please call call MD or return to ER for similar or worsening recurring problem that brought you to hospital or if any fever,nausea/vomiting,abdominal pain, uncontrolled pain, chest pain,  shortness of breath or any other alarming symptoms.  Please follow-up your doctor as instructed in a week time and call the office for appointment.  Please avoid alcohol, smoking, or any other illicit substance and maintain healthy habits including taking your regular medications as prescribed.  You were cared for by a hospitalist during your hospital stay. If you have any questions about your discharge medications or the care you received while you  were in the hospital after you are discharged, you can call the unit and ask to speak with the hospitalist on call if the hospitalist that took care of you is not available.  Once you are discharged, your primary care physician will handle any further medical issues. Please note that NO REFILLS for any discharge medications will be authorized once you  are discharged, as it is imperative that you return to your primary care physician (or establish a relationship with a primary care physician if you do not have one) for your aftercare needs so that they can reassess your need for medications and monitor your lab values   Increase activity slowly   Complete by: As directed       Allergies as of 12/04/2021       Reactions   Enalapril Swelling   Reported by [facility] - pt is not aware        Medication List     TAKE these medications    amLODipine 10 MG tablet Commonly known as: NORVASC TAKE 1 TABLET BY MOUTH EVERY DAY What changed: when to take this   aspirin 81 MG EC tablet Take 1 tablet (81 mg total) by mouth daily.   atorvastatin 40 MG tablet Commonly known as: LIPITOR TAKE 1 TABLET BY MOUTH DAILY AT 6 PM. What changed: when to take this   calcitRIOL 0.5 MCG capsule Commonly known as: ROCALTROL Take 0.5 mcg by mouth every morning.   doxazosin 4 MG tablet Commonly known as: CARDURA TAKE 1 TABLET BY MOUTH TWICE A DAY   ferrous gluconate 240 (27 FE) MG tablet Commonly known as: FERGON Take 240 mg by mouth daily.   furosemide 80 MG tablet Commonly known as: LASIX Take 80 mg by mouth 2 (two) times daily.   hydrALAZINE 25 MG tablet Commonly known as: APRESOLINE Take 1 tablet (25 mg total) by mouth 2 (two) times daily. What changed: when to take this   labetalol 300 MG tablet Commonly known as: NORMODYNE TAKE 1 TABLET BY MOUTH TWICE A DAY   lidocaine-prilocaine cream Commonly known as: EMLA Apply 1 application topically daily as needed (prior to port access).   multivitamin with minerals tablet Take 1 tablet by mouth daily.   omeprazole 20 MG capsule Commonly known as: PRILOSEC Take 20 mg by mouth daily.   oseltamivir 30 MG capsule Commonly known as: TAMIFLU Take 1 capsule (30 mg total) by mouth every Monday, Wednesday, and Friday with hemodialysis for 1 dose. Start taking on: December 08, 2021    predniSONE 10 MG tablet Commonly known as: DELTASONE Take 2 tablets (20 mg total) by mouth daily for 5 days.   Saw Palmetto Caps Take 1 capsule by mouth daily.        Follow-up Information     [name], FNP Follow up in 1 week(s).   Specialty: General Practice Contact information: [address] [place] [address] [phone]         Justin Mend, MD .   Specialty: Internal Medicine Contact information: [address] [place] [address] [phone]                Allergies  Allergen Reactions   Enalapril Swelling    Reported by [facility] - pt is not aware    The results of significant diagnostics from this hospitalization (including imaging, microbiology, ancillary and laboratory) are listed below for reference.    Microbiology: Recent Results (from the past 240 hour(s))  Resp  Panel by RT-PCR (Flu A&B, Covid) Nasopharyngeal Swab     Status: Abnormal   Collection Time: 12/01/21 11:35 PM   Specimen: Nasopharyngeal Swab; Nasopharyngeal(NP) swabs in vial transport medium  Result Value Ref Range Status   SARS Coronavirus 2 by RT PCR NEGATIVE NEGATIVE Final    Comment: (NOTE) SARS-CoV-2 target nucleic acids are NOT DETECTED.  The SARS-CoV-2 RNA is generally detectable in upper respiratory specimens during the acute phase of infection. The lowest concentration of SARS-CoV-2 viral copies this assay can detect is 138 copies/mL. A negative result does not preclude SARS-Cov-2 infection and should not be used as the sole basis for treatment or other patient management decisions. A negative result may occur with  improper specimen collection/handling, submission of specimen other than nasopharyngeal swab, presence of viral mutation(s) within the areas targeted by this assay, and inadequate number of viral copies(<138 copies/mL). A negative result must be combined with clinical observations, patient history, and  epidemiological information. The expected result is Negative.  Fact Sheet for Patients:  EntrepreneurPulse.com.au  Fact Sheet for Healthcare Providers:  IncredibleEmployment.be  This test is no t yet approved or cleared by the [place] FDA and  has been authorized for detection and/or diagnosis of SARS-CoV-2 by FDA under an Emergency Use Authorization (EUA). This EUA will remain  in effect (meaning this test can be used) for the duration of the COVID-19 declaration under Section 564(b)(1) of the Act, 21 U.S.C.section 360bbb-3(b)(1), unless the authorization is terminated  or revoked sooner.       Influenza A by PCR POSITIVE (A) NEGATIVE Final   Influenza B by PCR NEGATIVE NEGATIVE Final    Comment: (NOTE) The Xpert Xpress SARS-CoV-2/FLU/RSV plus assay is intended as an aid in the diagnosis of influenza from Nasopharyngeal swab specimens and should not be used as a sole basis for treatment. Nasal washings and aspirates are unacceptable for Xpert Xpress SARS-CoV-2/FLU/RSV testing.  Fact Sheet for Patients: EntrepreneurPulse.com.au  Fact Sheet for Healthcare Providers: IncredibleEmployment.be  This test is not yet approved or cleared by the [place] FDA and has been authorized for detection and/or diagnosis of SARS-CoV-2 by FDA under an Emergency Use Authorization (EUA). This EUA will remain in effect (meaning this test can be used) for the duration of the COVID-19 declaration under Section 564(b)(1) of the Act, 21 U.S.C. section 360bbb-3(b)(1), unless the authorization is terminated or revoked.  Performed at [facility], [place] [address] [place], [place] [address]   MRSA Next Gen by PCR, Nasal     Status: None   Collection Time: 12/02/21  2:16 PM   Specimen: Nasal Mucosa; Nasal Swab  Result Value Ref Range Status   MRSA by PCR Next Gen NOT DETECTED NOT DETECTED Final    Comment:  (NOTE) The GeneXpert MRSA Assay (FDA approved for NASAL specimens only), is one component of a comprehensive MRSA colonization surveillance program. It is not intended to diagnose MRSA infection nor to guide or monitor treatment for MRSA infections. Test performance is not FDA approved in patients less than 41 years old. Performed at [facility], [place] [address] [place], [place] [address]   Expectorated Sputum Assessment w Gram Stain, Rflx to Resp Cult     Status: None   Collection Time: 12/03/21  4:40 AM   Specimen: Sputum  Result Value Ref Range Status   Specimen Description SPUTUM  Final   Special Requests NONE  Final   Sputum evaluation   Final    THIS  SPECIMEN IS ACCEPTABLE FOR SPUTUM CULTURE Performed at [facility], [place] [address] [place], [place] [address]    Report Status 12/03/2021 FINAL  Final  Culture, Respiratory w Gram Stain     Status: None (Preliminary result)   Collection Time: 12/03/21  4:40 AM   Specimen: SPU  Result Value Ref Range Status   Specimen Description SPUTUM  Final   Special Requests NONE Reflexed from [id number]  Final   Gram Stain   Final    FEW SQUAMOUS EPITHELIAL CELLS PRESENT MODERATE WBC SEEN MODERATE GRAM POSITIVE RODS MODERATE GRAM NEGATIVE RODS ABUNDANT GRAM POSITIVE COCCI    Culture   Final    CULTURE REINCUBATED FOR BETTER GROWTH Performed at [facility], [place] [address] [place], [place] [address]    Report Status PENDING  Incomplete    Procedures/Studies: DG Chest 2 View  Result Date: 12/01/2021 CLINICAL DATA:  Shortness of breath EXAM: CHEST - 2 VIEW COMPARISON:  None. FINDINGS: The heart size and mediastinal contours are within normal limits. Both lungs are clear. Linear atelectasis along the right minor fissure. Elevation of the right hemidiaphragm. The visualized skeletal structures are unremarkable. IMPRESSION: No active cardiopulmonary disease. Electronically Signed   By: Keane Police D.O.   On: 12/01/2021  12:50    Labs: BNP (last 3 results) No results for input(s): BNP in the last 8760 hours. Basic Metabolic Panel: Recent Labs  Lab 12/01/21 1235 12/03/21 0535 12/04/21 0037  NA 139 136 135  K 4.2 3.8 4.2  CL 101 99 99  CO2 25 25 25   GLUCOSE 103* 98 148*  BUN 61* 91* 66*  CREATININE 8.63* 9.93* 7.52*  CALCIUM 8.8* 8.3* 8.0*  MG  --  1.6*  --   PHOS  --  6.0*  --    Liver Function Tests: Recent Labs  Lab 12/01/21 1235 12/03/21 0535  AST 39 53*  ALT 26 35  ALKPHOS 42 35*  BILITOT 0.9 0.7  PROT 6.4* 5.5*  ALBUMIN 3.4* 2.7*   No results for input(s): LIPASE, AMYLASE in the last 168 hours. No results for input(s): AMMONIA in the last 168 hours. CBC: Recent Labs  Lab 12/01/21 1235 12/03/21 0535  WBC 9.4 7.6  NEUTROABS 7.6 5.7  HGB 9.0* 8.2*  HCT 30.9* 28.3*  MCV 92.8 91.9  PLT 228 206   Cardiac Enzymes: No results for input(s): CKTOTAL, CKMB, CKMBINDEX, TROPONINI in the last 168 hours. BNP: Invalid input(s): POCBNP CBG: No results for input(s): GLUCAP in the last 168 hours. D-Dimer No results for input(s): DDIMER in the last 72 hours. Hgb A1c No results for input(s): HGBA1C in the last 72 hours. Lipid Profile No results for input(s): CHOL, HDL, LDLCALC, TRIG, CHOLHDL, LDLDIRECT in the last 72 hours. Thyroid function studies No results for input(s): TSH, T4TOTAL, T3FREE, THYROIDAB in the last 72 hours.  Invalid input(s): FREET3 Anemia work up No results for input(s): VITAMINB12, FOLATE, FERRITIN, TIBC, IRON, RETICCTPCT in the last 72 hours. Urinalysis    Component Value Date/Time   BILIRUBINUR negative 12/07/2020 1604   PROTEINUR Positive (A) 12/07/2020 1604   UROBILINOGEN 0.2 12/07/2020 1604   NITRITE negative 12/07/2020 1604   LEUKOCYTESUR Trace (A) 12/07/2020 1604   Sepsis Labs Invalid input(s): PROCALCITONIN,  WBC,  LACTICIDVEN Microbiology Recent Results (from the past 240 hour(s))  Resp Panel by RT-PCR (Flu A&B, Covid) Nasopharyngeal Swab      Status: Abnormal   Collection Time: 12/01/21 11:35 PM   Specimen: Nasopharyngeal  Swab; Nasopharyngeal(NP) swabs in vial transport medium  Result Value Ref Range Status   SARS Coronavirus 2 by RT PCR NEGATIVE NEGATIVE Final    Comment: (NOTE) SARS-CoV-2 target nucleic acids are NOT DETECTED.  The SARS-CoV-2 RNA is generally detectable in upper respiratory specimens during the acute phase of infection. The lowest concentration of SARS-CoV-2 viral copies this assay can detect is 138 copies/mL. A negative result does not preclude SARS-Cov-2 infection and should not be used as the sole basis for treatment or other patient management decisions. A negative result may occur with  improper specimen collection/handling, submission of specimen other than nasopharyngeal swab, presence of viral mutation(s) within the areas targeted by this assay, and inadequate number of viral copies(<138 copies/mL). A negative result must be combined with clinical observations, patient history, and epidemiological information. The expected result is Negative.  Fact Sheet for Patients:  EntrepreneurPulse.com.au  Fact Sheet for Healthcare Providers:  IncredibleEmployment.be  This test is no t yet approved or cleared by the [place] FDA and  has been authorized for detection and/or diagnosis of SARS-CoV-2 by FDA under an Emergency Use Authorization (EUA). This EUA will remain  in effect (meaning this test can be used) for the duration of the COVID-19 declaration under Section 564(b)(1) of the Act, 21 U.S.C.section 360bbb-3(b)(1), unless the authorization is terminated  or revoked sooner.       Influenza A by PCR POSITIVE (A) NEGATIVE Final   Influenza B by PCR NEGATIVE NEGATIVE Final    Comment: (NOTE) The Xpert Xpress SARS-CoV-2/FLU/RSV plus assay is intended as an aid in the diagnosis of influenza from Nasopharyngeal swab specimens and should not be used as a sole  basis for treatment. Nasal washings and aspirates are unacceptable for Xpert Xpress SARS-CoV-2/FLU/RSV testing.  Fact Sheet for Patients: EntrepreneurPulse.com.au  Fact Sheet for Healthcare Providers: IncredibleEmployment.be  This test is not yet approved or cleared by the [place] FDA and has been authorized for detection and/or diagnosis of SARS-CoV-2 by FDA under an Emergency Use Authorization (EUA). This EUA will remain in effect (meaning this test can be used) for the duration of the COVID-19 declaration under Section 564(b)(1) of the Act, 21 U.S.C. section 360bbb-3(b)(1), unless the authorization is terminated or revoked.  Performed at [facility], [place] [address] [place], [place] [address]   MRSA Next Gen by PCR, Nasal     Status: None   Collection Time: 12/02/21  2:16 PM   Specimen: Nasal Mucosa; Nasal Swab  Result Value Ref Range Status   MRSA by PCR Next Gen NOT DETECTED NOT DETECTED Final    Comment: (NOTE) The GeneXpert MRSA Assay (FDA approved for NASAL specimens only), is one component of a comprehensive MRSA colonization surveillance program. It is not intended to diagnose MRSA infection nor to guide or monitor treatment for MRSA infections. Test performance is not FDA approved in patients less than 40 years old. Performed at [facility], [place] [address] [place], [place] [address]   Expectorated Sputum Assessment w Gram Stain, Rflx to Resp Cult     Status: None   Collection Time: 12/03/21  4:40 AM   Specimen: Sputum  Result Value Ref Range Status   Specimen Description SPUTUM  Final   Special Requests NONE  Final   Sputum evaluation   Final    THIS SPECIMEN IS ACCEPTABLE FOR SPUTUM CULTURE Performed at [facility], [place] [address] [place], [place] [address]    Report Status 12/03/2021 FINAL  Final  Culture, Respiratory w Gram Stain     Status: None (Preliminary result)   Collection Time:  12/03/21  4:40 AM   Specimen: SPU  Result Value Ref Range Status   Specimen Description SPUTUM  Final   Special Requests NONE Reflexed from [id number]  Final   Gram Stain   Final    FEW SQUAMOUS EPITHELIAL CELLS PRESENT MODERATE WBC SEEN MODERATE GRAM POSITIVE RODS MODERATE GRAM NEGATIVE RODS ABUNDANT GRAM POSITIVE COCCI    Culture   Final    CULTURE REINCUBATED FOR BETTER GROWTH Performed at [facility], [place] [address], [place],  [address]    Report Status PENDING  Incomplete     Time coordinating discharge: 25 minutes  SIGNED: Antonieta Pert, MD  [facility] 12/04/2021, 10:39 AM  If 7PM-7AM, please contact night-coverage [email or web address]

## 2021-12-04 NOTE — Progress Notes (Signed)
[facility] Progress Note  Subjective:  Weened off O2 PT worked with pt, ok for DC to home For HD today  Vitals:   12/04/21 0303 12/04/21 0548 12/04/21 0736 12/04/21 0815  BP: 116/69  118/70   Pulse: 76  80   Resp: 19  18   Temp: 98.4 F (36.9 C)  98 F (36.7 C)   TempSrc: Oral  Oral   SpO2: 98%  96% 96%  Weight:  84 kg    Height:        Exam:  alert, nad , conversant  no jvd  Chest cta bilat  Cor reg no RG  Abd soft ntnd no ascites   Ext no LE edema   Alert, NF, ox3   RUE AVF+bruit     OP HD: MWF G-O  - new ESRD started 11/27/21, establishing orders >   3.5h  350/600  88.5kg  3K/2.25 bath  RUE AVF  Hep none - Mircera 62mcg Iv q 4 weeks - not yet given - Venofer 100mg  Iv x 10 doses - not yet given   Assessment/Plan:  Flu A, acute respiratory failure: Improving.  tamiflu + nebulized albuterol + steroids.  ESRD: Back on schedule today, no inpatient issues; ok for DC after HD  Hypertension/volume: BP ok - continue home meds.  CXR clear. Under dry wt, but new to dialysis and requiring O2.   Anemia: Drifting down, not surprising, CTM.    Metabolic bone disease: Ca ok, Phos 6.0;  outpt mgmt.  Nutrition:  Alb low, will add supplements.  Rexene Agent, MD  12/04/2021, 9:44 AM   Recent Labs  Lab 12/01/21 1235 12/03/21 0535 12/04/21 0037  K 4.2 3.8 4.2  BUN 61* 91* 66*  CREATININE 8.63* 9.93* 7.52*  CALCIUM 8.8* 8.3* 8.0*  PHOS  --  6.0*  --   HGB 9.0* 8.2*  --     Inpatient medications:  (feeding supplement) PROSource Plus  30 mL Oral BID BM   amLODipine  10 mg Oral Daily   aspirin EC  81 mg Oral Daily   atorvastatin  40 mg Oral Daily   calcitRIOL  0.25 mcg Oral Once per day on Mon Wed Fri   Chlorhexidine Gluconate Cloth  6 each Topical Q0600   doxazosin  4 mg Oral BID   ferrous gluconate  324 mg Oral Q breakfast   heparin  5,000 Units Subcutaneous Q8H   hydrALAZINE  25 mg Oral BID   ipratropium-albuterol  3 mL Nebulization TID    labetalol  300 mg Oral BID   magnesium oxide  400 mg Oral BID   methylPREDNISolone (SOLU-MEDROL) injection  40 mg Intravenous Daily   oseltamivir  30 mg Oral Q M,W,F-HD   pantoprazole  40 mg Oral Daily    acetaminophen **OR** acetaminophen, albuterol, ondansetron **OR** ondansetron (ZOFRAN) IV, oxyCODONE

## 2021-12-04 NOTE — Plan of Care (Signed)
  Problem: Education: Goal: Knowledge of General Education information will improve Description: Including pain rating scale, medication(s)/side effects and non-pharmacologic comfort measures Outcome: Progressing   Problem: Health Behavior/Discharge Planning: Goal: Ability to manage health-related needs will improve Outcome: Progressing   Problem: Clinical Measurements: Goal: Ability to maintain clinical measurements within normal limits will improve Outcome: Progressing Goal: Will remain free from infection Outcome: Progressing Goal: Diagnostic test results will improve Outcome: Progressing Goal: Cardiovascular complication will be avoided Outcome: Progressing   Problem: Activity: Goal: Risk for activity intolerance will decrease Outcome: Progressing   Problem: Nutrition: Goal: Adequate nutrition will be maintained Outcome: Progressing   Problem: Coping: Goal: Level of anxiety will decrease Outcome: Progressing   Problem: Elimination: Goal: Will not experience complications related to bowel motility Outcome: Progressing Goal: Will not experience complications related to urinary retention Outcome: Progressing   Problem: Pain Managment: Goal: General experience of comfort will improve Outcome: Progressing   Problem: Safety: Goal: Ability to remain free from injury will improve Outcome: Progressing   Problem: Skin Integrity: Goal: Risk for impaired skin integrity will decrease Outcome: Progressing   Problem: Clinical Measurements: Goal: Respiratory complications will improve Outcome: Not Progressing  Pt continues to have expiratory wheezes and SOB with mild activity.

## 2021-12-04 NOTE — Progress Notes (Signed)
Back from hemodialysis by bed awake and alert. 

## 2021-12-04 NOTE — Progress Notes (Signed)
OT Cancellation Note  Patient Details Name: Ryan Salazar MRN: [id number] DOB: 05-05-1951   Cancelled Treatment:    Reason Eval/Treat Not Completed: Patient at procedure or test/ unavailable. Pt currently in dialysis.  [place], OTR/L Acute Rehab Services Pager [phone] Office [phone]    Almon Register 12/04/2021, 1:14 PM

## 2021-12-04 NOTE — Evaluation (Signed)
Physical Therapy Evaluation Patient Details Name: Ryan Salazar MRN: [id number] DOB: [birth year] Today's Date: 12/04/2021  History of Present Illness  The pt is a 70 yo male presenting 12/2 from dialysis with SOB. Upon work up, pt with acute resp failure with hypoxia and the flu. PMH includes: ESRD on HD MWF, HLD, GERD, HTN, and stroke.   Clinical Impression  Pt in bed upon arrival of PT, agreeable to evaluation at this time. Prior to admission the pt was completely independent with mobility, taking care of his mother, and working full time in an Education officer, community. The pt now presents with minor limitations in functional mobility and dynamic stability due to above dx, and will continue to benefit from skilled PT to address these deficits. The pt was able to complete bed mobility without any impairments, but demos mild instability with sit-stand transfers and hallway ambulation. He had x2 minor LOB with gait needing brief minA or UE support on hallway railings. Upon further testing, the pt demos increased sway with narrow BOS and with eyes closed. Pt feels this new instability is due to spending 2 days in bed and I tend to agree, we discussed fall risk reduction strategies and potential use of SPC after d/c and pt is in agreement with plan. The pt is safe to return home when medically stable, but will benefit from skilled PT acutely to further challenge and progress dynamic instability.   Session conducted on RA with SpO2 98-100% with all mobility, pt denies SOB.         Recommendations for follow up therapy are one component of a multi-disciplinary discharge planning process, led by the attending physician.  Recommendations may be updated based on patient status, additional functional criteria and insurance authorization.  Follow Up Recommendations No PT follow up    Assistance Recommended at Discharge PRN  Functional Status Assessment Patient has had a recent decline in their functional status and  demonstrates the ability to make significant improvements in function in a reasonable and predictable amount of time.  Equipment Recommendations  None recommended by PT    Recommendations for Other Services       Precautions / Restrictions Precautions Precautions: Fall Restrictions Weight Bearing Restrictions: No      Mobility  Bed Mobility Overal bed mobility: Independent                  Transfers Overall transfer level: Needs assistance Equipment used: None Transfers: Sit to/from Stand Sit to Stand: Supervision           General transfer comment: supervision for safety, pt with mild instability on standing but able to steady without assist or LOB. 5x sit-stand without UE in 15.16 sec    Ambulation/Gait Ambulation/Gait assistance: Min guard;Min assist Gait Distance (Feet): 250 Feet Assistive device: None Gait Pattern/deviations: Step-through pattern;Decreased stride length Gait velocity: WFL     General Gait Details: pt with mild instability and x2 minor LOB needing minA to steady. VSS on RA.     Balance Overall balance assessment: Mild deficits observed, not formally tested                               Standardized Balance Assessment Standardized Balance Assessment : Dynamic Gait Index   Dynamic Gait Index Level Surface: Mild Impairment Gait with Horizontal Head Turns: Mild Impairment Gait with Vertical Head Turns: Mild Impairment Gait and Pivot Turn: Normal Step Over Obstacle: Normal  Pertinent Vitals/Pain Pain Assessment: No/denies pain    Home Living Family/patient expects to be discharged to:: Private residence Living Arrangements: Parent Available Help at Discharge: Family;Available 24 hours/day (pt takes care of his mother) Type of Home: House Home Access: Stairs to enter Entrance Stairs-Rails: None Entrance Stairs-Number of Steps: 2   Home Layout: One level (one step into den) Home Equipment: Marine scientist  - single point Additional Comments: 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 takes care of his mother ADLs Comments: independent with all ADLs and IADLs     Hand Dominance   Dominant Hand: Right    Extremity/Trunk Assessment   Upper Extremity Assessment Upper Extremity Assessment: Overall WFL for tasks assessed    Lower Extremity Assessment Lower Extremity Assessment: Overall WFL for tasks assessed    Cervical / Trunk Assessment Cervical / Trunk Assessment: Normal  Communication   Communication: No difficulties  Cognition Arousal/Alertness: Awake/alert Behavior During Therapy: WFL for tasks assessed/performed Overall Cognitive Status: Within Functional Limits for tasks assessed                                          General Comments General comments (skin integrity, edema, etc.): Pt on 2L prior to session, stable on RA with all mobility    Exercises Other Exercises Other Exercises: 5x sit-stand from recliner without use of UE: 15 sec   Assessment/Plan    PT Assessment Patient needs continued PT services  PT Problem List Decreased strength;Decreased activity tolerance;Decreased range of motion;Decreased mobility;Decreased balance       PT Treatment Interventions DME instruction;Gait training;Stair training;Functional mobility training;Therapeutic activities;Therapeutic exercise;Balance training;Patient/family education    PT Goals (Current goals can be found in the Care Plan section)  Acute Rehab PT Goals Patient Stated Goal: return to work PT Goal Formulation: With patient Time For Goal Achievement: 12/18/21 Potential to Achieve Goals: Good    Frequency Min 3X/week    AM-PAC PT "6  Clicks" Mobility  Outcome Measure Help needed turning from your back to your side while in a flat bed without using bedrails?: None Help needed moving from lying on your back to sitting on the side of a flat bed without using bedrails?: None Help needed moving to and from a bed to a chair (including a wheelchair)?: A Little Help needed standing up from a chair using your arms (e.g., wheelchair or bedside chair)?: A Little Help needed to walk in hospital room?: A Little Help needed climbing 3-5 steps with a railing? : A Little 6 Click Score: 20    End of Session Equipment Utilized During Treatment: Gait belt Activity Tolerance: Patient tolerated treatment well Patient left: in chair;with call bell/phone within reach Nurse Communication: Mobility status PT Visit Diagnosis: Other abnormalities of gait and mobility (R26.89);Unsteadiness on feet (R26.81)    Time: 8101-7510 PT Time Calculation (min) (ACUTE ONLY): 29 min   Charges:   PT Evaluation $PT Eval Low Complexity: 1 Low PT Treatments $Gait Training: 8-22 mins        West Carbo, PT, DPT   Acute Rehabilitation Department Pager #: [phone]  Sandra Cockayne 12/04/2021, 9:04 AM

## 2021-12-04 NOTE — TOC Initial Note (Signed)
Transition of Care [facility]) - Initial/Assessment Note    Patient Details  Name: Ryan Salazar MRN: [id number] Date of Birth: [birth year]  [facility]) CM/SW Contact:    Zenon Mayo, RN Phone Number: 12/04/2021, 4:12 PM  Clinical Narrative:                 Patient is from home with parent, he states his brother will transport him home at dc.  He was upset that someone took his cake out of his room when he got back from dialysis.  He states he has a cane at home but does not need.it.  he has no other needs.  Expected Discharge Plan: Home/Self Care Barriers to Discharge: No Barriers Identified   Patient Goals and CMS Choice Patient states their goals for this hospitalization and ongoing recovery are:: return home   Choice offered to / list presented to : NA  Expected Discharge Plan and Services Expected Discharge Plan: Home/Self Care   Discharge Planning Services: CM Consult Post Acute Care Choice: NA Living arrangements for the past 2 months: Single Family Home Expected Discharge Date: 12/04/21                 DME Agency: NA       HH Arranged: NA          Prior Living Arrangements/Services Living arrangements for the past 2 months: Single Family Home Lives with:: Parents Patient language and need for interpreter reviewed:: Yes Do you feel safe going back to the place where you live?: Yes      Need for Family Participation in Patient Care: Yes (Comment) Care giver support system in place?: Yes (comment) Current home services: DME (cane) Criminal Activity/Legal Involvement Pertinent to Current Situation/Hospitalization: No - Comment as needed  Activities of Daily Living Home Assistive Devices/Equipment: Eyeglasses ADL Screening (condition at time of admission) Patient's cognitive ability adequate to safely complete daily activities?: No Is the patient deaf or have difficulty hearing?: No Does the patient have difficulty seeing, even when wearing  glasses/contacts?: No Does the patient have difficulty concentrating, remembering, or making decisions?: No Patient able to express need for assistance with ADLs?: Yes Does the patient have difficulty dressing or bathing?: No Independently performs ADLs?: Yes (appropriate for developmental age) Does the patient have difficulty walking or climbing stairs?: No Weakness of Legs: None Weakness of Arms/Hands: None  Permission Sought/Granted                  Emotional Assessment       Orientation: : Oriented to Self, Oriented to Place, Oriented to  Time, Oriented to Situation Alcohol / Substance Use: Not Applicable Psych Involvement: No (comment)  Admission diagnosis:  Influenza A [J10.1] Acute respiratory failure with hypoxia (HCC) [J96.01] Moderate persistent reactive airway disease without complication [E33.29] Acute hypoxemic respiratory failure ([place]) [J96.01] Patient Active Problem List   Diagnosis Date Noted   Acute respiratory failure with hypoxia ([place]) 12/02/2021   Influenza A 12/02/2021   Protein calorie malnutrition ([place]) 12/02/2021   Elevated troponin 12/02/2021   ESRD (end stage renal disease) ([place]) 12/02/2021   Abnormal glucose 12/07/2019   Tobacco abuse 08/04/2019   Left arm weakness 08/04/2019   Ischemic stroke ([place]) 08/04/2019   Acute ischemic stroke ([place]) 07/21/2019   CKD (chronic kidney disease) stage 4, GFR 15-29 ml/min ([place]) 07/21/2019   Acute otitis media 06/11/2019   Excessive cerumen in right ear canal 06/11/2019   Right hand pain 06/03/2019  Essential hypertension 12/28/2018   Acute pain of left knee 12/28/2018   Stage 2 chronic kidney disease 12/28/2018   Nocturia 12/28/2018   PCP:  [name], FNP Pharmacy:   [id number] - [address], [place] [address] Phone: [phone] Fax: [phone]  [facility] [address] [place]  [address] Phone: [phone] Fax: [phone]     Social Determinants of Health (SDOH) Interventions    Readmission Risk Interventions Readmission Risk Prevention Plan 12/04/2021  Transportation Screening Complete  PCP or Specialist Appt within 3-5 Days Complete  HRI or [place] Complete  Social Work Consult for [place] Planning/Counseling Complete  Palliative Care Screening Not Applicable  Medication Review Press photographer) Complete  Some recent data might be hidden

## 2021-12-04 NOTE — TOC Transition Note (Signed)
Transition of Care [facility]) - CM/SW Discharge Note   Patient Details  Name: Ryan Salazar MRN: [id number] Date of Birth: Jul 16, 1951  Transition [facility] Care [facility]) CM/SW Contact:  Zenon Mayo, RN Phone Number: 12/04/2021, 4:13 PM   Clinical Narrative:    Patient is from home with parent, he states his brother will transport him home at dc.  He was upset that someone took his cake out of his room when he got back from dialysis.  He states he has a cane at home but does not need.it.  he has no other needs.   Final next level of care: Home/Self Care Barriers to Discharge: No Barriers Identified   Patient Goals and CMS Choice Patient states their goals for this hospitalization and ongoing recovery are:: return home   Choice offered to / list presented to : NA  Discharge Placement                       Discharge Plan and Services   Discharge Planning Services: CM Consult Post Acute Care Choice: NA            DME Agency: NA       HH Arranged: NA          Social Determinants of Health (SDOH) Interventions     Readmission Risk Interventions Readmission Risk Prevention Plan 12/04/2021  Transportation Screening Complete  PCP or Specialist Appt within 3-5 Days Complete  HRI or [place] Complete  Social Work Consult for [place] Planning/Counseling Complete  Palliative Care Screening Not Applicable  Medication Review Press photographer) Complete  Some recent data might be hidden

## 2021-12-04 NOTE — Progress Notes (Signed)
Transported to  hemodialysis by bed awake and alert. 

## 2021-12-04 NOTE — Progress Notes (Signed)
All set for discharge home. Awaiting  for a ride home.

## 2021-12-05 LAB — CULTURE, RESPIRATORY W GRAM STAIN: Culture: NORMAL

## 2021-12-07 ENCOUNTER — Ambulatory Visit (INDEPENDENT_AMBULATORY_CARE_PROVIDER_SITE_OTHER): Payer: BC Managed Care – PPO | Admitting: Nurse Practitioner

## 2021-12-07 ENCOUNTER — Other Ambulatory Visit: Payer: Self-pay

## 2021-12-07 ENCOUNTER — Encounter: Payer: Self-pay | Admitting: Nurse Practitioner

## 2021-12-07 VITALS — BP 104/78 | HR 78 | Temp 98.7°F | Ht 71.0 in | Wt 185.4 lb

## 2021-12-07 DIAGNOSIS — N186 End stage renal disease: Secondary | ICD-10-CM

## 2021-12-07 DIAGNOSIS — J9601 Acute respiratory failure with hypoxia: Secondary | ICD-10-CM | POA: Diagnosis not present

## 2021-12-07 DIAGNOSIS — I129 Hypertensive chronic kidney disease with stage 1 through stage 4 chronic kidney disease, or unspecified chronic kidney disease: Secondary | ICD-10-CM | POA: Diagnosis not present

## 2021-12-07 DIAGNOSIS — R051 Acute cough: Secondary | ICD-10-CM

## 2021-12-07 DIAGNOSIS — J101 Influenza due to other identified influenza virus with other respiratory manifestations: Secondary | ICD-10-CM

## 2021-12-07 DIAGNOSIS — I639 Cerebral infarction, unspecified: Secondary | ICD-10-CM

## 2021-12-07 DIAGNOSIS — Z992 Dependence on renal dialysis: Secondary | ICD-10-CM

## 2021-12-07 MED ORDER — BENZONATATE 100 MG PO CAPS: 100.0000 mg | ORAL_CAPSULE | Freq: Four times a day (QID) | ORAL | 1 refills | Status: DC | PRN

## 2021-12-07 MED ORDER — ALBUTEROL SULFATE HFA 108 (90 BASE) MCG/ACT IN AERS: 2.0000 | INHALATION_SPRAY | Freq: Four times a day (QID) | RESPIRATORY_TRACT | 2 refills | Status: DC | PRN

## 2021-12-07 NOTE — Progress Notes (Signed)
I,Ryan Salazar,acting as a Education administrator for Ryan Brine, FNP.,have documented all relevant documentation on the behalf of Ryan Brine, FNP,as directed by  Ryan Brine, FNP while in the presence of Ryan Salazar, Ryan Salazar.   This visit occurred during the SARS-CoV-2 public health emergency.  Safety protocols were in place, including screening questions prior to the visit, additional usage of staff PPE, and extensive cleaning of exam room while observing appropriate contact time as indicated for disinfecting solutions.  Subjective:     Patient ID: Ryan Salazar , male    DOB: [birth year] , 70 y.o.   MRN: [id number]   Chief Complaint  Patient presents with   hospital f/u    HPI  Patient presents today for a hospital f/u. Pt does admit to feeling better after being discharged. He had his first session of dialysis was Monday at [facility] overall doing well with that. He had not been able to sleep for 2 nights and could not breath he was admitted him for Influenza A. He was treated with tamiflu, oral steroid and albuterol inhaler which he does not have at home. He is wheezing at night.  He was cleared by PT OT.    jHe is now in ESRD on HD MWF but did not get on Friday. He did have HD at the hospital prior to being discharged. He reports overall he is doing okay with the dialysis    Past Medical History:  Diagnosis Date   Anemia    Arthritis    Blood transfusion 1989   Chronic kidney disease    Dyspnea    on exertion - no oxygen   GERD (gastroesophageal reflux disease)    HLD (hyperlipidemia)    Hypertension    Sleep apnea    Stroke ([place]) 07/2019   no residual   Ulcer 1989   BLEEDING STOMACH ULCER     Family History  Problem Relation Age of Onset   Stroke Mother    Healthy Father    Colon cancer Neg Hx    Colon polyps Neg Hx      Current Outpatient Medications:    albuterol (VENTOLIN HFA) 108 (90 Base) MCG/ACT inhaler, Inhale 2 puffs into the lungs every 6 (six) hours as needed  for wheezing or shortness of breath., Disp: 18 g, Rfl: 2   amLODipine (NORVASC) 10 MG tablet, TAKE 1 TABLET BY MOUTH EVERY DAY (Patient taking differently: Take 10 mg by mouth every morning.), Disp: 90 tablet, Rfl: 0   aspirin EC 81 MG EC tablet, Take 1 tablet (81 mg total) by mouth daily., Disp: 30 tablet, Rfl: 1   benzonatate (TESSALON PERLES) 100 MG capsule, Take 1 capsule (100 mg total) by mouth every 6 (six) hours as needed., Disp: 30 capsule, Rfl: 1   calcitRIOL (ROCALTROL) 0.5 MCG capsule, Take 0.5 mcg by mouth every morning., Disp: , Rfl:    ferrous gluconate (FERGON) 240 (27 FE) MG tablet, Take 240 mg by mouth daily., Disp: , Rfl:    hydrALAZINE (APRESOLINE) 25 MG tablet, Take 1 tablet (25 mg total) by mouth 2 (two) times daily., Disp: 60 tablet, Rfl: 0   lidocaine-prilocaine (EMLA) cream, Apply 1 application topically daily as needed (prior to port access)., Disp: , Rfl:    Misc Natural Products (SAW PALMETTO) CAPS, Take 1 capsule by mouth daily., Disp: , Rfl:    atorvastatin (LIPITOR) 40 MG tablet, TAKE 1 TABLET BY MOUTH DAILY AT 6 PM., Disp: 30 tablet, Rfl: 0   doxazosin (  CARDURA) 4 MG tablet, TAKE 1 TABLET BY MOUTH TWICE A DAY, Disp: 180 tablet, Rfl: 0   furosemide (LASIX) 80 MG tablet, Take 80 mg by mouth 2 (two) times daily., Disp: , Rfl:    labetalol (NORMODYNE) 300 MG tablet, Take 1 tablet (300 mg total) by mouth 2 (two) times daily., Disp: 90 tablet, Rfl: 1   Multiple Vitamins-Minerals (MULTIVITAMIN WITH MINERALS) tablet, Take 1 tablet by mouth daily., Disp: , Rfl:    omeprazole (PRILOSEC) 20 MG capsule, Take 20 mg by mouth daily. (Patient not taking: Reported on 12/02/2021), Disp: , Rfl:    Allergies  Allergen Reactions   Enalapril Swelling    Reported by [facility] - pt is not aware     Review of Systems  Constitutional: Negative.   Respiratory: Negative.    Cardiovascular: Negative.   Genitourinary: Negative.   Skin: Negative.   Allergic/Immunologic: Negative.    Neurological:  Negative for dizziness and headaches.  Hematological: Negative.     Today's Vitals   12/07/21 1443  BP: 104/78  Pulse: 78  Temp: 98.7 F (37.1 C)  Weight: 185 lb 6.4 oz (84.1 kg)  Height: 5' 11"  (1.803 m)   Body mass index is 25.86 kg/m.  Wt Readings from Last 3 Encounters:  12/07/21 185 lb 6.4 oz (84.1 kg)  12/04/21 191 lb 12.8 oz (87 kg)  08/09/21 202 lb (91.6 kg)    Objective:  Physical Exam Vitals reviewed.  Constitutional:      Appearance: Normal appearance. He is obese.  Cardiovascular:     Rate and Rhythm: Normal rate and regular rhythm.     Pulses: Normal pulses.     Heart sounds: Normal heart sounds. No murmur heard.    Arteriovenous access: Left arteriovenous access is present. Pulmonary:     Effort: No respiratory distress.     Breath sounds: Normal breath sounds. No wheezing.  Musculoskeletal:        General: No swelling.  Skin:    General: Skin is warm and dry.     Capillary Refill: Capillary refill takes less than 2 seconds.  Neurological:     General: No focal deficit present.     Mental Status: He is alert and oriented to person, place, and time.  Psychiatric:        Mood and Affect: Mood normal.        Behavior: Behavior normal.        Thought Content: Thought content normal.        Judgment: Judgment normal.        Assessment And Plan:     1. Acute respiratory failure with hypoxia (HCC) TCM not done   Related to having influenza, he feels he is doing better with his breathing but continues to wheeze at night. He has an albuterol inhaler as needed - BMP8+eGFR - CBC with Differential/Platelet  2. Acute cough - benzonatate (TESSALON PERLES) 100 MG capsule; Take 1 capsule (100 mg total) by mouth every 6 (six) hours as needed.  Dispense: 30 capsule; Refill: 1 - CBC with Differential/Platelet  3. Influenza A Treated with tamiflu while at hospital  4. ESRD (end stage renal disease) ([place]) Now on HD  5. Dependence on  hemodialysis [facility]) MWF treatment, doing well overall per patient   Patient was given opportunity to ask questions. Patient verbalized understanding of the plan and was able to repeat key elements of the plan. All questions were answered to their satisfaction.  Ryan Brine, FNP  Teola Bradley, FNP, have reviewed all documentation for this visit. The documentation on 12/07/21 for the exam, diagnosis, procedures, and orders are all accurate and complete.   IF YOU HAVE BEEN REFERRED TO A SPECIALIST, IT MAY TAKE 1-2 WEEKS TO SCHEDULE/PROCESS THE REFERRAL. IF YOU HAVE NOT HEARD FROM US/SPECIALIST IN TWO WEEKS, PLEASE GIVE [place] A CALL AT [phone] X [phone].   THE PATIENT IS ENCOURAGED TO PRACTICE SOCIAL DISTANCING DUE TO THE COVID-19 PANDEMIC.

## 2021-12-08 LAB — CBC WITH DIFFERENTIAL/PLATELET
Basophils Absolute: 0 10*3/uL (ref 0.0–0.2)
Basos: 0 %
EOS (ABSOLUTE): 0 10*3/uL (ref 0.0–0.4)
Eos: 0 %
Hematocrit: 27.6 % — ABNORMAL LOW (ref 37.5–51.0)
Hemoglobin: 8.7 g/dL — ABNORMAL LOW (ref 13.0–17.7)
Immature Grans (Abs): 0.2 10*3/uL — ABNORMAL HIGH (ref 0.0–0.1)
Immature Granulocytes: 2 %
Lymphocytes Absolute: 1.3 10*3/uL (ref 0.7–3.1)
Lymphs: 15 %
MCH: 26.6 pg (ref 26.6–33.0)
MCHC: 31.5 g/dL (ref 31.5–35.7)
MCV: 84 fL (ref 79–97)
Monocytes Absolute: 1.1 10*3/uL — ABNORMAL HIGH (ref 0.1–0.9)
Monocytes: 13 %
NRBC: 1 % — ABNORMAL HIGH (ref 0–0)
Neutrophils Absolute: 6.3 10*3/uL (ref 1.4–7.0)
Neutrophils: 70 %
Platelets: 284 10*3/uL (ref 150–450)
RBC: 3.27 x10E6/uL — ABNORMAL LOW (ref 4.14–5.80)
RDW: 13.5 % (ref 11.6–15.4)
WBC: 8.9 10*3/uL (ref 3.4–10.8)

## 2021-12-08 LAB — BMP8+EGFR
BUN/Creatinine Ratio: 10 (ref 10–24)
BUN: 64 mg/dL — ABNORMAL HIGH (ref 8–27)
CO2: 26 mmol/L (ref 20–29)
Calcium: 9.1 mg/dL (ref 8.6–10.2)
Chloride: 97 mmol/L (ref 96–106)
Creatinine, Ser: 6.42 mg/dL — ABNORMAL HIGH (ref 0.76–1.27)
Glucose: 89 mg/dL (ref 70–99)
Potassium: 4.4 mmol/L (ref 3.5–5.2)
Sodium: 145 mmol/L — ABNORMAL HIGH (ref 134–144)
eGFR: 9 mL/min/{1.73_m2} — ABNORMAL LOW (ref >59)

## 2021-12-18 ENCOUNTER — Telehealth: Payer: Self-pay

## 2021-12-18 NOTE — Telephone Encounter (Signed)
Transition Care Management Unsuccessful Follow-up Telephone Call  Date of discharge and from where:  [facility]/04/2021  [facility]  Attempts:  1st Attempt  Reason for unsuccessful TCM follow-up call:  No answer/busy  Tomasa Rand, RN, BSN, CEN [place] Coordinator [phone]

## 2021-12-22 ENCOUNTER — Other Ambulatory Visit: Payer: Self-pay | Admitting: Nurse Practitioner

## 2021-12-26 ENCOUNTER — Other Ambulatory Visit: Payer: Self-pay | Admitting: Nurse Practitioner

## 2021-12-29 ENCOUNTER — Telehealth: Payer: Self-pay | Admitting: *Deleted

## 2021-12-29 NOTE — Telephone Encounter (Signed)
Transition Care Management Unsuccessful Follow-up Telephone Call  Date of discharge and from where:  12/04/21 [facility]  Attempts:  2nd Attempt  Reason for unsuccessful TCM follow-up call:  Left voice message   Kelli Churn RN, CCM, [place] Management Coordinator - Managed [place] High Risk [phone]

## 2022-01-02 ENCOUNTER — Encounter (HOSPITAL_COMMUNITY): Payer: BC Managed Care – PPO

## 2022-01-06 ENCOUNTER — Other Ambulatory Visit: Payer: Self-pay | Admitting: Nurse Practitioner

## 2022-01-30 ENCOUNTER — Telehealth: Payer: Self-pay

## 2022-01-30 NOTE — Telephone Encounter (Signed)
Error

## 2022-02-14 ENCOUNTER — Telehealth: Payer: Self-pay

## 2022-02-14 ENCOUNTER — Other Ambulatory Visit: Payer: Self-pay | Admitting: Nurse Practitioner

## 2022-02-14 NOTE — Telephone Encounter (Signed)
I left the pt a message that I was calling to schedule him an appt because he doesn't have a follow-up a ppt scheduled.

## 2022-02-16 ENCOUNTER — Other Ambulatory Visit: Payer: Self-pay | Admitting: Nurse Practitioner

## 2022-03-06 ENCOUNTER — Encounter (HOSPITAL_COMMUNITY): Payer: Self-pay

## 2022-03-13 ENCOUNTER — Ambulatory Visit: Payer: BC Managed Care – PPO | Admitting: Nurse Practitioner

## 2022-03-18 ENCOUNTER — Other Ambulatory Visit: Payer: Self-pay | Admitting: Nurse Practitioner

## 2022-05-11 ENCOUNTER — Other Ambulatory Visit: Payer: Self-pay | Admitting: Nurse Practitioner

## 2022-05-12 ENCOUNTER — Other Ambulatory Visit: Payer: Self-pay | Admitting: Nurse Practitioner

## 2022-05-26 ENCOUNTER — Other Ambulatory Visit: Payer: Self-pay | Admitting: Nurse Practitioner

## 2022-06-04 ENCOUNTER — Other Ambulatory Visit: Payer: Self-pay

## 2022-06-04 ENCOUNTER — Ambulatory Visit (HOSPITAL_COMMUNITY)
Admission: EM | Admit: 2022-06-04 | Discharge: 2022-06-04 | Disposition: A | Discharge status: Home / Self Care | Payer: BC Managed Care – PPO | Attending: Urgent Care | Admitting: Urgent Care

## 2022-06-04 ENCOUNTER — Encounter (HOSPITAL_COMMUNITY): Payer: Self-pay | Admitting: Emergency Medicine

## 2022-06-04 DIAGNOSIS — Z91038 Other insect allergy status: Secondary | ICD-10-CM | POA: Diagnosis not present

## 2022-06-04 DIAGNOSIS — S40862A Insect bite (nonvenomous) of left upper arm, initial encounter: Secondary | ICD-10-CM

## 2022-06-04 DIAGNOSIS — W57XXXA Bitten or stung by nonvenomous insect and other nonvenomous arthropods, initial encounter: Secondary | ICD-10-CM

## 2022-06-04 MED ORDER — DOXYCYCLINE HYCLATE 100 MG PO CAPS: 100.0000 mg | ORAL_CAPSULE | Freq: Two times a day (BID) | ORAL | 0 refills | Status: AC

## 2022-06-04 MED ORDER — TRIAMCINOLONE ACETONIDE 0.1 % EX CREA: 1.0000 "application " | TOPICAL_CREAM | Freq: Two times a day (BID) | CUTANEOUS | 0 refills | Status: DC

## 2022-06-04 NOTE — ED Triage Notes (Signed)
Noticed a bump to left axilla.  Patient has been mashing on this bump.  Patient reports last night he was squeezing bump and aa "tick popped out and was still alive"

## 2022-06-04 NOTE — Discharge Instructions (Addendum)
Start taking the antibiotic twice daily, do not stop taking it just because you feel better. Monitor for any adverse reactions. Do not take it within two hours of milk consumption, and avoid multivitamins while on the antibiotic. Please avoid excessive sun exposure or tanning beds while taking. Drink plenty of water.   Apply topical steroid cream to the affected area of the L armpit twice daily for a maximum of 14 days, or until itching and redness resolves. You are being treated with a full course of antibiotics that would cover for lyme disease. Please follow up with your PCP in 3-4 weeks

## 2022-06-04 NOTE — ED Provider Notes (Signed)
[place]    CSN: [id number] Arrival date & time: 06/04/22  1614      History   Chief Complaint Chief Complaint  Patient presents with  . tick bite    HPI Ryan Salazar is a 71 y.o. male.   Pleasant 71 year old male with a known history of end-stage renal disease on dialysis presents today due to concern of a tick.  He was sent here by the recommendation of his nephrologist.  He states he had a painful lump to his left axilla for nearly 2 weeks, but just last evening squeezed the area and a tick came out.  He states the tick was engorged and full of blood.  He denies any fever or neurological symptoms.  No new joint pain.  He has not taken any medications for his symptoms other than topical neosporin to the bump.    Past Medical History:  Diagnosis Date  . Anemia   . Arthritis   . Blood transfusion 1989  . Chronic kidney disease   . Dyspnea    on exertion - no oxygen  . GERD (gastroesophageal reflux disease)   . HLD (hyperlipidemia)   . Hypertension   . Sleep apnea   . Stroke ([place]) 07/2019   no residual  . Ulcer 1989   BLEEDING STOMACH ULCER    Patient Active Problem List   Diagnosis Date Noted  . Acute respiratory failure with hypoxia ([place]) 12/02/2021  . Influenza A 12/02/2021  . Protein calorie malnutrition ([place]) 12/02/2021  . Elevated troponin 12/02/2021  . ESRD (end stage renal disease) ([place]) 12/02/2021  . Abnormal glucose 12/07/2019  . Tobacco abuse 08/04/2019  . Left arm weakness 08/04/2019  . Ischemic stroke ([place]) 08/04/2019  . Acute ischemic stroke ([place]) 07/21/2019  . CKD (chronic kidney disease) stage 4, GFR 15-29 ml/min (HCC) 07/21/2019  . Acute otitis media 06/11/2019  . Excessive cerumen in right ear canal 06/11/2019  . Right hand pain 06/03/2019  . Essential hypertension 12/28/2018  . Acute pain of left knee 12/28/2018  . Stage 2 chronic kidney disease 12/28/2018  . Nocturia 12/28/2018    Past Surgical History:  Procedure  Laterality Date  . AV FISTULA PLACEMENT Right 05/02/2021   Procedure: RIGHT ARM ARTERIOVENOUS (AV) FISTULA CREATION;  Surgeon: Waynetta Sandy, MD;  Location: [facility];  Service: Vascular;  Laterality: Right;  . COLONOSCOPY    . [place]  . TONSILLECTOMY  AS CHILD  . WISDOM TOOTH EXTRACTION         Home Medications    Prior to Admission medications   Medication Sig Start Date End Date Taking? Authorizing Provider  doxycycline (VIBRAMYCIN) 100 MG capsule Take 1 capsule (100 mg total) by mouth 2 (two) times daily for 14 days. 06/04/22 06/18/22 Yes Yusuke Beza L, PA  triamcinolone cream (KENALOG) 0.1 % Apply 1 application. topically 2 (two) times daily. To L armpit. Do not exceed 14 days 06/04/22  Yes Joylyn Duggin L, PA  albuterol (VENTOLIN HFA) 108 (90 Base) MCG/ACT inhaler Inhale 2 puffs into the lungs every 6 (six) hours as needed for wheezing or shortness of breath. 12/07/21   [name], FNP  amLODipine (NORVASC) 10 MG tablet TAKE 1 TABLET BY MOUTH EVERY DAY 05/14/22   [name], FNP  aspirin EC 81 MG EC tablet Take 1 tablet (81 mg total) by mouth daily. 07/24/19   Shelly Coss, MD  atorvastatin (LIPITOR) 40 MG tablet TAKE 1 TABLET BY MOUTH DAILY  AT 6 PM. 02/16/22   [name], FNP  benzonatate (TESSALON PERLES) 100 MG capsule Take 1 capsule (100 mg total) by mouth every 6 (six) hours as needed. 12/07/21 12/07/22  [name], FNP  calcitRIOL (ROCALTROL) 0.5 MCG capsule Take 0.5 mcg by mouth every morning.    [provider]  doxazosin (CARDURA) 4 MG tablet TAKE 1 TABLET BY MOUTH TWICE A DAY 01/08/22   [name], FNP  ferrous gluconate (FERGON) 240 (27 FE) MG tablet Take 240 mg by mouth daily.    [provider]  furosemide (LASIX) 80 MG tablet Take 80 mg by mouth 2 (two) times daily. 08/12/11   [provider]  hydrALAZINE (APRESOLINE) 25 MG tablet Take 1 tablet (25 mg total) by mouth 2 (two) times daily. 12/04/21 01/03/22  Antonieta Pert, MD  labetalol (NORMODYNE) 300 MG tablet Take 1 tablet (300 mg total) by mouth 2 (two) times daily. 01/08/22   [name], FNP  lidocaine-prilocaine (EMLA) cream Apply 1 application topically daily as needed (prior to port access). 11/28/21   [provider]  Misc Natural Products (SAW PALMETTO) CAPS Take 1 capsule by mouth daily.    [provider]  Multiple Vitamins-Minerals (MULTIVITAMIN WITH MINERALS) tablet Take 1 tablet by mouth daily.    [provider]  omeprazole (PRILOSEC) 20 MG capsule Take 20 mg by mouth daily. Patient not taking: Reported on 12/02/2021 07/23/11   [provider]    Family History Family History  Problem Relation Age of Onset  . Stroke Mother   . Healthy Father   . Colon cancer Neg Hx   . Colon polyps Neg Hx     Social History Social History   Tobacco Use  . Smoking status: Some Days    Packs/day: 0.25    Years: 50.00    Pack years: 12.50    Types: Cigarettes  . Smokeless tobacco: Never  . Tobacco comments:    3 cigaretts per day  Vaping Use  . Vaping Use: Never used  Substance Use Topics  . Alcohol use: Yes    Comment: occasional  . Drug use: Yes    Types: Marijuana    Comment:  last use was on 04/29/21     Allergies   Enalapril   Review of Systems Review of Systems As per hpi  Physical Exam Triage Vital Signs ED Triage Vitals  Enc Vitals Group     BP 06/04/22 1722 122/80     Pulse Rate 06/04/22 1722 64     Resp 06/04/22 1722 18     Temp 06/04/22 1722 98.3 F (36.8 C)     Temp Source 06/04/22 1722 Oral     SpO2 06/04/22 1722 97 %     Weight --      Height --      Head Circumference --      Peak Flow --      Pain Score 06/04/22 1719 0     Pain Loc --      Pain Edu? --      Excl. in [place]? --    No data found.  Updated Vital Signs BP 122/80 (BP Location: Left Arm)   Pulse 64   Temp 98.3 F (36.8 C) (Oral)   Resp 18   SpO2 97%   Visual Acuity Right Eye Distance:   Left Eye  Distance:   Bilateral Distance:    Right Eye Near:   Left Eye Near:    Bilateral  Near:     Physical Exam Vitals and nursing note reviewed.  Constitutional:      Appearance: Normal appearance. He is normal weight.  Neurological:     Mental Status: He is alert.     UC Treatments / Results  Labs (all labs ordered are listed, but only abnormal results are displayed) Labs Reviewed - No data to display  EKG   Radiology No results found.  Procedures Procedures (including critical care time)  Medications Ordered in UC Medications - No data to display  Initial Impression / Assessment and Plan / UC Course  I have reviewed the triage vital signs and the nursing notes.  Pertinent labs & imaging results that were available during my care of the patient were reviewed by me and considered in my medical decision making (see chart for details).     *** Final Clinical Impressions(s) / UC Diagnoses   Final diagnoses:  Tick bite of left upper arm, initial encounter  Allergic reaction to insect bite     Discharge Instructions      Start taking the antibiotic twice daily, do not stop taking it just because you feel better. Monitor for any adverse reactions. Do not take it within two hours of milk consumption, and avoid multivitamins while on the antibiotic. Please avoid excessive sun exposure or tanning beds while taking. Drink plenty of water.   Apply topical steroid cream to the affected area of the L armpit twice daily for a maximum of 14 days, or until itching and redness resolves. You are being treated with a full course of antibiotics that would cover for lyme disease. Please follow up with your PCP in 3-4 weeks   ED Prescriptions     Medication Sig Dispense Auth. Provider   doxycycline (VIBRAMYCIN) 100 MG capsule Take 1 capsule (100 mg total) by mouth 2 (two) times daily for 14 days. 28 capsule Aevah Stansbery L, PA   triamcinolone cream (KENALOG) 0.1 % Apply 1  application. topically 2 (two) times daily. To L armpit. Do not exceed 14 days 30 g Jeno Calleros L, PA      PDMP not reviewed this encounter.

## 2022-08-01 ENCOUNTER — Encounter (HOSPITAL_COMMUNITY): Payer: Self-pay

## 2022-08-15 ENCOUNTER — Ambulatory Visit: Payer: Medicare Other | Admitting: Nurse Practitioner

## 2022-08-21 ENCOUNTER — Ambulatory Visit: Payer: BC Managed Care – PPO | Admitting: Nurse Practitioner

## 2022-08-21 ENCOUNTER — Encounter: Payer: Self-pay | Admitting: Nurse Practitioner

## 2022-08-21 VITALS — BP 100/70 | HR 60 | Temp 98.6°F | Ht 71.0 in | Wt 190.6 lb

## 2022-08-21 DIAGNOSIS — Z1211 Encounter for screening for malignant neoplasm of colon: Secondary | ICD-10-CM

## 2022-08-21 DIAGNOSIS — I129 Hypertensive chronic kidney disease with stage 1 through stage 4 chronic kidney disease, or unspecified chronic kidney disease: Secondary | ICD-10-CM

## 2022-08-21 DIAGNOSIS — Z992 Dependence on renal dialysis: Secondary | ICD-10-CM

## 2022-08-21 DIAGNOSIS — R1900 Intra-abdominal and pelvic swelling, mass and lump, unspecified site: Secondary | ICD-10-CM

## 2022-08-21 DIAGNOSIS — N186 End stage renal disease: Secondary | ICD-10-CM | POA: Diagnosis not present

## 2022-08-21 DIAGNOSIS — Z23 Encounter for immunization: Secondary | ICD-10-CM | POA: Diagnosis not present

## 2022-08-21 DIAGNOSIS — I10 Essential (primary) hypertension: Secondary | ICD-10-CM

## 2022-08-21 NOTE — Patient Instructions (Signed)

## 2022-08-21 NOTE — Progress Notes (Signed)
I,Tianna Badgett,acting as a Education administrator for [employer], FNP.,have documented all relevant documentation on the behalf of [name], FNP,as directed by  [name], FNP while in the presence of [name], [place].  Subjective:     Patient ID: Ryan Salazar , male    DOB: 03-Sep-1951 , 71 y.o.   MRN: [id number]   Chief Complaint  Patient presents with   Abdominal Pain   Hernia    HPI  Patient presents today for abdominal discomfort. Patient thinks its a hernia and he states it moves and it makes noises. He states he will get up fast and feel bad pressure. He states its been about a month since he noticed it. He could feel the knot drop.   He was declined for renal transplant due to not having enough psychosocial support.   BP Readings from Last 3 Encounters: 08/21/22 : 100/70 06/04/22 : 122/80 12/07/21 : 104/78      Past Medical History:  Diagnosis Date   Anemia    Arthritis    Blood transfusion 1989   Chronic kidney disease    Dyspnea    on exertion - no oxygen   GERD (gastroesophageal reflux disease)    HLD (hyperlipidemia)    Hypertension    Sleep apnea    Stroke ([place]) 07/2019   no residual   Ulcer 1989   BLEEDING STOMACH ULCER     Family History  Problem Relation Age of Onset   Stroke Mother    Healthy Father    Colon cancer Neg Hx    Colon polyps Neg Hx      Current Outpatient Medications:    albuterol (VENTOLIN HFA) 108 (90 Base) MCG/ACT inhaler, Inhale 2 puffs into the lungs every 6 (six) hours as needed for wheezing or shortness of breath., Disp: 18 g, Rfl: 2   aspirin EC 81 MG EC tablet, Take 1 tablet (81 mg total) by mouth daily., Disp: 30 tablet, Rfl: 1   atorvastatin (LIPITOR) 40 MG tablet, TAKE 1 TABLET BY MOUTH DAILY AT 6 PM., Disp: 30 tablet, Rfl: 0   furosemide (LASIX) 80 MG tablet, Take 80 mg by mouth 2 (two) times daily., Disp: , Rfl:    labetalol (NORMODYNE) 300 MG tablet, Take 1 tablet (300 mg total) by mouth 2 (two) times daily., Disp: 90  tablet, Rfl: 1   lidocaine-prilocaine (EMLA) cream, Apply 1 application topically daily as needed (prior to port access)., Disp: , Rfl:    Misc Natural Products (SAW PALMETTO) CAPS, Take 1 capsule by mouth daily., Disp: , Rfl:    Multiple Vitamins-Minerals (MULTIVITAMIN WITH MINERALS) tablet, Take 1 tablet by mouth daily., Disp: , Rfl:    triamcinolone cream (KENALOG) 0.1 %, Apply 1 application. topically 2 (two) times daily. To L armpit. Do not exceed 14 days, Disp: 30 g, Rfl: 0   amLODipine (NORVASC) 10 MG tablet, TAKE 1 TABLET BY MOUTH EVERY DAY (Patient not taking: Reported on 08/21/2022), Disp: 90 tablet, Rfl: 0   benzonatate (TESSALON PERLES) 100 MG capsule, Take 1 capsule (100 mg total) by mouth every 6 (six) hours as needed. (Patient not taking: Reported on 08/21/2022), Disp: 30 capsule, Rfl: 1   calcitRIOL (ROCALTROL) 0.5 MCG capsule, Take 0.5 mcg by mouth every morning., Disp: , Rfl:    doxazosin (CARDURA) 4 MG tablet, TAKE 1 TABLET BY MOUTH TWICE A DAY (Patient not taking: Reported on 08/21/2022), Disp: 180 tablet, Rfl: 0   ferrous gluconate (FERGON) 240 (27 FE) MG tablet,  Take 240 mg by mouth daily. (Patient not taking: Reported on 08/21/2022), Disp: , Rfl:    hydrALAZINE (APRESOLINE) 25 MG tablet, Take 1 tablet (25 mg total) by mouth 2 (two) times daily., Disp: 60 tablet, Rfl: 0   omeprazole (PRILOSEC) 20 MG capsule, Take 20 mg by mouth daily. (Patient not taking: Reported on 12/02/2021), Disp: , Rfl:    Allergies  Allergen Reactions   Enalapril Swelling    Reported by [facility] - pt is not aware     Review of Systems  HENT: Negative.    Respiratory: Negative.    Genitourinary:        Bulging to right suprapubic area  Musculoskeletal: Negative.   Psychiatric/Behavioral: Negative.       Today's Vitals   08/21/22 1225  BP: 100/70  Pulse: 60  Temp: 98.6 F (37 C)  TempSrc: Oral  Weight: 190 lb 9.6 oz (86.5 kg)  Height: 5\' 11"  (1.803 m)  PainSc: 0-No pain   Body mass index  is 26.58 kg/m. Wt Readings from Last 3 Encounters:  08/21/22 190 lb 9.6 oz (86.5 kg)  12/07/21 185 lb 6.4 oz (84.1 kg)  12/04/21 191 lb 12.8 oz (87 kg)      Objective:  Physical Exam Vitals reviewed.  Constitutional:      General: He is not in acute distress.    Appearance: He is well-developed.  Cardiovascular:     Rate and Rhythm: Normal rate and regular rhythm.     Heart sounds: Normal heart sounds. No murmur heard. Abdominal:     General: Bowel sounds are normal.  Genitourinary:      Comments: Semi-soft bulge to right suprapubic area Skin:    General: Skin is warm and dry.  Neurological:     General: No focal deficit present.     Mental Status: He is alert.        Assessment And Plan:     1. Essential hypertension Comments: Blood pressure is controlled, continue current medications.   2. Pelvic mass in male Comments: Semi-soft bulge to right suprapubic area, will check pelvic ultrasound - US Pelvis Complete; Future  3. ESRD on dialysis [facility]) Will get copy of most recent labs. Overall doing well. He is planning to try to go to [facility] for [facility]. [facility] declined him for limited psychosocial support  4. Encounter for immunization Comments: Pneumonia 20 given in office - Pneumococcal conjugate vaccine 20-valent ([id number])  5. Encounter for screening colonoscopy According to USPTF Colorectal cancer Screening guidelines. Colonoscopy is recommended every 10 years, starting at age 65 years. Will refer to GI for colon cancer screening. - Ambulatory referral to Gastroenterology    Patient was given opportunity to ask questions. Patient verbalized understanding of the plan and was able to repeat key elements of the plan. All questions were answered to their satisfaction.  [name], FNP   I, [name], FNP, have reviewed all documentation for this visit. The documentation on 08/21/22 for the exam, diagnosis, procedures, and orders are all accurate  and complete.   IF YOU HAVE BEEN REFERRED TO A SPECIALIST, IT MAY TAKE 1-2 WEEKS TO SCHEDULE/PROCESS THE REFERRAL. IF YOU HAVE NOT HEARD FROM US/SPECIALIST IN TWO WEEKS, PLEASE GIVE [place] A CALL AT [phone].   THE PATIENT IS ENCOURAGED TO PRACTICE SOCIAL DISTANCING DUE TO THE COVID-19 PANDEMIC.

## 2022-08-23 ENCOUNTER — Ambulatory Visit
Admission: RE | Admit: 2022-08-23 | Discharge: 2022-08-23 | Disposition: A | Discharge status: Home / Self Care | Payer: BC Managed Care – PPO | Source: Ambulatory Visit | Attending: Nurse Practitioner | Admitting: Nurse Practitioner

## 2022-08-23 DIAGNOSIS — R1900 Intra-abdominal and pelvic swelling, mass and lump, unspecified site: Secondary | ICD-10-CM

## 2022-09-26 ENCOUNTER — Encounter: Payer: Self-pay | Admitting: Nurse Practitioner

## 2022-09-26 ENCOUNTER — Other Ambulatory Visit: Payer: Self-pay | Admitting: Nurse Practitioner

## 2022-09-26 DIAGNOSIS — K4090 Unilateral inguinal hernia, without obstruction or gangrene, not specified as recurrent: Secondary | ICD-10-CM

## 2022-10-24 ENCOUNTER — Encounter: Payer: Self-pay | Admitting: Nurse Practitioner

## 2022-11-12 NOTE — Progress Notes (Incomplete)
CARDIOLOGY CONSULT NOTE       Patient ID: Ryan Salazar MRN: [id number] DOB/AGE: 02-07-1951 71 y.o.  Admit date: (Not on file) Referring Physician: [place] Primary Physician: [name], FNP Primary Cardiologist: New Reason for Consultation: Preoperative Consult  Active Problems:   * No active hospital problems. *   HPI:  71 y.o. referred by Dr Thermon Leyland for preoperative cardiac evaluation Has right inguinal hernia surgery schedule for 12/14/22. He has CRF on dialysis HLD, HTN OSA Right arm fistula placed May 2022 Noticed painful bulging in right groin July He has no history of vascular dx, MI, PVD or aneurysm ? Distant history of CVA with no residual. TTE done 07/21/19 with EF 55-60% mild AV sclerosis   ***  ROS All other systems reviewed and negative except as noted above  Past Medical History:  Diagnosis Date  . Anemia   . Arthritis   . Blood transfusion 1989  . Chronic kidney disease   . Dyspnea    on exertion - no oxygen  . GERD (gastroesophageal reflux disease)   . HLD (hyperlipidemia)   . Hypertension   . Sleep apnea   . Stroke ([place]) 07/2019   no residual  . Ulcer 1989   BLEEDING STOMACH ULCER    Family History  Problem Relation Age of Onset  . Stroke Mother   . Healthy Father   . Colon cancer Neg Hx   . Colon polyps Neg Hx     Social History   Socioeconomic History  . Marital status: Single    Spouse name: Not on file  . Number of children: Not on file  . Years of education: Not on file  . Highest education level: Not on file  Occupational History  . Not on file  Tobacco Use  . Smoking status: Some Days    Packs/day: 0.25    Years: 50.00    Total pack years: 12.50    Types: Cigarettes  . Smokeless tobacco: Never  . Tobacco comments:    3 - 4 cigarettes per day states "not even a whole cigarette"  Vaping Use  . Vaping Use: Never used  Substance and Sexual Activity  . Alcohol use: Yes    Comment: occasional  . Drug use: Yes     Types: Marijuana    Comment:  last use was on 04/29/21  . Sexual activity: Not Currently  Other Topics Concern  . Not on file  Social History Narrative  . Not on file   Social Determinants of Health   Financial Resource Strain: Not on file  Food Insecurity: Not on file  Transportation Needs: Not on file  Physical Activity: Not on file  Stress: Not on file  Social Connections: Not on file  Intimate Partner Violence: Not on file    Past Surgical History:  Procedure Laterality Date  . AV FISTULA PLACEMENT Right 05/02/2021   Procedure: RIGHT ARM ARTERIOVENOUS (AV) FISTULA CREATION;  Surgeon: Waynetta Sandy, MD;  Location: [facility];  Service: Vascular;  Laterality: Right;  . COLONOSCOPY    . [place]  . TONSILLECTOMY  AS CHILD  . WISDOM TOOTH EXTRACTION        Current Outpatient Medications:  .  albuterol (VENTOLIN HFA) 108 (90 Base) MCG/ACT inhaler, Inhale 2 puffs into the lungs every 6 (six) hours as needed for wheezing or shortness of breath., Disp: 18 g, Rfl: 2 .  amLODipine (NORVASC) 10 MG tablet, TAKE 1 TABLET BY MOUTH EVERY  DAY (Patient not taking: Reported on 08/21/2022), Disp: 90 tablet, Rfl: 0 .  aspirin EC 81 MG EC tablet, Take 1 tablet (81 mg total) by mouth daily., Disp: 30 tablet, Rfl: 1 .  atorvastatin (LIPITOR) 40 MG tablet, TAKE 1 TABLET BY MOUTH DAILY AT 6 PM., Disp: 30 tablet, Rfl: 0 .  benzonatate (TESSALON PERLES) 100 MG capsule, Take 1 capsule (100 mg total) by mouth every 6 (six) hours as needed. (Patient not taking: Reported on 08/21/2022), Disp: 30 capsule, Rfl: 1 .  calcitRIOL (ROCALTROL) 0.5 MCG capsule, Take 0.5 mcg by mouth every morning., Disp: , Rfl:  .  doxazosin (CARDURA) 4 MG tablet, TAKE 1 TABLET BY MOUTH TWICE A DAY (Patient not taking: Reported on 08/21/2022), Disp: 180 tablet, Rfl: 0 .  ferrous gluconate (FERGON) 240 (27 FE) MG tablet, Take 240 mg by mouth daily. (Patient not taking: Reported on 08/21/2022), Disp: , Rfl:  .   furosemide (LASIX) 80 MG tablet, Take 80 mg by mouth 2 (two) times daily., Disp: , Rfl:  .  hydrALAZINE (APRESOLINE) 25 MG tablet, Take 1 tablet (25 mg total) by mouth 2 (two) times daily., Disp: 60 tablet, Rfl: 0 .  labetalol (NORMODYNE) 300 MG tablet, Take 1 tablet (300 mg total) by mouth 2 (two) times daily., Disp: 90 tablet, Rfl: 1 .  lidocaine-prilocaine (EMLA) cream, Apply 1 application topically daily as needed (prior to port access)., Disp: , Rfl:  .  Misc Natural Products (SAW PALMETTO) CAPS, Take 1 capsule by mouth daily., Disp: , Rfl:  .  Multiple Vitamins-Minerals (MULTIVITAMIN WITH MINERALS) tablet, Take 1 tablet by mouth daily., Disp: , Rfl:  .  omeprazole (PRILOSEC) 20 MG capsule, Take 20 mg by mouth daily. (Patient not taking: Reported on 12/02/2021), Disp: , Rfl:  .  triamcinolone cream (KENALOG) 0.1 %, Apply 1 application. topically 2 (two) times daily. To L armpit. Do not exceed 14 days, Disp: 30 g, Rfl: 0    Physical Exam: There were no vitals taken for this visit.    Affect appropriate Chronically ill black male  HEENT: normal Neck supple with no adenopathy JVP normal no bruits no thyromegaly Lungs clear with no wheezing and good diaphragmatic motion Heart:  S1/S2 no murmur, no rub, gallop or click PMI normal Abdomen: benighn, BS positve, no tenderness, no AAA no bruit.  No HSM or HJR Distal pulses intact with no bruits No edema Neuro non-focal Skin warm and dry RUE fistula with good thrill    Labs:   Lab Results  Component Value Date   WBC 8.9 12/07/2021   HGB 8.7 (L) 12/07/2021   HCT 27.6 (L) 12/07/2021   MCV 84 12/07/2021   PLT 284 12/07/2021   No results for input(s): "NA", "K", "CL", "CO2", "BUN", "CREATININE", "CALCIUM", "PROT", "BILITOT", "ALKPHOS", "ALT", "AST", "GLUCOSE" in the last 168 hours.  Invalid input(s): "LABALBU" No results found for: "CKTOTAL", "CKMB", "CKMBINDEX", "TROPONINI"  Lab Results  Component Value Date   CHOL 75 (L)  06/07/2021   CHOL 99 (L) 12/07/2020   CHOL 95 (L) 07/07/2020   Lab Results  Component Value Date   HDL 32 (L) 06/07/2021   HDL 37 (L) 12/07/2020   HDL 32 (L) 07/07/2020   Lab Results  Component Value Date   LDLCALC 28 06/07/2021   LDLCALC 43 12/07/2020   LDLCALC 39 07/07/2020   Lab Results  Component Value Date   TRIG 65 06/07/2021   TRIG 98 12/07/2020   TRIG 139 07/07/2020  Lab Results  Component Value Date   CHOLHDL 2.3 06/07/2021   CHOLHDL 2.7 12/07/2020   CHOLHDL 3.0 07/07/2020   No results found for: "LDLDIRECT"    Radiology: No results found.  EKG: 12/05/21 SR isolated PVC otherwise normal    ASSESSMENT AND PLAN:   Preoperative:  able to do 5 METS, no history of CAD *** CRF:  continue dialysis via RUE fistula turned down for transplant in past due to lack of psychosocial support HTN;  Well controlled.  Continue current medications and low sodium Dash type diet.   Smoking:  no active wheezing Has ventolin inhaler  Anemia: related to renal failure on Iron  HLD:  continue statin    TTE ***  F/U cardiology PRN pending test results   Signed: Jenkins Rouge 11/12/2022, 2:12 PM

## 2022-11-20 ENCOUNTER — Ambulatory Visit: Payer: BC Managed Care – PPO | Admitting: Cardiovascular Disease

## 2022-11-26 NOTE — Progress Notes (Unsigned)
Cardiology Office Note:    Date:  11/27/2022   ID:  CHANDRA FEGER, DOB 1951/01/30, MRN [id number]  PCP:  [name], [facility] Cardiologist:  Janina Mayo, MD     Referring MD: Thermon Leyland Nickola Major, MD   No chief complaint on file. Pre-Op Eval  History of Present Illness:    Ryan Salazar is a 71 y.o. male with a hx of inguinal hernia , stroke 07/21/2019 ( acute ischemia of the dorsal R pons; with L sided weakness), ESRD on I[facility] MWF, referral for pre-op evaluation  Hx of HTN. Sleep apnea was diagnosed, did not tolerate the CPAP.   Hx of stroke  07/21/2019 . Had an echo 07/21/2019. EF is normal, moderate LVH, RV fxn is normal, no significant valve dx. No heart disease hx. He had a cardiac stress test a long time ago.  Nl carotids.  No exercise. He mows the grass. He got a new bicycle. No CP/SOB with stairs. No orthopnea/PND. No LE edema. No palpitations or syncope  Past Medical History:  Diagnosis Date   Anemia    Arthritis    Blood transfusion 1989   Chronic kidney disease    Dyspnea    on exertion - no oxygen   GERD (gastroesophageal reflux disease)    HLD (hyperlipidemia)    Hypertension    Sleep apnea    Stroke ([place]) 07/2019   no residual   Ulcer 1989   BLEEDING STOMACH ULCER    Past Surgical History:  Procedure Laterality Date   AV FISTULA PLACEMENT Right 05/02/2021   Procedure: RIGHT ARM ARTERIOVENOUS (AV) FISTULA CREATION;  Surgeon: Waynetta Sandy, MD;  Location: [facility];  Service: Vascular;  Laterality: Right;   COLONOSCOPY     INGUINAL HERNIA REPAIR  1973   TONSILLECTOMY  AS CHILD   WISDOM TOOTH EXTRACTION      Current Medications: Current Outpatient Medications on File Prior to Visit  Medication Sig Dispense Refill   aspirin EC 81 MG EC tablet Take 1 tablet (81 mg total) by mouth daily. 30 tablet 1   atorvastatin (LIPITOR) 40 MG tablet TAKE 1 TABLET BY MOUTH DAILY AT 6 PM. 30 tablet 0   calcitRIOL (ROCALTROL) 0.5  MCG capsule Take 0.5 mcg by mouth every morning.     furosemide (LASIX) 80 MG tablet Take 80 mg by mouth 2 (two) times daily.     labetalol (NORMODYNE) 300 MG tablet Take 1 tablet (300 mg total) by mouth 2 (two) times daily. 90 tablet 1   omeprazole (PRILOSEC) 20 MG capsule Take 20 mg by mouth daily.     benzonatate (TESSALON PERLES) 100 MG capsule Take 1 capsule (100 mg total) by mouth every 6 (six) hours as needed. (Patient not taking: Reported on 08/21/2022) 30 capsule 1   doxazosin (CARDURA) 4 MG tablet TAKE 1 TABLET BY MOUTH TWICE A DAY (Patient not taking: Reported on 08/21/2022) 180 tablet 0   ferrous gluconate (FERGON) 240 (27 FE) MG tablet Take 240 mg by mouth daily. (Patient not taking: Reported on 08/21/2022)     hydrALAZINE (APRESOLINE) 25 MG tablet Take 1 tablet (25 mg total) by mouth 2 (two) times daily. (Patient not taking: Reported on 11/27/2022) 60 tablet 0   lidocaine-prilocaine (EMLA) cream Apply 1 application topically daily as needed (prior to port access). (Patient not taking: Reported on 11/27/2022)     Misc Natural Products (SAW PALMETTO) CAPS Take 1 capsule by mouth daily. (Patient  not taking: Reported on 11/27/2022)     Multiple Vitamins-Minerals (MULTIVITAMIN WITH MINERALS) tablet Take 1 tablet by mouth daily. (Patient not taking: Reported on 11/27/2022)     triamcinolone cream (KENALOG) 0.1 % Apply 1 application. topically 2 (two) times daily. To L armpit. Do not exceed 14 days (Patient not taking: Reported on 11/27/2022) 30 g 0   No current facility-administered medications on file prior to visit.     Allergies:   Enalapril   Social History   Socioeconomic History   Marital status: Single    Spouse name: Not on file   Number of children: Not on file   Years of education: Not on file   Highest education level: Not on file  Occupational History   Not on file  Tobacco Use   Smoking status: Some Days    Packs/day: 0.25    Years: 50.00    Total pack years: 12.50     Types: Cigarettes   Smokeless tobacco: Never   Tobacco comments:    3 - 4 cigarettes per day states "not even a whole cigarette"  Vaping Use   Vaping Use: Never used  Substance and Sexual Activity   Alcohol use: Yes    Comment: occasional   Drug use: Yes    Types: Marijuana    Comment:  last use was on 04/29/21   Sexual activity: Not Currently  Other Topics Concern   Not on file  Social History Narrative   Not on file   Social Determinants of Health   Financial Resource Strain: Not on file  Food Insecurity: Not on file  Transportation Needs: Not on file  Physical Activity: Not on file  Stress: Not on file  Social Connections: Not on file     Family History: The patient's family history includes Healthy in his father; Stroke in his mother. There is no history of Colon cancer or Colon polyps.  ROS:   Please see the history of present illness.     All other systems reviewed and are negative.  EKGs/Labs/Other Studies Reviewed:    The following studies were reviewed today:   EKG:  EKG is  ordered today.  The ekg ordered today demonstrates   11/27/2022- NSR with PACs, minimal LVH  Recent Labs: 12/03/2021: ALT 35; Magnesium 1.6 12/07/2021: BUN 64; Creatinine, Ser 6.42; Hemoglobin 8.7; Platelets 284; Potassium 4.4; Sodium 145   Recent Lipid Panel    Component Value Date/Time   CHOL 75 (L) 06/07/2021 1611   TRIG 65 06/07/2021 1611   HDL 32 (L) 06/07/2021 1611   CHOLHDL 2.3 06/07/2021 1611   CHOLHDL 5.6 07/21/2019 0436   VLDL 37 07/21/2019 0436   LDLCALC 28 06/07/2021 1611     Risk Assessment/Calculations:         Physical Exam:    VS:   Vitals:   11/27/22 1001  BP: 112/72  Pulse: 74  SpO2: 96%     Wt Readings from Last 3 Encounters:  11/27/22 186 lb 3.2 oz (84.5 kg)  08/21/22 190 lb 9.6 oz (86.5 kg)  12/07/21 185 lb 6.4 oz (84.1 kg)     GEN:  Well nourished, well developed in no acute distress HEENT: Normal NECK: No JVD; No carotid  bruits LYMPHATICS: No lymphadenopathy CARDIAC: RRR, no murmurs, rubs, gallops RESPIRATORY:  Clear to auscultation without rales, wheezing or rhonchi  ABDOMEN: Soft, non-tender, non-distended MUSCULOSKELETAL:  No edema; No deformity  SKIN: Warm and dry NEUROLOGIC:  Alert and oriented x 3 PSYCHIATRIC:  Normal affect   ASSESSMENT:    Pre-Op: He can walk > 4 METS without shortness of breath or chest pressure. He  is intermediate risk for surgery. RCRI Class II risk, 6% risk of adverse cardiac event post surgery. He is acceptable cardiac risk for BL inguinal surgery/general anesthesia.  HTN: well controlled. Continue current meds   PLAN:    In order of problems listed above:   He is acceptable cardiac risk for BL inguinal surgery/general anesthesia.           Medication Adjustments/Labs and Tests Ordered: Current medicines are reviewed at length with the patient today.  Concerns regarding medicines are outlined above.  Orders Placed This Encounter  Procedures   EKG 12-Lead   No orders of the defined types were placed in this encounter.   Patient Instructions  Medication Instructions:  No Changes In Medications at this time.  *If you need a refill on your cardiac medications before your next appointment, please call your pharmacy*  Lab Work: None Ordered At This Time.  If you have labs (blood work) drawn today and your tests are completely normal, you will receive your results only by: [facility] (if you have MyChart) OR A paper copy in the mail If you have any lab test that is abnormal or we need to change your treatment, we will call you to review the results.  Testing/Procedures: None Ordered At This Time.   Follow-Up: At [facility], you and your health needs are our priority.  As part of our continuing mission to provide you with exceptional heart care, we have created designated Provider Care Teams.  These Care Teams include your primary  Cardiologist (physician) and Advanced Practice Providers (APPs -  Physician Assistants and Nurse Practitioners) who all work together to provide you with the care you need, when you need it.  Your next appointment:   AS NEEDED   The format for your next appointment:   In Person  Provider:   Janina Mayo, MD             Signed, Janina Mayo, MD  11/27/2022 10:48 AM    [place]

## 2022-11-27 ENCOUNTER — Encounter: Payer: Self-pay | Admitting: Internal Medicine

## 2022-11-27 ENCOUNTER — Ambulatory Visit: Payer: BC Managed Care – PPO | Attending: Cardiovascular Disease | Admitting: Internal Medicine

## 2022-11-27 VITALS — BP 112/72 | HR 74 | Ht 71.0 in | Wt 186.2 lb

## 2022-11-27 DIAGNOSIS — Z01810 Encounter for preprocedural cardiovascular examination: Secondary | ICD-10-CM

## 2022-11-27 NOTE — Patient Instructions (Signed)

## 2022-11-30 ENCOUNTER — Ambulatory Visit: Payer: Self-pay | Admitting: Surgery

## 2022-12-05 NOTE — Pre-Procedure Instructions (Signed)
Surgical Instructions    Your procedure is scheduled on Friday, December 15.  Report to [facility] Main Entrance "A" at 5:30 A.M., then check in with the Admitting office.  Call this number if you have problems the morning of surgery:  [phone]   If you have any questions prior to your surgery date call [phone]: Open Monday-Friday 8am-4pm If you experience any cold or flu symptoms such as cough, fever, chills, shortness of breath, etc. between now and your scheduled surgery, please notify us at the above number     Remember:  Do not eat after midnight the night before your surgery  You may drink clear liquids until 4:15AM the morning of your surgery.   Clear liquids allowed are: Water, Non-Citrus Juices (without pulp), Carbonated Beverages, Clear Tea, Black Coffee ONLY (NO MILK, CREAM OR POWDERED CREAMER of any kind), and Gatorade  Patient Instructions  The night before surgery:  No food after midnight. ONLY clear liquids after midnight  The day of surgery (if you do NOT have diabetes):  Drink ONE (1) Pre-Surgery Clear Ensure by 4:15AM the morning of surgery. Drink in one sitting. Do not sip.  This drink was given to you during your hospital  pre-op appointment visit.  Nothing else to drink after completing the  Pre-Surgery Clear Ensure.          If you have questions, please contact your surgeon's office.     Take these medicines the morning of surgery with A SIP OF WATER:  labetalol (NORMODYNE)  omeprazole (PRILOSEC)  Follow your surgeon's instructions on when to stop Aspirin.  If no instructions were given by your surgeon then you will need to call the office to get those instructions.    As of today, STOP taking any Aleve, Naproxen, Ibuprofen, Motrin, Advil, Goody's, BC's, all herbal medications, fish oil, and all vitamins.            [facility] is not responsible for any belongings or valuables.    Do NOT Smoke (Tobacco/Vaping)  24 hours prior to your  procedure  If you use a CPAP at night, you may bring your mask for your overnight stay.   Contacts, glasses, hearing aids, dentures or partials may not be worn into surgery, please bring cases for these belongings   For patients admitted to the hospital, discharge time will be determined by your treatment team.   Patients discharged the day of surgery will not be allowed to drive home, and someone needs to stay with them for 24 hours.   SURGICAL WAITING ROOM VISITATION Patients having surgery or a procedure may have no more than 2 support people in the waiting area - these visitors may rotate.   Children under the age of 45 must have an adult with them who is not the patient. If the patient needs to stay at the hospital during part of their recovery, the visitor guidelines for inpatient rooms apply. Pre-op nurse will coordinate an appropriate time for 1 support person to accompany patient in pre-op.  This support person may not rotate.   Please refer to [email or web address] for the visitor guidelines for Inpatients (after your surgery is over and you are in a regular room).    Special instructions:    Oral Hygiene is also important to reduce your risk of infection.  Remember - BRUSH YOUR TEETH THE MORNING OF SURGERY WITH YOUR REGULAR TOOTHPASTE   [facility]- Preparing For Surgery  Before surgery, you can play an important  role. Because skin is not sterile, your skin needs to be as free of germs as possible. You can reduce the number of germs on your skin by washing with CHG (chlorahexidine gluconate) Soap before surgery.  CHG is an antiseptic cleaner which kills germs and bonds with the skin to continue killing germs even after washing.     Please do not use if you have an allergy to CHG or antibacterial soaps. If your skin becomes reddened/irritated stop using the CHG.  Do not shave (including legs and underarms) for at least 48 hours  prior to first CHG shower. It is OK to shave your face.  Please follow these instructions carefully.     Shower the NIGHT BEFORE SURGERY and the MORNING OF SURGERY with CHG Soap.   If you chose to wash your hair, wash your hair first as usual with your normal shampoo. After you shampoo, rinse your hair and body thoroughly to remove the shampoo.  Then [employer] and genitals (private parts) with your normal soap and rinse thoroughly to remove soap.  After that Use CHG Soap as you would any other liquid soap. You can apply CHG directly to the skin and wash gently with a scrungie or a clean washcloth.   Apply the CHG Soap to your body ONLY FROM THE NECK DOWN.  Do not use on open wounds or open sores. Avoid contact with your eyes, ears, mouth and genitals (private parts). Wash Face and genitals (private parts)  with your normal soap.   Wash thoroughly, paying special attention to the area where your surgery will be performed.  Thoroughly rinse your body with warm water from the neck down.  DO NOT shower/wash with your normal soap after using and rinsing off the CHG Soap.  Pat yourself dry with a CLEAN TOWEL.  Wear CLEAN PAJAMAS to bed the night before surgery  Place CLEAN SHEETS on your bed the night before your surgery  DO NOT SLEEP WITH PETS.   Day of Surgery:  Take a shower with CHG soap. Wear Clean/Comfortable clothing the morning of surgery Do not wear jewelry or makeup. Do not wear lotions, powders, perfumes/cologne or deodorant. Do not shave 48 hours prior to surgery.  Men may shave face and neck. Do not bring valuables to the hospital. Do not wear nail polish, gel polish, artificial nails, or any other type of covering on natural nails (fingers and toes) If you have artificial nails or gel coating that need to be removed by a nail salon, please have this removed prior to surgery. Artificial nails or gel coating may interfere with anesthesia's ability to adequately monitor your  vital signs. Remember to brush your teeth WITH YOUR REGULAR TOOTHPASTE.    If you received a COVID test during your pre-op visit, it is requested that you wear a mask when out in public, stay away from anyone that may not be feeling well, and notify your surgeon if you develop symptoms. If you have been in contact with anyone that has tested positive in the last 10 days, please notify your surgeon.    Please read over the following fact sheets that you were given.

## 2022-12-06 ENCOUNTER — Inpatient Hospital Stay (HOSPITAL_COMMUNITY)
Admission: RE | Admit: 2022-12-06 | Discharge: 2022-12-06 | Disposition: A | Discharge status: Home / Self Care | Payer: BC Managed Care – PPO | Source: Ambulatory Visit

## 2022-12-11 ENCOUNTER — Encounter (HOSPITAL_COMMUNITY): Payer: Self-pay

## 2022-12-11 ENCOUNTER — Encounter: Payer: Medicare Other | Admitting: Nurse Practitioner

## 2022-12-11 ENCOUNTER — Encounter (HOSPITAL_COMMUNITY)
Admission: RE | Admit: 2022-12-11 | Discharge: 2022-12-11 | Disposition: A | Discharge status: Home / Self Care | Payer: BC Managed Care – PPO | Source: Ambulatory Visit | Attending: Surgery | Admitting: Surgery

## 2022-12-11 ENCOUNTER — Other Ambulatory Visit: Payer: Self-pay

## 2022-12-11 VITALS — BP 168/95 | HR 70 | Temp 98.6°F | Resp 18 | Ht 71.0 in | Wt 195.1 lb

## 2022-12-11 DIAGNOSIS — I120 Hypertensive chronic kidney disease with stage 5 chronic kidney disease or end stage renal disease: Secondary | ICD-10-CM | POA: Diagnosis not present

## 2022-12-11 DIAGNOSIS — F1290 Cannabis use, unspecified, uncomplicated: Secondary | ICD-10-CM | POA: Diagnosis not present

## 2022-12-11 DIAGNOSIS — K219 Gastro-esophageal reflux disease without esophagitis: Secondary | ICD-10-CM | POA: Diagnosis not present

## 2022-12-11 DIAGNOSIS — Z8673 Personal history of transient ischemic attack (TIA), and cerebral infarction without residual deficits: Secondary | ICD-10-CM | POA: Diagnosis not present

## 2022-12-11 DIAGNOSIS — Z01818 Encounter for other preprocedural examination: Secondary | ICD-10-CM

## 2022-12-11 DIAGNOSIS — Z01812 Encounter for preprocedural laboratory examination: Secondary | ICD-10-CM | POA: Insufficient documentation

## 2022-12-11 DIAGNOSIS — F17210 Nicotine dependence, cigarettes, uncomplicated: Secondary | ICD-10-CM | POA: Diagnosis not present

## 2022-12-11 DIAGNOSIS — E1122 Type 2 diabetes mellitus with diabetic chronic kidney disease: Secondary | ICD-10-CM | POA: Diagnosis not present

## 2022-12-11 DIAGNOSIS — Z823 Family history of stroke: Secondary | ICD-10-CM | POA: Diagnosis not present

## 2022-12-11 DIAGNOSIS — I10 Essential (primary) hypertension: Secondary | ICD-10-CM | POA: Insufficient documentation

## 2022-12-11 DIAGNOSIS — K4021 Bilateral inguinal hernia, without obstruction or gangrene, recurrent: Secondary | ICD-10-CM | POA: Diagnosis present

## 2022-12-11 DIAGNOSIS — Z992 Dependence on renal dialysis: Secondary | ICD-10-CM | POA: Diagnosis not present

## 2022-12-11 DIAGNOSIS — N186 End stage renal disease: Secondary | ICD-10-CM | POA: Diagnosis not present

## 2022-12-11 LAB — CBC
HCT: 35 % — ABNORMAL LOW (ref 39.0–52.0)
Hemoglobin: 10.7 g/dL — ABNORMAL LOW (ref 13.0–17.0)
MCH: 28 pg (ref 26.0–34.0)
MCHC: 30.6 g/dL (ref 30.0–36.0)
MCV: 91.6 fL (ref 80.0–100.0)
Platelets: 187 10*3/uL (ref 150–400)
RBC: 3.82 MIL/uL — ABNORMAL LOW (ref 4.22–5.81)
RDW: 16.2 % — ABNORMAL HIGH (ref 11.5–15.5)
WBC: 6.4 10*3/uL (ref 4.0–10.5)
nRBC: 0 % (ref 0.0–0.2)

## 2022-12-11 LAB — BASIC METABOLIC PANEL
Anion gap: 11 (ref 5–15)
BUN: 30 mg/dL — ABNORMAL HIGH (ref 8–23)
CO2: 32 mmol/L (ref 22–32)
Calcium: 9.2 mg/dL (ref 8.9–10.3)
Chloride: 98 mmol/L (ref 98–111)
Creatinine, Ser: 7.94 mg/dL — ABNORMAL HIGH (ref 0.61–1.24)
GFR, Estimated: 7 mL/min — ABNORMAL LOW (ref >60)
Glucose, Bld: 81 mg/dL (ref 70–99)
Potassium: 4.5 mmol/L (ref 3.5–5.1)
Sodium: 141 mmol/L (ref 135–145)

## 2022-12-11 NOTE — Progress Notes (Signed)
Cardiologist - Dr Phineas Inches Internal Med/PCP - [name], FNP  Chest x-ray - 12/01/21 EKG - 11/27/22 Stress Test - Long time ago per patient ECHO - 07/21/19 Cardiac Cath - n/a  ICD Pacemaker/Loop - n/a  Sleep Study -  Yes, did not complete sleep study test. CPAP - n/a  Aspirin Instructions: Follow your surgeon's instructions on when to stop aspirin prior to surgery,  If no instructions were given by your surgeon then you will need to call the office for those instructions.  ERAS: Clear liquids til 4:15 am DOS    Please complete your PRE-SURGERY ENSURE that was provided to you by 4:15 am on the morning of surgery.  Please, if able, drink it in one setting. DO NOT SIP.  Anesthesia review: Yes   STOP now taking any Aspirin (unless otherwise instructed by your surgeon), Aleve, Naproxen, Ibuprofen, Motrin, Advil, Goody's, BC's, all herbal medications, fish oil, and all vitamins.   Coronavirus Screening Do you have any of the following symptoms:  Cough yes/no: No Fever (>100.46F)  yes/no: No Runny nose yes/no: No Sore throat yes/no: No Difficulty breathing/shortness of breath  yes/no: No  Have you traveled in the last 14 days and where? yes/no: No  Patient verbalized understanding of instructions that were given to them at the PAT appointment. Patient was also instructed that they will need to review over the PAT instructions again at home before surgery.

## 2022-12-11 NOTE — Progress Notes (Deleted)
Subjective:     Patient ID: Ryan Salazar , male    DOB: 04/05/1951 , 71 y.o.   MRN: [id number]   Chief Complaint  Patient presents with   Hypertension    HPI  Patient presents today for bpc. He denies headache, SOB, blurred vision & chest pain.  He has LAPAROSCOPIC BILATERAL INGUINAL HERNIA REPAIR WITH MESH procedure coming up on 12/15.     Hypertension     Past Medical History:  Diagnosis Date   Anemia    Arthritis    Blood transfusion 1989   Chronic kidney disease    Dyspnea    on exertion - no oxygen   GERD (gastroesophageal reflux disease)    HLD (hyperlipidemia)    Hypertension    Sleep apnea    Stroke ([place]) 07/2019   no residual   Ulcer 1989   BLEEDING STOMACH ULCER     Family History  Problem Relation Age of Onset   Stroke Mother    Healthy Father    Colon cancer Neg Hx    Colon polyps Neg Hx      Current Outpatient Medications:    aspirin EC 81 MG EC tablet, Take 1 tablet (81 mg total) by mouth daily., Disp: 30 tablet, Rfl: 1   atorvastatin (LIPITOR) 40 MG tablet, TAKE 1 TABLET BY MOUTH DAILY AT 6 PM., Disp: 30 tablet, Rfl: 0   calcitRIOL (ROCALTROL) 0.5 MCG capsule, Take 0.5 mcg by mouth every Monday, Wednesday, and Friday with hemodialysis., Disp: , Rfl:    labetalol (NORMODYNE) 300 MG tablet, Take 1 tablet (300 mg total) by mouth 2 (two) times daily. (Patient taking differently: Take 300 mg by mouth See admin instructions. 300 mg by mouth twice daily EXCEPT days of Dialysis, MWF, Take 300 mg in the evening), Disp: 90 tablet, Rfl: 1   lidocaine-prilocaine (EMLA) cream, Apply 1 application  topically daily as needed (prior to port access)., Disp: , Rfl:    Misc Natural Products (SAW PALMETTO) CAPS, Take 1 capsule by mouth daily., Disp: , Rfl:    Multiple Vitamins-Minerals (MULTIVITAMIN WITH MINERALS) tablet, Take 1 tablet by mouth daily., Disp: , Rfl:    omeprazole (PRILOSEC) 20 MG capsule, Take 20 mg by mouth daily., Disp: , Rfl:    sevelamer  carbonate (RENVELA) 800 MG tablet, Take 800 mg by mouth 2 (two) times daily with a meal. If having 3rd meal, will take 1 tablet, Disp: , Rfl:    Allergies  Allergen Reactions   Enalapril Swelling    Reported by [facility] - pt is not aware     Review of Systems  Constitutional: Negative.   HENT: Negative.    Respiratory: Negative.    Gastrointestinal: Negative.   Skin: Negative.   Allergic/Immunologic: Negative.   Neurological: Negative.   Psychiatric/Behavioral: Negative.       There were no vitals filed for this visit. There is no height or weight on file to calculate BMI.   Objective:  Physical Exam      Assessment And Plan:     1. Essential hypertension  2. ESRD on dialysis ([place])  3. Non-recurrent unilateral inguinal hernia without obstruction or gangrene     Patient was given opportunity to ask questions. Patient verbalized understanding of the plan and was able to repeat key elements of the plan. All questions were answered to their satisfaction.  Debbora Dus, CMA   I, Debbora Dus, CMA, have reviewed all documentation for this visit.  The documentation on 12/11/22 for the exam, diagnosis, procedures, and orders are all accurate and complete.   IF YOU HAVE BEEN REFERRED TO A SPECIALIST, IT MAY TAKE 1-2 WEEKS TO SCHEDULE/PROCESS THE REFERRAL. IF YOU HAVE NOT HEARD FROM US/SPECIALIST IN TWO WEEKS, PLEASE GIVE [place] A CALL AT [phone].   THE PATIENT IS ENCOURAGED TO PRACTICE SOCIAL DISTANCING DUE TO THE COVID-19 PANDEMIC.

## 2022-12-12 NOTE — Progress Notes (Signed)
Anesthesia Chart Review:  History of CVA 07/21/2019 (acute ischemia of the dorsal right pons; with left-sided weakness), HTN, OSA intolerant to CPAP.  Recent stress test January 2023 as part of renal transplant workup showed normal myocardial perfusion.  TTE January 2023 showed EF 55 to 60%, normal wall motion, no significant valvular abnormalities.  Patient was seen by cardiologist Phineas Inches 11/27/2022 for preop evaluation.  Per note, "Pre-Op: He can walk > 4 METS without shortness of breath or chest pressure. He  is intermediate risk for surgery. RCRI Class II risk, 6% risk of adverse cardiac event post surgery. He is acceptable cardiac risk for BL inguinal surgery/general anesthesia."  ESRD on HD.  Preop labs reviewed, stable chronic anemia with hemoglobin 10.7, creatinine elevated consistent with history of ESRD, otherwise unremarkable.  EKG 11/27/2022: Sinus rhythm with premature atrial complexes.  Rate 74.  Minimal voltage criteria for LVH, may be normal variant.  Nuclear stress 01/16/2022 (Care Everywhere): 1.) LIMITATIONS: None.  2.) MYOCARDIAL PERFUSION: Normal.  3.) LEFT VENTRICULAR EJECTION FRACTION: 43% with rest, 48% with stress.  4.) REGIONAL WALL MOTION: Normal.   TTE 01/16/2022 (Care Everywhere): SUMMARY  The left ventricular size is normal.  There is mild concentric left ventricular hypertrophy.  LV ejection fraction = 55-60%.  Left ventricular systolic function is normal.  No segmental wall motion abnormalities seen in the left ventricle  The left atrium is mildly dilated.  Mild pulmonic valvular regurgitation.  IVC size was normal.  There is trivial pericardial effusion.  There is no comparison study available.   Carotid duplex 07/21/2019: Summary:  Right Carotid: Velocities in the right ICA are consistent with a 1-39% stenosis.   Left Carotid: Velocities in the left ICA are consistent with a 1-39% stenosis.   Vertebrals: Bilateral vertebral arteries demonstrate  antegrade flow.    Ryan Salazar [facility] Short Stay Center/Anesthesiology Phone [phone] 12/12/2022 11:56 AM

## 2022-12-13 NOTE — Anesthesia Preprocedure Evaluation (Addendum)
Anesthesia Evaluation  Patient identified by MRN, date of birth, ID band Patient awake    Reviewed: Allergy & Precautions, NPO status , Patient's Chart, lab work & pertinent test results  Airway Mallampati: II  TM Distance: >3 FB Neck ROM: Full    Dental no notable dental hx. (+) Partial Lower, Partial Upper   Pulmonary Current Smoker and Patient abstained from smoking.   Pulmonary exam normal breath sounds clear to auscultation       Cardiovascular hypertension, Normal cardiovascular exam Rhythm:Regular Rate:Normal  07/2019 TTE  1. The left ventricle has normal systolic function, with an ejection  fraction of 55-60%. The cavity size was normal. There is moderately  increased left ventricular wall thickness. Left ventricular diastolic  Doppler parameters are consistent with  pseudonormalization. No evidence of left ventricular regional wall motion  abnormalities.     Neuro/Psych CVA, No Residual Symptoms    GI/Hepatic ,GERD  ,,  Endo/Other    Renal/GU ESRF, CRF and DialysisRenal diseaseMWF dialysis  Lab Results      Component                Value               Date                      CREATININE               7.94 (H)            12/11/2022                BUN                      30 (H)              12/11/2022                NA                       141                 12/11/2022                K                        4.5                 12/11/2022                CL                       98                  12/11/2022                CO2                      32                  12/11/2022                Musculoskeletal  (+) Arthritis ,    Abdominal   Peds  Hematology  (+) Blood dyscrasia, anemia Lab Results      Component                Value  Date                      WBC                      6.4                 12/11/2022                HGB                      10.7 (L)            12/11/2022                 HCT                      35.0 (L)            12/11/2022                MCV                      91.6                12/11/2022                PLT                      187                 12/11/2022              Anesthesia Other Findings All: enalapril  Reproductive/Obstetrics                             Anesthesia Physical Anesthesia Plan  ASA: 3  Anesthesia Plan: General   Post-op Pain Management: Tylenol PO (pre-op)* and Precedex   Induction: Intravenous  PONV Risk Score and Plan: 2 and Treatment may vary due to age or medical condition and Ondansetron  Airway Management Planned: Oral ETT  Additional Equipment: None  Intra-op Plan:   Post-operative Plan: Extubation in OR  Informed Consent: I have reviewed the patients History and Physical, chart, labs and discussed the procedure including the risks, benefits and alternatives for the proposed anesthesia with the patient or authorized representative who has indicated his/her understanding and acceptance.     Dental advisory given  Plan Discussed with: CRNA, Anesthesiologist and Surgeon  Anesthesia Plan Comments:        Anesthesia Quick Evaluation

## 2022-12-14 ENCOUNTER — Other Ambulatory Visit: Payer: Self-pay

## 2022-12-14 ENCOUNTER — Ambulatory Visit (HOSPITAL_COMMUNITY): Payer: BC Managed Care – PPO | Admitting: Physician Assistant

## 2022-12-14 ENCOUNTER — Encounter (HOSPITAL_COMMUNITY): Payer: Self-pay | Admitting: Surgery

## 2022-12-14 ENCOUNTER — Ambulatory Visit (HOSPITAL_COMMUNITY)
Admission: RE | Admit: 2022-12-14 | Discharge: 2022-12-14 | Disposition: A | Discharge status: Home / Self Care | Payer: BC Managed Care – PPO | Source: Ambulatory Visit | Attending: Surgery | Admitting: Surgery

## 2022-12-14 ENCOUNTER — Encounter (HOSPITAL_COMMUNITY)
Admission: RE | Disposition: A | Discharge status: Home / Self Care | Payer: Self-pay | Source: Ambulatory Visit | Attending: Surgery

## 2022-12-14 DIAGNOSIS — K4021 Bilateral inguinal hernia, without obstruction or gangrene, recurrent: Secondary | ICD-10-CM | POA: Diagnosis not present

## 2022-12-14 DIAGNOSIS — K219 Gastro-esophageal reflux disease without esophagitis: Secondary | ICD-10-CM | POA: Insufficient documentation

## 2022-12-14 DIAGNOSIS — I120 Hypertensive chronic kidney disease with stage 5 chronic kidney disease or end stage renal disease: Secondary | ICD-10-CM | POA: Insufficient documentation

## 2022-12-14 DIAGNOSIS — F1290 Cannabis use, unspecified, uncomplicated: Secondary | ICD-10-CM | POA: Insufficient documentation

## 2022-12-14 DIAGNOSIS — Z823 Family history of stroke: Secondary | ICD-10-CM | POA: Insufficient documentation

## 2022-12-14 DIAGNOSIS — F17210 Nicotine dependence, cigarettes, uncomplicated: Secondary | ICD-10-CM | POA: Insufficient documentation

## 2022-12-14 DIAGNOSIS — Z992 Dependence on renal dialysis: Secondary | ICD-10-CM | POA: Insufficient documentation

## 2022-12-14 DIAGNOSIS — Z8673 Personal history of transient ischemic attack (TIA), and cerebral infarction without residual deficits: Secondary | ICD-10-CM | POA: Insufficient documentation

## 2022-12-14 DIAGNOSIS — E1122 Type 2 diabetes mellitus with diabetic chronic kidney disease: Secondary | ICD-10-CM | POA: Insufficient documentation

## 2022-12-14 DIAGNOSIS — N186 End stage renal disease: Secondary | ICD-10-CM | POA: Insufficient documentation

## 2022-12-14 HISTORY — PX: INSERTION OF MESH: SHX5868

## 2022-12-14 HISTORY — PX: INGUINAL HERNIA REPAIR: SHX194

## 2022-12-14 LAB — POCT I-STAT, CHEM 8
BUN: 40 mg/dL — ABNORMAL HIGH (ref 8–23)
Calcium, Ion: 1.1 mmol/L — ABNORMAL LOW (ref 1.15–1.40)
Chloride: 98 mmol/L (ref 98–111)
Creatinine, Ser: 8.8 mg/dL — ABNORMAL HIGH (ref 0.61–1.24)
Glucose, Bld: 76 mg/dL (ref 70–99)
HCT: 35 % — ABNORMAL LOW (ref 39.0–52.0)
Hemoglobin: 11.9 g/dL — ABNORMAL LOW (ref 13.0–17.0)
Potassium: 4.1 mmol/L (ref 3.5–5.1)
Sodium: 142 mmol/L (ref 135–145)
TCO2: 31 mmol/L (ref 22–32)

## 2022-12-14 SURGERY — REPAIR, HERNIA, INGUINAL, BILATERAL, LAPAROSCOPIC
Anesthesia: General | Site: Inguinal | Laterality: Bilateral

## 2022-12-14 MED ORDER — ROCURONIUM BROMIDE 10 MG/ML (PF) SYRINGE
PREFILLED_SYRINGE | INTRAVENOUS | Status: DC | PRN
  Administered 2022-12-14: 15 mg via INTRAVENOUS
  Administered 2022-12-14: 55 mg via INTRAVENOUS

## 2022-12-14 MED ORDER — CHLORHEXIDINE GLUCONATE 0.12 % MT SOLN
15.0000 mL | Freq: Once | OROMUCOSAL | Status: AC
  Administered 2022-12-14: 15 mL via OROMUCOSAL
  Filled 2022-12-14: qty 15

## 2022-12-14 MED ORDER — SODIUM CHLORIDE 0.9 % IV SOLN: INTRAVENOUS | Status: DC | PRN

## 2022-12-14 MED ORDER — ONDANSETRON HCL 4 MG/2ML IJ SOLN: 4.0000 mg | Freq: Once | INTRAMUSCULAR | Status: DC | PRN

## 2022-12-14 MED ORDER — PROPOFOL 10 MG/ML IV BOLUS
INTRAVENOUS | Status: DC | PRN
  Administered 2022-12-14: 130 mg via INTRAVENOUS

## 2022-12-14 MED ORDER — PROPOFOL 10 MG/ML IV BOLUS
INTRAVENOUS | Status: AC
  Filled 2022-12-14: qty 20

## 2022-12-14 MED ORDER — MIDAZOLAM HCL 2 MG/2ML IJ SOLN
INTRAMUSCULAR | Status: AC
  Filled 2022-12-14: qty 2

## 2022-12-14 MED ORDER — DEXAMETHASONE SODIUM PHOSPHATE 10 MG/ML IJ SOLN
INTRAMUSCULAR | Status: DC | PRN
  Administered 2022-12-14: 10 mg via INTRAVENOUS

## 2022-12-14 MED ORDER — BUPIVACAINE LIPOSOME 1.3 % IJ SUSP
INTRAMUSCULAR | Status: AC
  Filled 2022-12-14: qty 20

## 2022-12-14 MED ORDER — LACTATED RINGERS IV SOLN: INTRAVENOUS | Status: DC

## 2022-12-14 MED ORDER — FENTANYL CITRATE (PF) 250 MCG/5ML IJ SOLN
INTRAMUSCULAR | Status: DC | PRN
  Administered 2022-12-14 (×2): 50 ug via INTRAVENOUS

## 2022-12-14 MED ORDER — GABAPENTIN 300 MG PO CAPS
300.0000 mg | ORAL_CAPSULE | ORAL | Status: AC
  Administered 2022-12-14: 300 mg via ORAL
  Filled 2022-12-14: qty 1

## 2022-12-14 MED ORDER — ONDANSETRON HCL 4 MG/2ML IJ SOLN
INTRAMUSCULAR | Status: DC | PRN
  Administered 2022-12-14: 4 mg via INTRAVENOUS

## 2022-12-14 MED ORDER — FENTANYL CITRATE (PF) 100 MCG/2ML IJ SOLN
25.0000 ug | INTRAMUSCULAR | Status: DC | PRN
  Administered 2022-12-14 (×3): 50 ug via INTRAVENOUS

## 2022-12-14 MED ORDER — CHLORHEXIDINE GLUCONATE CLOTH 2 % EX PADS: 6.0000 | MEDICATED_PAD | Freq: Once | CUTANEOUS | Status: DC

## 2022-12-14 MED ORDER — FENTANYL CITRATE (PF) 100 MCG/2ML IJ SOLN
INTRAMUSCULAR | Status: AC
  Filled 2022-12-14: qty 2

## 2022-12-14 MED ORDER — ACETAMINOPHEN 500 MG PO TABS
1000.0000 mg | ORAL_TABLET | ORAL | Status: AC
  Administered 2022-12-14: 1000 mg via ORAL
  Filled 2022-12-14: qty 2

## 2022-12-14 MED ORDER — MIDAZOLAM HCL 2 MG/2ML IJ SOLN
INTRAMUSCULAR | Status: DC | PRN
  Administered 2022-12-14 (×2): 1 mg via INTRAVENOUS

## 2022-12-14 MED ORDER — BUPIVACAINE HCL (PF) 0.5 % IJ SOLN
INTRAMUSCULAR | Status: AC
  Filled 2022-12-14: qty 30

## 2022-12-14 MED ORDER — CEFAZOLIN SODIUM-DEXTROSE 2-4 GM/100ML-% IV SOLN
2.0000 g | INTRAVENOUS | Status: AC
  Administered 2022-12-14: 2 g via INTRAVENOUS
  Filled 2022-12-14: qty 100

## 2022-12-14 MED ORDER — SUGAMMADEX SODIUM 200 MG/2ML IV SOLN
INTRAVENOUS | Status: DC | PRN
  Administered 2022-12-14: 200 mg via INTRAVENOUS

## 2022-12-14 MED ORDER — KETOROLAC TROMETHAMINE 15 MG/ML IJ SOLN
15.0000 mg | INTRAMUSCULAR | Status: DC
  Filled 2022-12-14: qty 1

## 2022-12-14 MED ORDER — OXYCODONE-ACETAMINOPHEN 5-325 MG PO TABS: 1.0000 | ORAL_TABLET | ORAL | 0 refills | Status: DC | PRN

## 2022-12-14 MED ORDER — LIDOCAINE 2% (20 MG/ML) 5 ML SYRINGE
INTRAMUSCULAR | Status: DC | PRN
  Administered 2022-12-14: 80 mg via INTRAVENOUS

## 2022-12-14 MED ORDER — ESMOLOL HCL 100 MG/10ML IV SOLN
INTRAVENOUS | Status: DC | PRN
  Administered 2022-12-14: 30 mg via INTRAVENOUS

## 2022-12-14 MED ORDER — ACETAMINOPHEN 10 MG/ML IV SOLN: 1000.0000 mg | Freq: Once | INTRAVENOUS | Status: DC | PRN

## 2022-12-14 MED ORDER — ORAL CARE MOUTH RINSE: 15.0000 mL | Freq: Once | OROMUCOSAL | Status: AC

## 2022-12-14 MED ORDER — BUPIVACAINE LIPOSOME 1.3 % IJ SUSP
INTRAMUSCULAR | Status: DC | PRN
  Administered 2022-12-14: 20 mL

## 2022-12-14 MED ORDER — FENTANYL CITRATE (PF) 250 MCG/5ML IJ SOLN
INTRAMUSCULAR | Status: AC
  Filled 2022-12-14: qty 5

## 2022-12-14 SURGICAL SUPPLY — 43 items
BAG COUNTER SPONGE SURGICOUNT (BAG) ×2 IMPLANT
CANISTER SUCT 3000ML PPV (MISCELLANEOUS) IMPLANT
COVER SURGICAL LIGHT HANDLE (MISCELLANEOUS) ×2 IMPLANT
DERMABOND ADVANCED .7 DNX12 (GAUZE/BANDAGES/DRESSINGS) ×2 IMPLANT
DERMABOND ADVANCED .7 DNX6 (GAUZE/BANDAGES/DRESSINGS) IMPLANT
DISSECTOR BLUNT TIP ENDO 5MM (MISCELLANEOUS) IMPLANT
ELECT REM PT RETURN 9FT ADLT (ELECTROSURGICAL) ×2
ELECTRODE REM PT RTRN 9FT ADLT (ELECTROSURGICAL) ×2 IMPLANT
GLOVE BIO SURGEON STRL SZ7.5 (GLOVE) ×2 IMPLANT
GLOVE SURG ENC TEXT LTX SZ7.5 (GLOVE) ×2 IMPLANT
GLOVE SURG UNDER LTX SZ8 (GLOVE) ×2 IMPLANT
GOWN STRL REUS W/ TWL LRG LVL3 (GOWN DISPOSABLE) ×4 IMPLANT
GOWN STRL REUS W/ TWL XL LVL3 (GOWN DISPOSABLE) ×2 IMPLANT
GOWN STRL REUS W/TWL LRG LVL3 (GOWN DISPOSABLE) ×4
GOWN STRL REUS W/TWL XL LVL3 (GOWN DISPOSABLE) ×2
GRASPER SUT TROCAR 14GX15 (MISCELLANEOUS) ×2 IMPLANT
KIT BASIN OR (CUSTOM PROCEDURE TRAY) ×2 IMPLANT
KIT TURNOVER KIT B (KITS) ×2 IMPLANT
MESH 3DMAX 5X7 LT XLRG (Mesh General) ×2 IMPLANT
MESH 3DMAX 5X7 RT XLRG (Mesh General) ×2 IMPLANT
NDL 22X1.5 STRL (OR ONLY) (MISCELLANEOUS) ×2 IMPLANT
NDL INSUFFLATION 14GA 120MM (NEEDLE) ×2 IMPLANT
NEEDLE 22X1.5 STRL (OR ONLY) (MISCELLANEOUS) ×2 IMPLANT
NEEDLE INSUFFLATION 14GA 120MM (NEEDLE) ×2 IMPLANT
NS IRRIG 1000ML POUR BTL (IV SOLUTION) ×2 IMPLANT
PAD ARMBOARD 7.5X6 YLW CONV (MISCELLANEOUS) ×4 IMPLANT
RELOAD STAPLE 4.0 BLU F/HERNIA (INSTRUMENTS) ×2 IMPLANT
RELOAD STAPLE 4.8 BLK F/HERNIA (STAPLE) IMPLANT
RELOAD STAPLE HERNIA 4.0 BLUE (INSTRUMENTS) ×2 IMPLANT
RELOAD STAPLE HERNIA 4.8 BLK (STAPLE) ×4 IMPLANT
SCISSORS LAP 5X35 DISP (ENDOMECHANICALS) ×2 IMPLANT
SET IRRIG TUBING LAPAROSCOPIC (IRRIGATION / IRRIGATOR) IMPLANT
SET TUBE SMOKE EVAC HIGH FLOW (TUBING) ×2 IMPLANT
STAPLER HERNIA 12 8.5 360D (INSTRUMENTS) ×2 IMPLANT
SUT MNCRL AB 4-0 PS2 18 (SUTURE) ×2 IMPLANT
TOWEL GREEN STERILE (TOWEL DISPOSABLE) ×2 IMPLANT
TOWEL GREEN STERILE FF (TOWEL DISPOSABLE) ×2 IMPLANT
TRAY FOLEY W/BAG SLVR 14FR (SET/KITS/TRAYS/PACK) IMPLANT
TRAY LAPAROSCOPIC MC (CUSTOM PROCEDURE TRAY) ×2 IMPLANT
TROCAR BLADELESS OPT 5 100 (ENDOMECHANICALS) ×4 IMPLANT
TROCAR XCEL 12X100 BLDLESS (ENDOMECHANICALS) ×2 IMPLANT
WARMER LAPAROSCOPE (MISCELLANEOUS) ×2 IMPLANT
WATER STERILE IRR 1000ML POUR (IV SOLUTION) ×2 IMPLANT

## 2022-12-14 NOTE — H&P (Signed)
Admitting Physician: Nickola Major Jarae Nemmers  Service: General Surgery  CC: hernia  Subjective   HPI: Ryan Salazar is an 71 y.o. male who is here for inguinal hernia repair.   Ryan Salazar is a 71 y.o. male who is seen today as an office consultation for evaluation of New Consultation (Inguinal Hernia ) .   He has noticed a painful bulge occasionally in right groin. The bulge is worse with activity. He has had a left inguinal hernia repair in the [employer] 50 years ago. He had a stroke a few years ago. He is on dialysis.   Past Medical History:  Diagnosis Date   Anemia    Arthritis    per patient, never been dx by MD   Blood transfusion 1989   Chronic kidney disease    dialysis on Mon Wed Friday   Dyspnea    on exertion - no oxygen   GERD (gastroesophageal reflux disease)    HLD (hyperlipidemia)    Hypertension    Sleep apnea    Stroke ([place]) 07/2019   no residual   Ulcer 1989   BLEEDING STOMACH ULCER    Past Surgical History:  Procedure Laterality Date   AV FISTULA PLACEMENT Right 05/02/2021   Procedure: RIGHT ARM ARTERIOVENOUS (AV) FISTULA CREATION;  Surgeon: Waynetta Sandy, MD;  Location: [facility] OR;  Service: Vascular;  Laterality: Right;   COLONOSCOPY     INGUINAL HERNIA REPAIR  1973   TONSILLECTOMY  AS CHILD   WISDOM TOOTH EXTRACTION      Family History  Problem Relation Age of Onset   Stroke Mother    Healthy Father    Colon cancer Neg Hx    Colon polyps Neg Hx     Social:  reports that he has been smoking cigarettes. He has a 12.50 pack-year smoking history. He has never used smokeless tobacco. He reports current alcohol use. He reports current drug use. Drug: Marijuana.  Allergies:  Allergies  Allergen Reactions   Enalapril Swelling    Reported by [facility] - pt is not aware    Medications: Current Outpatient Medications  Medication Instructions   aspirin EC 81 mg, Oral, Daily   atorvastatin (LIPITOR) 40 MG tablet TAKE 1 TABLET BY MOUTH DAILY  AT 6 PM.   calcitRIOL (ROCALTROL) 0.5 mcg, Oral, Every M-W-F (Hemodialysis)   labetalol (NORMODYNE) 300 mg, Oral, 2 times daily   lidocaine-prilocaine (EMLA) cream 1 application , Topical, Daily PRN   Misc Natural Products (SAW PALMETTO) CAPS 1 capsule, Oral, Daily   Multiple Vitamins-Minerals (MULTIVITAMIN WITH MINERALS) tablet 1 tablet, Oral, Daily   omeprazole (PRILOSEC) 20 mg, Oral, Daily   sevelamer carbonate (RENVELA) 800 mg, Oral, 2 times daily with meals, If having 3rd meal, will take 1 tablet     ROS - all of the below systems have been reviewed with the patient and positives are indicated with bold text General: chills, fever or night sweats Eyes: blurry vision or double vision ENT: epistaxis or sore throat Allergy/Immunology: itchy/watery eyes or nasal congestion Hematologic/Lymphatic: bleeding problems, blood clots or swollen lymph nodes Endocrine: temperature intolerance or unexpected weight changes Breast: new or changing breast lumps or nipple discharge Resp: cough, shortness of breath, or wheezing CV: chest pain or dyspnea on exertion GI: as per HPI GU: dysuria, trouble voiding, or hematuria MSK: joint pain or joint stiffness Neuro: TIA or stroke symptoms Derm: pruritus and skin lesion changes Psych: anxiety and depression  Objective   PE Blood pressure Marland Kitchen)  166/98, pulse 79, temperature 97.6 F (36.4 C), temperature source Oral, resp. rate 18, height 5\' 11"  (1.803 m), weight 88 kg, SpO2 100 %. Constitutional: NAD; conversant; no deformities Eyes: Moist conjunctiva; no lid lag; anicteric; PERRL Neck: Trachea midline; no thyromegaly Lungs: Normal respiratory effort; no tactile fremitus CV: RRR; no palpable thrills; no pitting edema GI: Abd Palpable right inguinal hernia, feels indirect, reducible. Palpable left inguinal hernia, reducible  MSK: Normal range of motion of extremities; no clubbing/cyanosis Psychiatric: Appropriate affect; alert and oriented  x3 Lymphatic: No palpable cervical or axillary lymphadenopathy  No results found for this or any previous visit (from the past 24 hour(s)).  Imaging Orders  No imaging studies ordered today  Ultrasound 08/23/22: IMPRESSION: The patient's lump is a hernia containing loops of bowel.    Assessment and Plan   [name] is a 71 year old male with initial right and recurrent left inguinal hernias. I recommended laparoscopic bilateral inguinal hernia repairs. We discussed the procedure, its risks, benefits and alternatives and the patient granted consent to proceed.    Felicie Morn, MD  [facility] Use [email or web address] to contact on call provider

## 2022-12-14 NOTE — Anesthesia Procedure Notes (Signed)
Procedure Name: Intubation Date/Time: 12/14/2022 7:26 AM  Performed by: Carolan Clines, CRNAPre-anesthesia Checklist: Patient identified, Emergency Drugs available, Suction available and Patient being monitored Patient Re-evaluated:Patient Re-evaluated prior to induction Oxygen Delivery Method: Circle System Utilized Preoxygenation: Pre-oxygenation with 100% oxygen Induction Type: IV induction Ventilation: Mask ventilation without difficulty Laryngoscope Size: Mac and 4 Grade View: Grade I Tube type: Oral Tube size: 7.5 mm Number of attempts: 1 Airway Equipment and Method: Stylet Placement Confirmation: ETT inserted through vocal cords under direct vision, positive ETCO2 and breath sounds checked- equal and bilateral Secured at: 22 cm Tube secured with: Tape Dental Injury: Teeth and Oropharynx as per pre-operative assessment

## 2022-12-14 NOTE — Op Note (Signed)
Patient: Ryan Salazar (09/17/1951, [id number])  Date of Surgery: 12/14/2022   Preoperative Diagnosis: initial right recurrent left inguinal hernia   Postoperative Diagnosis: Bilateral inguinal hernias   Surgical Procedure: LAPAROSCOPIC BILATERAL INGUINAL HERNIA REPAIR WITH MESH:    Operative Team Members:  Surgeon(s) and Role:    * Frazer Rainville, Nickola Major, MD - Primary   Sheldon Silvan, MD - [facility] Resident Assistant  Anesthesiologist: Barnet Glasgow, MD CRNA: Lance Coon, CRNA; Carolan Clines, CRNA   Anesthesia: General   Fluids:  No intake/output data recorded.  Complications: None  Drains:  None  Specimen: None  Disposition:  PACU - hemodynamically stable.  Plan of Care: Discharge to home after PACU  Indications for Procedure: Ryan Salazar is a 71 y.o. male who presented with bilateral inguinal hernias.  I recommended laparoscopic repair.  We discussed the risks, benefits and alternatives and the patient granted consent to proceed.  Findings:  Technique: Transabdominal preperitoneal (TAPP) Hernia Location: Right indirect and left direct hernias Mesh Size &Type:  Bard 3D max extra-large right and Bard 3D max extra-large left meshes Mesh Fixation: Endo-Universal hernia stapler  Large right inguinal defect containing large amount of small intestine  Infection status: Patient: Private Patient Elective Case Case: Elective Infection Present At Time Of Surgery (PATOS): None   Description of Procedure:  The patient was positioned supine, padded and secured to the bed, with both arms tucked.  The abdomen was widely prepped and draped.  A time out procedure was performed.  A 1 cm infraumbilical incision was made.  The abdomen was entered without trauma to the underlying viscera.  The abdomen was insufflated to 15 mm of Hg.  A 12 mm trocar was inserted at the periumbilical incision.  Additional 5 mm trocars were placed in the left and right abdomen.  There was no  trauma to the underlying viscera.  There was an indirect hernia on the RIGHT.  Utilizing a transabdominal pre peritoneal technique (TAPP), a horizontal incision was made in the peritoneum, immediately below the umbilicus.  Dissection was carried out in the pre peritoneal space down to the level of the hernia sac which was reduced into the peritoneal cavity completely.  The cord contents were parietalized and preserved.  A large pre peritoneal dissection was performed to uncover the direct, indirect, femoral and obturator spaces.  Cooper's ligament was uncovered medially and the psoas muscle uncovered laterally.  The mesh, as documented above, was opened and advanced into the pre peritoneal position so that it more than adequately covered the indirect, direct, femoral and obturator spaces.  The mesh laid flat, with no inferior folds and covered the entire myopectineal orifice.  The mesh was fixated with the endo-universal hernia stapler to Cooper's ligament and the posterior aspect of the rectus muscle.  The peritoneal flap was closed with the same device.  There were no peritoneal defects or exposed mesh at the conclusion.  There was an direct hernia on the LEFT.  Utilizing a transabdominal pre peritoneal technique (TAPP), a horizontal incision was made in the peritoneum, immediately below the umbilicus.  Dissection was carried out in the pre peritoneal space down to the level of the hernia sac which was reduced into the peritoneal cavity completely.  The cord contents were parietalized and preserved.  A large pre peritoneal dissection was performed to uncover the direct, indirect, femoral and obturator spaces.  Cooper's ligament was uncovered medially and the psoas muscle uncovered laterally.  The mesh, as documented above,  was opened and advanced into the pre peritoneal position so that it more than adequately covered the indirect, direct, femoral and obturator spaces.  The mesh laid flat, with no inferior  folds and covered the entire myopectineal orifice.  The mesh was fixated with the endo-universal hernia stapler to Cooper's ligament and the posterior aspect of the rectus muscle.  The peritoneal flap was closed with the same device.  There were no peritoneal defects or exposed mesh at the conclusion.  The umbilical trocar was removed and the fascial defect was closed with a 0 Vicryl suture.  The peritoneal cavity was completely desufflated, the trocars removed and the skin closed with 4-0 Monocryl subcuticular suture and skin glue.  All sponge and needle counts were correct at the end of the case.  Louanna Raw, MD General, Bariatric, & Minimally Invasive Surgery [facility], [place]

## 2022-12-14 NOTE — Anesthesia Postprocedure Evaluation (Signed)
Anesthesia Post Note  Patient: Ryan Salazar  Procedure(s) Performed: LAPAROSCOPIC BILATERAL INGUINAL HERNIA REPAIR WITH MESH (Bilateral)     Patient location during evaluation: PACU Anesthesia Type: General Level of consciousness: awake and alert Pain management: pain level controlled Vital Signs Assessment: post-procedure vital signs reviewed and stable Respiratory status: spontaneous breathing, nonlabored ventilation, respiratory function stable and patient connected to nasal cannula oxygen Cardiovascular status: blood pressure returned to baseline and stable Postop Assessment: no apparent nausea or vomiting Anesthetic complications: no  No notable events documented.  Last Vitals:  Vitals:   12/14/22 0945 12/14/22 1000  BP: (!) 159/95 (!) 158/92  Pulse: 70 62  Resp: 12 11  Temp:  (!) 36.4 C  SpO2: 98% 95%    Last Pain:  Vitals:   12/14/22 1000  TempSrc:   PainSc: 2                  Barnet Glasgow

## 2022-12-14 NOTE — Transfer of Care (Signed)
Immediate Anesthesia Transfer of Care Note  Patient: Ryan Salazar  Procedure(s) Performed: LAPAROSCOPIC BILATERAL INGUINAL HERNIA REPAIR WITH MESH (Bilateral)  Patient Location: PACU  Anesthesia Type:General  Level of Consciousness: awake, alert , and oriented  Airway & Oxygen Therapy: Patient Spontanous Breathing  Post-op Assessment: Report given to RN and Post -op Vital signs reviewed and stable  Post vital signs: Reviewed and stable  Last Vitals:  Vitals Value Taken Time  BP 175/102 12/14/22 0900  Temp 36.6 C 12/14/22 0900  Pulse 78 12/14/22 0901  Resp 19 12/14/22 0901  SpO2 97 % 12/14/22 0901  Vitals shown include unvalidated device data.  Last Pain:  Vitals:   12/14/22 0634  TempSrc:   PainSc: 0-No pain         Complications: No notable events documented.

## 2022-12-14 NOTE — Discharge Instructions (Signed)
 GROIN HERNIA REPAIR POST OPERATIVE INSTRUCTIONS  Thinking Clearly  The anesthesia may cause you to feel different for 1 or 2 days. Do not drive, drink alcohol, or make any big decisions for at least 2 days.  Nutrition When you wake up, you will be able to drink small amounts of liquid. If you do not feel sick, you can slowly advance your diet to regular foods. Continue to drink lots of fluids, usually about 8 to 10 glasses per day. Eat a high-fiber diet so you don't strain during bowel movements. High-Fiber Foods Foods high in fiber include beans, bran cereals and whole-grain breads, peas, dried fruit (figs, apricots, and dates), raspberries, blackberries, strawberries, sweet corn, broccoli, baked potatoes with skin, plums, pears, apples, greens, and nuts. Activity Slowly increase your activity. Be sure to get up and walk every hour or so to prevent blood clots. No heavy lifting or strenuous activity for 4 weeks following surgery to prevent hernias at your incision sites or recurrence of your hernia. It is normal to feel tired. You may need more sleep than usual.  Get your rest but make sure to get up and move around frequently to prevent blood clots and pneumonia.  Work and Return to School You can go back to work when you feel well enough. Discuss the timing with your surgeon. You can usually go back to school or work 1 week or less after an laparoscopic or an open repair. If your work requires heavy lifting or strenuous activity you need to be placed on light duty for 4 weeks following surgery. You can return to gym class, sports or other physical activities 4 weeks after surgery.  Wound Care You may experience significant bruising in the groin including into the scrotum in males.  Rest, elevating the groin and scrotum above the level of the heart, ice and compression with tight fitting underwear can help.  Always wash your hands before and after touching near your incision site. Do  not soak in a bathtub until cleared at your follow up appointment. You may take a shower 24 hours after surgery. A small amount of drainage from the incision is normal. If the drainage is thick and yellow or the site is red, you may have an infection, so call your surgeon. If you have a drain in one of your incisions, it will be taken out in office when the drainage stops. Steri-Strips will fall off in 7 to 10 days or they will be removed during your first office visit. If you have dermabond glue covering over the incision, allow the glue to flake off on its own. Protect the new skin, especially from the sun. The sun can burn and cause darker scarring. Your scar will heal in about 4 to 6 weeks and will become softer and continue to fade over the next year.  The cosmetic appearance of the incisions will improve over the course of the first year after surgery. Sensation around your incision will return in a few weeks or months.  Bowel Movements After intestinal surgery, you may have loose watery stools for several days. If watery diarrhea lasts longer than 3 days, contact your surgeon. Pain medication (narcotics) can cause constipation. Increase the fiber in your diet with high-fiber foods if you are constipated. You can take an over the counter stool softener like Colace to avoid constipation.  Additional over the counter medications can also be used if Colace isn't sufficient (for example, Milk of Magnesia or Miralax).    Pain The amount of pain is different for each person. Some people need only 1 to 3 doses of pain control medication, while others need more. Take alternating doses of tylenol and ibuprofen around the clock for the first five days following surgery.  This will provide a baseline of pain control and help with inflammation.  Take the narcotic pain medication in addition if needed for severe pain.  Contact Your Surgeon at [phone], if you have: Pain that will not go away Pain that  gets worse A fever of more than 101F (38.3C) Repeated vomiting Swelling, redness, bleeding, or bad-smelling drainage from your wound site Strong abdominal pain No bowel movement or unable to pass gas for 3 days Watery diarrhea lasting longer than 3 days  Pain Control The goal of pain control is to minimize pain, keep you moving and help you heal. Your surgical team will work with you on your pain plan. Most often a combination of therapies and medications are used to control your pain. You may also be given medication (local anesthetic) at the surgical site. This may help control your pain for several days. Extreme pain puts extra stress on your body at a time when your body needs to focus on healing. Do not wait until your pain has reached a level "10" or is unbearable before telling your doctor or nurse. It is much easier to control pain before it becomes severe. Following a laparoscopic procedure, pain is sometimes felt in the shoulder. This is due to the gas inserted into your abdomen during the procedure. Moving and walking helps to decrease the gas and the right shoulder pain.  Use the guide below for ways to manage your post-operative pain. Learn more by going to facs.org/safepaincontrol.  How Intense Is My Pain Common Therapies to Feel Better       I hardly notice my pain, and it does not interfere with my activities.  I notice my pain and it distracts me, but I can still do activities (sitting up, walking, standing).  Non-Medication Therapies  Ice (in a bag, applied over clothing at the surgical site), elevation, rest, meditation, massage, distraction (music, TV, play) walking and mild exercise Splinting the abdomen with pillows +  Non-Opioid Medications Acetaminophen (Tylenol) Non-steroidal anti-inflammatory drugs (NSAIDS) Aspirin, Ibuprofen (Motrin, Advil) Naproxen (Aleve) Take these as needed, when you feel pain. Both acetaminophen and NSAIDs help to decrease pain  and swelling (inflammation).      My pain is hard to ignore and is more noticeable even when I rest.  My pain interferes with my usual activities.  Non-Medication Therapies  +  Non-Opioid medications  Take on a regular schedule (around-the-clock) instead of as needed. (For example, Tylenol every 6 hours at 9:00 am, 3:00 pm, 9:00 pm, 3:00 am and Motrin every 6 hours at 12:00 am, 6:00 am, 12:00 pm, 6:00 pm)         I am focused on my pain, and I am not doing my daily activities.  I am groaning in pain, and I cannot sleep. I am unable to do anything.  My pain is as bad as it could be, and nothing else matters.  Non-Medication Therapies  +  Around-the-Clock Non-Opioid Medications  +  Short-acting opioids  Opioids should be used with other medications to manage severe pain. Opioids block pain and give a feeling of euphoria (feel high). Addiction, a serious side effect of opioids, is rare with short-term (a few days) use.  Examples of short-acting opioids   include: Tramadol (Ultram), Hydrocodone (Norco, Vicodin), Hydromorphone (Dilaudid), Oxycodone (Oxycontin)     The above directions have been adapted from the American [employer] Surgical Patient Education Program.  Please refer to the ACS website if needed: https://[email or web address].org/-/media/files/education/[email or web address].ashx   Kaveri Perras, MD [facility] [address], [address], [place], [place]  [address] ?  [address] [address], [place], [place]   [address] [phone] ? [phone] ? FAX [phone] Web site: [email or web address]  

## 2022-12-15 ENCOUNTER — Encounter (HOSPITAL_COMMUNITY): Payer: Self-pay | Admitting: Surgery

## 2022-12-20 LAB — CBC AND DIFFERENTIAL
HCT: 28 — AB (ref 41–53)
Hemoglobin: 9.4 — AB (ref 13.5–17.5)
Platelets: 222 10*3/uL (ref 150–400)
WBC: 8.3

## 2022-12-20 LAB — BASIC METABOLIC PANEL
BUN: 9 (ref 4–21)
Chloride: 102 (ref 99–108)
Creatinine: 10.5 — AB (ref 0.6–1.3)
Sodium: 142 (ref 137–147)

## 2022-12-20 LAB — HEPATITIS B SURFACE ANTIGEN: Hepatitis B Surface Ag: >1000

## 2022-12-20 LAB — IRON,TIBC AND FERRITIN PANEL
Ferritin: 329
TIBC: 237
UIBC: 188

## 2022-12-20 LAB — CBC: RBC: 3.13 — AB (ref 3.87–5.11)

## 2022-12-20 LAB — VITAMIN D 25 HYDROXY (VIT D DEFICIENCY, FRACTURES): Vit D, 25-Hydroxy: 49.1

## 2022-12-20 LAB — HEPATIC FUNCTION PANEL: Alkaline Phosphatase: 83 (ref 25–125)

## 2022-12-20 LAB — COMPREHENSIVE METABOLIC PANEL: Albumin: 4.4 (ref 3.5–5.0)

## 2022-12-25 ENCOUNTER — Encounter (HOSPITAL_COMMUNITY): Payer: Self-pay | Admitting: Surgery

## 2022-12-25 NOTE — Progress Notes (Signed)
Not seen

## 2023-01-09 ENCOUNTER — Ambulatory Visit: Payer: Medicare Other | Admitting: Nurse Practitioner

## 2023-01-31 ENCOUNTER — Encounter: Payer: Self-pay | Admitting: Nurse Practitioner

## 2023-01-31 ENCOUNTER — Ambulatory Visit: Payer: BC Managed Care – PPO | Admitting: Nurse Practitioner

## 2023-01-31 ENCOUNTER — Ambulatory Visit: Payer: Medicare Other | Admitting: Nurse Practitioner

## 2023-01-31 VITALS — BP 130/68 | HR 69 | Temp 98.4°F | Ht 71.0 in | Wt 195.0 lb

## 2023-01-31 DIAGNOSIS — Z9889 Other specified postprocedural states: Secondary | ICD-10-CM | POA: Diagnosis not present

## 2023-01-31 DIAGNOSIS — N186 End stage renal disease: Secondary | ICD-10-CM

## 2023-01-31 DIAGNOSIS — Z1211 Encounter for screening for malignant neoplasm of colon: Secondary | ICD-10-CM

## 2023-01-31 DIAGNOSIS — E782 Mixed hyperlipidemia: Secondary | ICD-10-CM | POA: Diagnosis not present

## 2023-01-31 DIAGNOSIS — I120 Hypertensive chronic kidney disease with stage 5 chronic kidney disease or end stage renal disease: Secondary | ICD-10-CM

## 2023-01-31 DIAGNOSIS — I10 Essential (primary) hypertension: Secondary | ICD-10-CM

## 2023-01-31 DIAGNOSIS — Z23 Encounter for immunization: Secondary | ICD-10-CM | POA: Diagnosis not present

## 2023-01-31 DIAGNOSIS — Z8719 Personal history of other diseases of the digestive system: Secondary | ICD-10-CM

## 2023-01-31 DIAGNOSIS — Z992 Dependence on renal dialysis: Secondary | ICD-10-CM

## 2023-01-31 NOTE — Progress Notes (Addendum)
Subjective:     Patient ID: Ryan Salazar , male    DOB: 26-Apr-1951 , 72 y.o.   MRN: [id number]   No chief complaint on file.   HPI  Here for BP follow up. He had his hernia surgery in December continues to have a knot to his bilateral groin. His mother passed away over the Holidays. Continues with Dialysis MWF and is doing well.      Past Medical History:  Diagnosis Date   Anemia    Arthritis    per patient, never been dx by MD   Blood transfusion 1989   Chronic kidney disease    dialysis on Mon Wed Friday   Dyspnea    on exertion - no oxygen   GERD (gastroesophageal reflux disease)    HLD (hyperlipidemia)    Hypertension    Sleep apnea    Stroke ([place]) 07/2019   no residual   Ulcer 1989   BLEEDING STOMACH ULCER     Family History  Problem Relation Age of Onset   Stroke Mother    Healthy Father    Colon cancer Neg Hx    Colon polyps Neg Hx      Current Outpatient Medications:    aspirin EC 81 MG EC tablet, Take 1 tablet (81 mg total) by mouth daily., Disp: 30 tablet, Rfl: 1   atorvastatin (LIPITOR) 40 MG tablet, TAKE 1 TABLET BY MOUTH DAILY AT 6 PM., Disp: 30 tablet, Rfl: 0   calcitRIOL (ROCALTROL) 0.5 MCG capsule, Take 0.5 mcg by mouth every Monday, Wednesday, and Friday with hemodialysis., Disp: , Rfl:    labetalol (NORMODYNE) 300 MG tablet, Take 1 tablet (300 mg total) by mouth 2 (two) times daily. (Patient taking differently: Take 300 mg by mouth See admin instructions. 300 mg by mouth twice daily EXCEPT days of Dialysis, MWF, Take 300 mg in the evening), Disp: 90 tablet, Rfl: 1   lidocaine-prilocaine (EMLA) cream, Apply 1 application  topically daily as needed (prior to port access)., Disp: , Rfl:    Misc Natural Products (SAW PALMETTO) CAPS, Take 1 capsule by mouth daily., Disp: , Rfl:    Multiple Vitamins-Minerals (MULTIVITAMIN WITH MINERALS) tablet, Take 1 tablet by mouth daily., Disp: , Rfl:    omeprazole (PRILOSEC) 20 MG capsule, Take 20 mg by mouth  daily., Disp: , Rfl:    Allergies  Allergen Reactions   Enalapril Swelling    Reported by [facility] - pt is not aware     Review of Systems  Constitutional: Negative.   HENT: Negative.    Respiratory: Negative.    Cardiovascular: Negative.   Musculoskeletal: Negative.   Psychiatric/Behavioral: Negative.       Today's Vitals   01/31/23 1457  BP: 130/68  Pulse: 69  Temp: 98.4 F (36.9 C)  TempSrc: Oral  Weight: 195 lb (88.5 kg)  Height: '5\' 11"'$  (1.803 m)   Body mass index is 27.2 kg/m.   Objective:  Physical Exam Vitals reviewed.  Constitutional:      General: He is not in acute distress.    Appearance: He is well-developed.  Cardiovascular:     Rate and Rhythm: Normal rate and regular rhythm.     Pulses: Normal pulses.     Heart sounds: No murmur heard. Pulmonary:     Effort: Pulmonary effort is normal. No respiratory distress.     Breath sounds: Normal breath sounds. No wheezing.  Skin:    General: Skin is warm and dry.  Neurological:     General: No focal deficit present.     Mental Status: He is alert.         Assessment And Plan:     1. 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  2. ESRD on dialysis [facility]) Comments: He is doing well with his dialysis.  3. Mixed hyperlipidemia Comments: Cholesterol levels are stable, continue statin, tolerating well. - Lipid panel - Liver Profile  4. S/P bilateral inguinal hernia repair Comments: He continues to have a slight raised area to his right inguinal area, I have advised him to contact his surgeon  5. Encounter for screening colonoscopy According to USPTF Colorectal cancer Screening guidelines. Colonoscopy is recommended every 10 years, starting at age 39 years. Will refer to GI for colon cancer screening. - Ambulatory referral to Gastroenterology  6. Need for zoster vaccination Shingrix #1 given in office. TransRx done -  Zoster Recombinant (Shingrix )     Patient was given opportunity to ask questions. Patient verbalized understanding of the plan and was able to repeat key elements of the plan. All questions were answered to their satisfaction.  [name], FNP   I, [name], FNP, have reviewed all documentation for this visit. The documentation on 01/31/23 for the exam, diagnosis, procedures, and orders are all accurate and complete.   IF YOU HAVE BEEN REFERRED TO A SPECIALIST, IT MAY TAKE 1-2 WEEKS TO SCHEDULE/PROCESS THE REFERRAL. IF YOU HAVE NOT HEARD FROM US/SPECIALIST IN TWO WEEKS, PLEASE GIVE [place] A CALL AT [phone].   THE PATIENT IS ENCOURAGED TO PRACTICE SOCIAL DISTANCING DUE TO THE COVID-19 PANDEMIC.

## 2023-02-02 LAB — HEPATIC FUNCTION PANEL
ALT: 11 IU/L (ref 0–44)
AST: 17 IU/L (ref 0–40)
Albumin: 4.7 g/dL (ref 3.8–4.8)
Alkaline Phosphatase: 93 IU/L (ref 44–121)
Bilirubin Total: 0.3 mg/dL (ref 0.0–1.2)
Bilirubin, Direct: 0.1 mg/dL (ref 0.00–0.40)
Total Protein: 7 g/dL (ref 6.0–8.5)

## 2023-02-02 LAB — LIPID PANEL
Chol/HDL Ratio: 2.2 ratio (ref 0.0–5.0)
Cholesterol, Total: 97 mg/dL — ABNORMAL LOW (ref 100–199)
HDL: 45 mg/dL (ref >39)
LDL Chol Calc (NIH): 34 mg/dL (ref 0–99)
Triglycerides: 96 mg/dL (ref 0–149)
VLDL Cholesterol Cal: 18 mg/dL (ref 5–40)

## 2023-02-04 ENCOUNTER — Encounter: Payer: Self-pay | Admitting: Nurse Practitioner

## 2023-02-11 DIAGNOSIS — Z1211 Encounter for screening for malignant neoplasm of colon: Secondary | ICD-10-CM | POA: Insufficient documentation

## 2023-02-11 DIAGNOSIS — E782 Mixed hyperlipidemia: Secondary | ICD-10-CM | POA: Insufficient documentation

## 2023-02-11 DIAGNOSIS — Z8719 Personal history of other diseases of the digestive system: Secondary | ICD-10-CM | POA: Insufficient documentation

## 2023-02-11 DIAGNOSIS — Z0000 Encounter for general adult medical examination without abnormal findings: Secondary | ICD-10-CM | POA: Insufficient documentation

## 2023-06-04 ENCOUNTER — Ambulatory Visit (INDEPENDENT_AMBULATORY_CARE_PROVIDER_SITE_OTHER): Payer: BC Managed Care – PPO | Admitting: Nurse Practitioner

## 2023-06-04 ENCOUNTER — Encounter: Payer: Self-pay | Admitting: Nurse Practitioner

## 2023-06-04 VITALS — BP 120/60 | HR 84 | Temp 98.4°F | Ht 71.0 in | Wt 191.0 lb

## 2023-06-04 DIAGNOSIS — Z8673 Personal history of transient ischemic attack (TIA), and cerebral infarction without residual deficits: Secondary | ICD-10-CM

## 2023-06-04 DIAGNOSIS — Z992 Dependence on renal dialysis: Secondary | ICD-10-CM | POA: Diagnosis not present

## 2023-06-04 DIAGNOSIS — N186 End stage renal disease: Secondary | ICD-10-CM | POA: Diagnosis not present

## 2023-06-04 DIAGNOSIS — I120 Hypertensive chronic kidney disease with stage 5 chronic kidney disease or end stage renal disease: Secondary | ICD-10-CM

## 2023-06-04 DIAGNOSIS — Z0000 Encounter for general adult medical examination without abnormal findings: Secondary | ICD-10-CM

## 2023-06-04 DIAGNOSIS — H6123 Impacted cerumen, bilateral: Secondary | ICD-10-CM | POA: Diagnosis not present

## 2023-06-04 DIAGNOSIS — E782 Mixed hyperlipidemia: Secondary | ICD-10-CM | POA: Diagnosis not present

## 2023-06-04 DIAGNOSIS — Z23 Encounter for immunization: Secondary | ICD-10-CM | POA: Diagnosis not present

## 2023-06-04 NOTE — Progress Notes (Signed)
Hershal Coria Martin,acting as a Neurosurgeon for Arnette Felts, FNP.,have documented all relevant documentation on the behalf of Arnette Felts, FNP,as directed by  Arnette Felts, FNP while in the presence of Arnette Felts, FNP.   Subjective:     Patient ID: Ryan Salazar , male    DOB: 04-05-1951 , 72 y.o.   MRN: [id number]   Chief Complaint  Patient presents with   Annual Exam    HPI  Patient presents today for HM, patient reports compliance with medications and has no other concerns today. Yesterday he did not have a good day at dialysis he had bleeding twice before he left. He will plan to schedule his colonoscopy due to his dialysis MWF.   BP Readings from Last 3 Encounters: 06/04/23 : 120/60 01/31/23 : 130/68 12/14/22 : (!) 158/92       Past Medical History:  Diagnosis Date   Anemia    Arthritis    per patient, never been dx by MD   Blood transfusion 1989   Chronic kidney disease    dialysis on Mon Wed Friday   Dyspnea    on exertion - no oxygen   GERD (gastroesophageal reflux disease)    HLD (hyperlipidemia)    Hypertension    Sleep apnea    Stroke ([facility]) 07/2019   no residual   Ulcer 1989   BLEEDING STOMACH ULCER     Family History  Problem Relation Age of Onset   Stroke Mother    Healthy Father    Colon cancer Neg Hx    Colon polyps Neg Hx      Current Outpatient Medications:    aspirin EC 81 MG EC tablet, Take 1 tablet (81 mg total) by mouth daily., Disp: 30 tablet, Rfl: 1   atorvastatin (LIPITOR) 40 MG tablet, TAKE 1 TABLET BY MOUTH DAILY AT 6 PM., Disp: 30 tablet, Rfl: 0   calcitRIOL (ROCALTROL) 0.5 MCG capsule, Take 0.5 mcg by mouth every Monday, Wednesday, and Friday with hemodialysis., Disp: , Rfl:    labetalol (NORMODYNE) 300 MG tablet, Take 1 tablet (300 mg total) by mouth 2 (two) times daily. (Patient taking differently: Take 300 mg by mouth See admin instructions. 300 mg by mouth twice daily EXCEPT days of Dialysis, MWF, Take 300 mg in the evening), Disp:  90 tablet, Rfl: 1   lidocaine-prilocaine (EMLA) cream, Apply 1 application  topically daily as needed (prior to port access)., Disp: , Rfl:    Misc Natural Products (SAW PALMETTO) CAPS, Take 1 capsule by mouth daily., Disp: , Rfl:    Multiple Vitamins-Minerals (MULTIVITAMIN WITH MINERALS) tablet, Take 1 tablet by mouth daily., Disp: , Rfl:    omeprazole (PRILOSEC) 20 MG capsule, Take 20 mg by mouth daily., Disp: , Rfl:    Allergies  Allergen Reactions   Enalapril Swelling    Reported by [facility] - pt is not aware     Men's preventive visit. Patient Health Questionnaire (PHQ-2) is  Flowsheet Row Office Visit from 08/21/2022 in [facility]  PHQ-2 Total Score 0      Patient is on a renal diet. Exercises with riding regular bicycle.  Marital status: Single. Relevant history for alcohol use is:  Social History   Substance and Sexual Activity  Alcohol Use Yes   Comment: occasional on weekends   Relevant history for tobacco use is:  Social History   Tobacco Use  Smoking Status Some Days   Packs/day: 0.25   Years: 50.00  Additional pack years: 0.00   Total pack years: 12.50   Types: Cigarettes  Smokeless Tobacco Never  Tobacco Comments   3 - 4 cigarettes per day states "not even a whole cigarette". 01/31/22 - smoking 2-3 cigarettes a day    Review of Systems  Constitutional: Negative.   HENT: Negative.    Eyes: Negative.   Respiratory: Negative.    Cardiovascular: Negative.   Gastrointestinal: Negative.   Endocrine: Negative.   Genitourinary: Negative.   Musculoskeletal: Negative.   Skin: Negative.   Allergic/Immunologic: Negative.   Hematological: Negative.   Psychiatric/Behavioral: Negative.       Today's Vitals   06/04/23 1459  BP: 120/60  Pulse: 84  Temp: 98.4 F (36.9 C)  TempSrc: Oral  Weight: 191 lb (86.6 kg)  Height: 5\' 11"  (1.803 m)  PainSc: 0-No pain   Body mass index is 26.64 kg/m.   Objective:  Physical  Exam Vitals reviewed.  Constitutional:      General: He is not in acute distress.    Appearance: Normal appearance.  HENT:     Head: Normocephalic and atraumatic.     Right Ear: Tympanic membrane, ear canal and external ear normal. There is no impacted cerumen (excess cerumen not impacted).     Left Ear: Tympanic membrane, ear canal and external ear normal. There is no impacted cerumen (excess cerumen not impacted).     Nose: Nose normal.     Mouth/Throat:     Mouth: Mucous membranes are moist.  Eyes:     Pupils: Pupils are equal, round, and reactive to light.  Cardiovascular:     Rate and Rhythm: Normal rate and regular rhythm.     Pulses: Normal pulses.     Heart sounds: Normal heart sounds. No murmur heard. Pulmonary:     Effort: Pulmonary effort is normal. No respiratory distress.     Breath sounds: Normal breath sounds.  Abdominal:     General: Abdomen is flat. Bowel sounds are normal. There is no distension.     Palpations: Abdomen is soft.  Genitourinary:    Comments: NA Musculoskeletal:        General: Normal range of motion.     Cervical back: Normal range of motion and neck supple.  Skin:    General: Skin is warm.     Capillary Refill: Capillary refill takes less than 2 seconds.  Neurological:     General: No focal deficit present.     Mental Status: He is alert and oriented to person, place, and time.     Cranial Nerves: No cranial nerve deficit.     Motor: No weakness.  Psychiatric:        Mood and Affect: Mood normal.        Behavior: Behavior normal.        Thought Content: Thought content normal.        Judgment: Judgment normal.         Assessment And Plan:    1. Encounter for annual health examination Behavior modifications discussed and diet history reviewed.   Pt will continue to exercise regularly and modify diet with low GI, plant based foods and decrease intake of processed foods.  Recommend intake of daily multivitamin, Vitamin D, and calcium.   Recommend colonoscopy for preventive screenings, as well as recommend immunizations that include influenza, TDAP, and Shingles  2. Benign hypertension with end-stage renal disease (HCC) Comments: Blood pressure is controlled, continue current medications and f/u with Cardiology. EKG done  with SR with nospecific t abnormaligy HR 71 - EKG 12-Lead  3. Mixed hyperlipidemia Comments: Cholesterol levels are stable. Continue current medications - Lipid panel  4. Excessive cerumen in both ear canals Comments: Removed small amount of firm cerumen to ear canal, removed with lighted curette  5. COVID-19 vaccine administered - Pfizer Fall 2023 Covid-19 Vaccine 47yrs and older  6. ESRD on dialysis [facility]) Comments: Continue dialysis MWF - CBC with Differential/Platelet - CMP14+EGFR  7. History of stroke    Return for 1 year physical, 6 month chol check.  Patient was given opportunity to ask questions. Patient verbalized understanding of the plan and was able to repeat key elements of the plan. All questions were answered to their satisfaction.   Arnette Felts, FNP   I, Arnette Felts, FNP, have reviewed all documentation for this visit. The documentation on 06/04/23 for the exam, diagnosis, procedures, and orders are all accurate and complete.  THE PATIENT IS ENCOURAGED TO PRACTICE SOCIAL DISTANCING DUE TO THE COVID-19 PANDEMIC.

## 2023-08-15 ENCOUNTER — Encounter: Payer: Self-pay | Admitting: Nurse Practitioner

## 2023-11-30 ENCOUNTER — Encounter: Payer: Self-pay | Admitting: Nurse Practitioner

## 2023-12-10 ENCOUNTER — Ambulatory Visit: Payer: BC Managed Care – PPO | Admitting: Nurse Practitioner

## 2023-12-10 ENCOUNTER — Encounter: Payer: Self-pay | Admitting: Nurse Practitioner

## 2023-12-10 VITALS — BP 120/80 | HR 66 | Temp 98.5°F | Ht 71.0 in | Wt 196.2 lb

## 2023-12-10 DIAGNOSIS — I120 Hypertensive chronic kidney disease with stage 5 chronic kidney disease or end stage renal disease: Secondary | ICD-10-CM | POA: Diagnosis not present

## 2023-12-10 DIAGNOSIS — E782 Mixed hyperlipidemia: Secondary | ICD-10-CM

## 2023-12-10 DIAGNOSIS — E663 Overweight: Secondary | ICD-10-CM

## 2023-12-10 DIAGNOSIS — Z23 Encounter for immunization: Secondary | ICD-10-CM | POA: Diagnosis not present

## 2023-12-10 DIAGNOSIS — Z992 Dependence on renal dialysis: Secondary | ICD-10-CM | POA: Insufficient documentation

## 2023-12-10 DIAGNOSIS — Z6827 Body mass index (BMI) 27.0-27.9, adult: Secondary | ICD-10-CM

## 2023-12-10 DIAGNOSIS — N186 End stage renal disease: Secondary | ICD-10-CM

## 2023-12-10 NOTE — Assessment & Plan Note (Signed)
Blood pressure is controlled, continue current medications

## 2023-12-10 NOTE — Progress Notes (Signed)
Madelaine Bhat, CMA,acting as a Neurosurgeon for Arnette Felts, FNP.,have documented all relevant documentation on the behalf of Arnette Felts, FNP,as directed by  Arnette Felts, FNP while in the presence of Arnette Felts, FNP.  Subjective:  Patient ID: Ryan Salazar , male    DOB: 1951/12/18 , 72 y.o.   MRN: [id number]  Chief Complaint  Patient presents with   Hypertension    HPI  Patient presents today for a bp and chol follow up, Patient reports compliance with medication. Patient denies any chest pain, SOB, or headaches. Patient has no concerns today. Continues with dialysis and doing well, he is on MWF.      Past Medical History:  Diagnosis Date   Anemia    Arthritis    per patient, never been dx by MD   Blood transfusion 1989   Chronic kidney disease    dialysis on Mon Wed Friday   Dyspnea    on exertion - no oxygen   GERD (gastroesophageal reflux disease)    HLD (hyperlipidemia)    Hypertension    Sleep apnea    Stroke ([facility]) 07/2019   no residual   Ulcer 1989   BLEEDING STOMACH ULCER     Family History  Problem Relation Age of Onset   Stroke Mother    Healthy Father    Colon cancer Neg Hx    Colon polyps Neg Hx      Current Outpatient Medications:    aspirin EC 81 MG EC tablet, Take 1 tablet (81 mg total) by mouth daily., Disp: 30 tablet, Rfl: 1   atorvastatin (LIPITOR) 40 MG tablet, TAKE 1 TABLET BY MOUTH DAILY AT 6 PM., Disp: 30 tablet, Rfl: 0   calcitRIOL (ROCALTROL) 0.5 MCG capsule, Take 0.5 mcg by mouth every Monday, Wednesday, and Friday with hemodialysis., Disp: , Rfl:    labetalol (NORMODYNE) 300 MG tablet, Take 1 tablet (300 mg total) by mouth 2 (two) times daily. (Patient taking differently: Take 300 mg by mouth See admin instructions. 300 mg by mouth twice daily EXCEPT days of Dialysis, MWF, Take 300 mg in the evening), Disp: 90 tablet, Rfl: 1   lidocaine-prilocaine (EMLA) cream, Apply 1 application  topically daily as needed (prior to port access)., Disp: ,  Rfl:    omeprazole (PRILOSEC) 20 MG capsule, Take 20 mg by mouth daily., Disp: , Rfl:    Misc Natural Products (SAW PALMETTO) CAPS, Take 1 capsule by mouth daily., Disp: , Rfl:    Multiple Vitamins-Minerals (MULTIVITAMIN WITH MINERALS) tablet, Take 1 tablet by mouth daily. (Patient not taking: Reported on 12/10/2023), Disp: , Rfl:    Allergies  Allergen Reactions   Enalapril Swelling    Reported by [facility] - pt is not aware     Review of Systems  Constitutional: Negative.   HENT: Negative.    Eyes: Negative.   Respiratory: Negative.    Cardiovascular: Negative.   Gastrointestinal: Negative.   Musculoskeletal: Negative.   Neurological: Negative.   Psychiatric/Behavioral: Negative.       Today's Vitals   12/10/23 1510  BP: 120/80  Pulse: 66  Temp: 98.5 F (36.9 C)  TempSrc: Oral  Weight: 196 lb 3.2 oz (89 kg)  Height: 5\' 11"  (1.803 m)  PainSc: 0-No pain   Body mass index is 27.36 kg/m.  Wt Readings from Last 3 Encounters:  12/20/23 196 lb 3.4 oz (89 kg)  12/10/23 196 lb 3.2 oz (89 kg)  06/04/23 191 lb (86.6 kg)  Objective:  Physical Exam Vitals reviewed.  Constitutional:      General: He is not in acute distress.    Appearance: Normal appearance.  Cardiovascular:     Rate and Rhythm: Normal rate and regular rhythm.     Pulses: Normal pulses.     Heart sounds: Normal heart sounds. No murmur heard. Pulmonary:     Effort: Pulmonary effort is normal. No respiratory distress.     Breath sounds: Normal breath sounds. No wheezing.  Skin:    Capillary Refill: Capillary refill takes less than 2 seconds.  Neurological:     General: No focal deficit present.     Mental Status: He is alert and oriented to person, place, and time.     Cranial Nerves: No cranial nerve deficit.     Motor: No weakness.         Assessment And Plan:  Benign hypertension with end-stage renal disease (HCC) Assessment & Plan: Blood pressure is controlled, continue current  medications   Orders: -     BMP8+eGFR  Mixed hyperlipidemia Assessment & Plan: Cholesterol levels are stable. Continue statin, tolerating well.   Orders: -     Lipid panel  COVID-19 vaccine administered Assessment & Plan: Declines covid 19 vaccine. Discussed risk of covid 62 and if he changes her mind about the vaccine to call the office. Education has been provided regarding the importance of this vaccine but patient still declined. Advised may receive this vaccine at local pharmacy or Health Dept.or vaccine clinic. Aware to provide a copy of the vaccination record if obtained from local pharmacy or Health Dept.  Encouraged to take multivitamin, vitamin d, vitamin c and zinc to increase immune system. Aware can call office if would like to have vaccine here at office. Verbalized acceptance and understanding.   Orders: -     Pfizer Comirnaty Covid-19 Vaccine 60yrs & older  Overweight with body mass index (BMI) of 27 to 27.9 in adult  Dependence on renal dialysis [facility]) Assessment & Plan: Continue dialysis MWF.    I have given him the number to [facility] to schedule his colonoscopy.   Return for 6 month bp check.  Patient was given opportunity to ask questions. Patient verbalized understanding of the plan and was able to repeat key elements of the plan. All questions were answered to their satisfaction.   Jeanell Sparrow, FNP, have reviewed all documentation for this visit. The documentation on 12/10/23 for the exam, diagnosis, procedures, and orders are all accurate and complete.   IF YOU HAVE BEEN REFERRED TO A SPECIALIST, IT MAY TAKE 1-2 WEEKS TO SCHEDULE/PROCESS THE REFERRAL. IF YOU HAVE NOT HEARD FROM US/SPECIALIST IN TWO WEEKS, PLEASE GIVE [place] A CALL AT [phone].

## 2023-12-10 NOTE — Patient Instructions (Addendum)
Call to [facility] [phone] for your colonoscopy.

## 2023-12-10 NOTE — Assessment & Plan Note (Signed)
Cholesterol levels are stable.  Continue statin, tolerating well.

## 2023-12-10 NOTE — Assessment & Plan Note (Signed)
Continue dialysis MWF.

## 2023-12-11 LAB — LIPID PANEL
Chol/HDL Ratio: 2.7 {ratio} (ref 0.0–5.0)
Cholesterol, Total: 88 mg/dL — ABNORMAL LOW (ref 100–199)
HDL: 33 mg/dL — ABNORMAL LOW (ref >39)
LDL Chol Calc (NIH): 32 mg/dL (ref 0–99)
Triglycerides: 133 mg/dL (ref 0–149)
VLDL Cholesterol Cal: 23 mg/dL (ref 5–40)

## 2023-12-11 LAB — BMP8+EGFR
BUN/Creatinine Ratio: 4 — ABNORMAL LOW (ref 10–24)
BUN: 33 mg/dL — ABNORMAL HIGH (ref 8–27)
CO2: 27 mmol/L (ref 20–29)
Calcium: 9.4 mg/dL (ref 8.6–10.2)
Chloride: 97 mmol/L (ref 96–106)
Creatinine, Ser: 8.98 mg/dL — ABNORMAL HIGH (ref 0.76–1.27)
Glucose: 70 mg/dL (ref 70–99)
Potassium: 4.7 mmol/L (ref 3.5–5.2)
Sodium: 142 mmol/L (ref 134–144)
eGFR: 6 mL/min/{1.73_m2} — ABNORMAL LOW (ref >59)

## 2023-12-20 ENCOUNTER — Emergency Department (HOSPITAL_COMMUNITY)
Admission: EM | Admit: 2023-12-20 | Discharge: 2023-12-20 | Disposition: A | Discharge status: Home / Self Care | Payer: BC Managed Care – PPO | Attending: Emergency Medicine | Admitting: Emergency Medicine

## 2023-12-20 ENCOUNTER — Encounter (HOSPITAL_COMMUNITY): Payer: Self-pay | Admitting: *Deleted

## 2023-12-20 ENCOUNTER — Other Ambulatory Visit: Payer: Self-pay

## 2023-12-20 DIAGNOSIS — Z992 Dependence on renal dialysis: Secondary | ICD-10-CM | POA: Diagnosis not present

## 2023-12-20 DIAGNOSIS — F17290 Nicotine dependence, other tobacco product, uncomplicated: Secondary | ICD-10-CM | POA: Diagnosis not present

## 2023-12-20 DIAGNOSIS — Z7982 Long term (current) use of aspirin: Secondary | ICD-10-CM | POA: Diagnosis not present

## 2023-12-20 DIAGNOSIS — T82838A Hemorrhage of vascular prosthetic devices, implants and grafts, initial encounter: Secondary | ICD-10-CM | POA: Insufficient documentation

## 2023-12-20 DIAGNOSIS — Y828 Other medical devices associated with adverse incidents: Secondary | ICD-10-CM | POA: Insufficient documentation

## 2023-12-20 NOTE — ED Provider Triage Note (Signed)
Emergency Medicine Provider Triage Evaluation Note  Ryan Salazar , a 72 y.o. male  was evaluated in triage.  Pt complains of vascular access problem. Patient states he was dialyzed earlier today and when he was on the way home his site started bleeding. Went back to dialysis and a dressing was placed over the area. Was told to go to the ER if bleeding continued. States he started bleeding through the dressing. Presents for same  Review of Systems  Positive:  Negative:   Physical Exam  BP (!) 149/82 (BP Location: Left Arm)   Pulse (!) 58   Temp 98.9 F (37.2 C) (Oral)   Resp 20   Ht 5\' 11"  (1.803 m)   Wt 89 kg   SpO2 100%   BMI 27.37 kg/m  Gen:   Awake, no distress   Resp:  Normal effort  MSK:   Moves extremities without difficulty  Other:  Pressure dressing placed over the catheter site prior to my evaluation. No active bleeding through the dressing at this time.  Medical Decision Making  Medically screening exam initiated at 5:02 PM.  Appropriate orders placed.  Ryan Salazar was informed that the remainder of the evaluation will be completed by another provider, this initial triage assessment does not replace that evaluation, and the importance of remaining in the ED until their evaluation is complete.     Silva Bandy, PA-C 12/20/23 1704

## 2023-12-20 NOTE — Discharge Instructions (Signed)
You were seen in the emergency department for bleeding from your fistula.  The bleeding is stopped.  Please continue to watch for any recurrence.  Return to the emergency department if any concerns.

## 2023-12-20 NOTE — ED Triage Notes (Signed)
The pt just finished dialysis  his fistula  started bleeding on the way out of dialysis  he went back in and they cleaned it up  on the way home the fistula started bleeding again  currently he has bled through his  bandage and onto his  shirt and coat

## 2023-12-20 NOTE — ED Provider Notes (Signed)
[place] EMERGENCY DEPARTMENT AT [facility] Provider Note   CSN: [id number] Arrival date & time: 12/20/23  1619     History {Add pertinent medical, surgical, social history, OB history to HPI:1} Chief Complaint  Patient presents with   bleeding dialysis fistula    Ryan Salazar is a 72 y.o. male.  He is here for bleeding from his dialysis access.  He said he had dialysis earlier today and it started bleeding after he was outside.  He was unable to get it stopped so he presented here for further evaluation.  No pain no dizziness.  He said this has happened before and usually improves on its own.  The history is provided by the patient.       Home Medications Prior to Admission medications   Medication Sig Start Date End Date Taking? Authorizing Provider  aspirin EC 81 MG EC tablet Take 1 tablet (81 mg total) by mouth daily. 07/24/19   Burnadette Pop, MD  atorvastatin (LIPITOR) 40 MG tablet TAKE 1 TABLET BY MOUTH DAILY AT 6 PM. 02/16/22   Arnette Felts, FNP  calcitRIOL (ROCALTROL) 0.5 MCG capsule Take 0.5 mcg by mouth every Monday, Wednesday, and Friday with hemodialysis.    [provider]  labetalol (NORMODYNE) 300 MG tablet Take 1 tablet (300 mg total) by mouth 2 (two) times daily. Patient taking differently: Take 300 mg by mouth See admin instructions. 300 mg by mouth twice daily EXCEPT days of Dialysis, MWF, Take 300 mg in the evening 01/08/22   Arnette Felts, FNP  lidocaine-prilocaine (EMLA) cream Apply 1 application  topically daily as needed (prior to port access). 11/28/21   [provider]  Misc Natural Products (SAW PALMETTO) CAPS Take 1 capsule by mouth daily.    [provider]  Multiple Vitamins-Minerals (MULTIVITAMIN WITH MINERALS) tablet Take 1 tablet by mouth daily. Patient not taking: Reported on 12/10/2023    [provider]  omeprazole (PRILOSEC) 20 MG capsule Take 20 mg by mouth daily. 07/23/11   [provider]      Allergies    Enalapril    Review of Systems   Review of Systems  Physical Exam Updated Vital Signs BP (!) 149/82 (BP Location: Left Arm)   Pulse (!) 58   Temp 98.9 F (37.2 C) (Oral)   Resp 20   Ht 5\' 11"  (1.803 m)   Wt 89 kg   SpO2 100%   BMI 27.37 kg/m  Physical Exam Vitals and nursing note reviewed.  Constitutional:      General: He is not in acute distress.    Appearance: Normal appearance. He is well-developed.  HENT:     Head: Normocephalic and atraumatic.  Eyes:     Conjunctiva/sclera: Conjunctivae normal.  Cardiovascular:     Rate and Rhythm: Normal rate and regular rhythm.     Heart sounds: No murmur heard. Pulmonary:     Effort: Pulmonary effort is normal. No respiratory distress.     Breath sounds: Normal breath sounds.  Abdominal:     Palpations: Abdomen is soft.     Tenderness: There is no abdominal tenderness.  Musculoskeletal:     Cervical back: Neck supple.     Comments: Fistula right upper arm.  Dressing is removed and there is no active bleeding.  Positive thrill.  Skin:    General: Skin is warm and dry.     Capillary Refill: Capillary refill takes less than 2 seconds.  Neurological:  General: No focal deficit present.     Mental Status: He is alert.     GCS: GCS eye subscore is 4. GCS verbal subscore is 5. GCS motor subscore is 6.     ED Results / Procedures / Treatments   Labs (all labs ordered are listed, but only abnormal results are displayed) Labs Reviewed - No data to display  EKG None  Radiology No results found.  Procedures Procedures  {Document cardiac monitor, telemetry assessment procedure when appropriate:1}  Medications Ordered in ED Medications - No data to display  ED Course/ Medical Decision Making/ A&P   {   Click here for ABCD2, HEART and other calculatorsREFRESH Note before signing :1}                              Medical Decision Making  This patient complains of ***; this involves an  extensive number of treatment Options and is a complaint that carries with it a high risk of complications and morbidity. The differential includes ***  I ordered, reviewed and interpreted labs, which included *** I ordered medication *** and reviewed PMP when indicated. I ordered imaging studies which included *** and I independently    visualized and interpreted imaging which showed *** Additional history obtained from *** Previous records obtained and reviewed *** I consulted *** and discussed lab and imaging findings and discussed disposition.  Cardiac monitoring reviewed, *** Social determinants considered, *** Critical Interventions: ***  After the interventions stated above, I reevaluated the patient and found *** Admission and further testing considered, ***   {Document critical care time when appropriate:1} {Document review of labs and clinical decision tools ie heart score, Chads2Vasc2 etc:1}  {Document your independent review of radiology images, and any outside records:1} {Document your discussion with family members, caretakers, and with consultants:1} {Document social determinants of health affecting pt's care:1} {Document your decision making why or why not admission, treatments were needed:1} Final Clinical Impression(s) / ED Diagnoses Final diagnoses:  None    Rx / DC Orders ED Discharge Orders     None

## 2023-12-29 DIAGNOSIS — Z23 Encounter for immunization: Secondary | ICD-10-CM | POA: Insufficient documentation

## 2023-12-29 DIAGNOSIS — E663 Overweight: Secondary | ICD-10-CM | POA: Insufficient documentation

## 2023-12-29 NOTE — Assessment & Plan Note (Signed)

## 2023-12-30 ENCOUNTER — Encounter (HOSPITAL_COMMUNITY): Payer: Self-pay

## 2023-12-31 ENCOUNTER — Encounter (HOSPITAL_COMMUNITY): Payer: Self-pay | Admitting: *Deleted

## 2023-12-31 ENCOUNTER — Ambulatory Visit (HOSPITAL_COMMUNITY)
Admission: EM | Admit: 2023-12-31 | Discharge: 2023-12-31 | Disposition: A | Discharge status: Home / Self Care | Payer: BC Managed Care – PPO

## 2023-12-31 ENCOUNTER — Ambulatory Visit (INDEPENDENT_AMBULATORY_CARE_PROVIDER_SITE_OTHER): Payer: BC Managed Care – PPO

## 2023-12-31 DIAGNOSIS — J40 Bronchitis, not specified as acute or chronic: Secondary | ICD-10-CM

## 2023-12-31 DIAGNOSIS — R051 Acute cough: Secondary | ICD-10-CM | POA: Diagnosis not present

## 2023-12-31 DIAGNOSIS — R062 Wheezing: Secondary | ICD-10-CM

## 2023-12-31 LAB — POC COVID19/FLU A&B COMBO
Covid Antigen, POC: NEGATIVE
Influenza A Antigen, POC: NEGATIVE
Influenza B Antigen, POC: NEGATIVE

## 2023-12-31 MED ORDER — METHYLPREDNISOLONE ACETATE 80 MG/ML IJ SUSP
INTRAMUSCULAR | Status: AC
  Filled 2023-12-31: qty 1

## 2023-12-31 MED ORDER — METHYLPREDNISOLONE ACETATE 80 MG/ML IJ SUSP: 80.0000 mg | Freq: Once | INTRAMUSCULAR | 0 refills | Status: DC

## 2023-12-31 MED ORDER — COMPACT SPACE CHAMBER DEVI: 0 refills | Status: AC

## 2023-12-31 MED ORDER — IPRATROPIUM-ALBUTEROL 0.5-2.5 (3) MG/3ML IN SOLN
3.0000 mL | Freq: Once | RESPIRATORY_TRACT | Status: AC
  Administered 2023-12-31: 3 mL via RESPIRATORY_TRACT

## 2023-12-31 MED ORDER — METHYLPREDNISOLONE ACETATE 80 MG/ML IJ SUSP
80.0000 mg | Freq: Once | INTRAMUSCULAR | Status: AC
  Administered 2023-12-31: 80 mg via INTRAMUSCULAR

## 2023-12-31 MED ORDER — ALBUTEROL SULFATE HFA 108 (90 BASE) MCG/ACT IN AERS: 2.0000 | INHALATION_SPRAY | RESPIRATORY_TRACT | 0 refills | Status: AC | PRN

## 2023-12-31 MED ORDER — IPRATROPIUM-ALBUTEROL 0.5-2.5 (3) MG/3ML IN SOLN
RESPIRATORY_TRACT | Status: AC
  Filled 2023-12-31: qty 3

## 2023-12-31 NOTE — Discharge Instructions (Addendum)
 COVID and flu testing are negative.  Chest x-ray is negative for pneumonia.  Chest x-ray will also be reviewed by the radiologist.  If the x-ray report is different than what I have said, we will call and update you.  Otherwise the report will be available on the portal.  Given Depo-Medrol  injection, to reduce wheezing and cough.  Added an albuterol  inhaler and spacer, take 2 puffs, every 4 hours, if needed for wheezing or cough.  This appears to be viral bronchitis, not pneumonia.  You do not need antibiotics.  Return here if symptoms do not improve, worsen or if new symptoms occur.

## 2023-12-31 NOTE — ED Triage Notes (Signed)
 Pt states that he has cough, congestion and wheezing since Friday. He took a med for congestion thats ok for patients with high blood pressure.

## 2023-12-31 NOTE — ED Notes (Signed)
Called pharmacy and D/C rx for depo medrol 80mg , pharmacist confirmed rx D/c

## 2023-12-31 NOTE — ED Provider Notes (Addendum)
 [facility]    CSN: [id number] Arrival date & time: 12/31/23  1608      History   Chief Complaint Chief Complaint  Patient presents with   Cough   Nasal Congestion   Wheezing    HPI Ryan Salazar is a 72 y.o. male.   Here with complaint of cough, head congestion, little bit of facial pressure, upper chest congestion, and wheezing.  Symptoms started 2 to 3 days ago.  He started feeling hot yesterday.  He goes to dialysis 3 days a week and they recommended he see his family doctor but he decided to come to urgent care.  He reports that he is doing well with the dialysis and he is in general good health.   Cough Associated symptoms: rhinorrhea and wheezing   Associated symptoms: no chest pain, no chills, no ear pain, no fever, no rash, no shortness of breath and no sore throat   Wheezing Associated symptoms: cough and rhinorrhea   Associated symptoms: no chest pain, no ear pain, no fever, no rash, no shortness of breath and no sore throat     Past Medical History:  Diagnosis Date   Anemia    Arthritis    per patient, never been dx by MD   Blood transfusion 1989   Chronic kidney disease    dialysis on Mon Wed Friday   Dyspnea    on exertion - no oxygen   GERD (gastroesophageal reflux disease)    HLD (hyperlipidemia)    Hypertension    Sleep apnea    Stroke (HCC) 07/2019   no residual   Ulcer 1989   BLEEDING STOMACH ULCER    Patient Active Problem List   Diagnosis Date Noted   COVID-19 vaccine administered 12/29/2023   Overweight with body mass index (BMI) of 27 to 27.9 in adult 12/29/2023   Dependence on renal dialysis (HCC) 12/10/2023   History of stroke 06/04/2023   Benign hypertension with end-stage renal disease (HCC) 06/04/2023   Mixed hyperlipidemia 02/11/2023   S/P bilateral inguinal hernia repair 02/11/2023   Encounter for annual health examination 02/11/2023   Protein calorie malnutrition (HCC) 12/02/2021   Elevated troponin 12/02/2021    ESRD on dialysis (HCC) 12/02/2021   Anemia in chronic kidney disease 11/27/2021   Iron  deficiency anemia, unspecified 11/27/2021   Abnormal glucose 12/07/2019   Tobacco abuse 08/04/2019   Ischemic stroke (HCC) 08/04/2019   CKD (chronic kidney disease) stage 4, GFR 15-29 ml/min (HCC) 07/21/2019   Excessive cerumen in right ear canal 06/11/2019   Right hand pain 06/03/2019   Essential hypertension 12/28/2018   Nocturia 12/28/2018    Past Surgical History:  Procedure Laterality Date   AV FISTULA PLACEMENT Right 05/02/2021   Procedure: RIGHT ARM ARTERIOVENOUS (AV) FISTULA CREATION;  Surgeon: Sheree Penne Bruckner, MD;  Location: [facility] OR;  Service: Vascular;  Laterality: Right;   COLONOSCOPY     INGUINAL HERNIA REPAIR  1973   INGUINAL HERNIA REPAIR Bilateral 12/14/2022   Procedure: LAPAROSCOPIC BILATERAL INGUINAL HERNIA REPAIR WITH MESH;  Surgeon: Lyndel Deward PARAS, MD;  Location: [facility] OR;  Service: General;  Laterality: Bilateral;   INSERTION OF MESH Bilateral 12/14/2022   Procedure: INSERTION OF MESH;  Surgeon: Lyndel Deward PARAS, MD;  Location: [facility] OR;  Service: General;  Laterality: Bilateral;   TONSILLECTOMY  AS CHILD   WISDOM TOOTH EXTRACTION         Home Medications    Prior to Admission medications   Medication Sig  Start Date End Date Taking? Authorizing Provider  albuterol  (VENTOLIN  HFA) 108 (90 Base) MCG/ACT inhaler Inhale 2 puffs into the lungs every 4 (four) hours as needed for wheezing or shortness of breath. 12/31/23 01/30/24 Yes Ival Domino, FNP  aspirin  EC 81 MG EC tablet Take 1 tablet (81 mg total) by mouth daily. 07/24/19  Yes Jillian Buttery, MD  atorvastatin  (LIPITOR) 40 MG tablet TAKE 1 TABLET BY MOUTH DAILY AT 6 PM. 02/16/22  Yes Moore, Janece, FNP  calcitRIOL  (ROCALTROL ) 0.5 MCG capsule Take 0.5 mcg by mouth every Monday, Wednesday, and Friday with hemodialysis.   Yes [provider]  labetalol  (NORMODYNE ) 300 MG tablet Take 1 tablet (300 mg total) by  mouth 2 (two) times daily. Patient taking differently: Take 300 mg by mouth See admin instructions. 300 mg by mouth twice daily EXCEPT days of Dialysis, MWF, Take 300 mg in the evening 01/08/22  Yes Georgina Speaks, FNP  methylPREDNISolone  acetate (DEPO-MEDROL ) 80 MG/ML injection Inject 1 mL (80 mg total) into the muscle once for 1 dose. 12/31/23 12/31/23 Yes Ival Domino, FNP  Misc Natural Products (SAW PALMETTO) CAPS Take 1 capsule by mouth daily.   Yes [provider]  omeprazole  (PRILOSEC) 20 MG capsule Take 20 mg by mouth daily. 07/23/11  Yes [provider]  sevelamer carbonate (RENVELA) 800 MG tablet Take 800 mg by mouth 3 (three) times daily.   Yes [provider]  Spacer/Aero-Holding Chambers (COMPACT SPACE CHAMBER) DEVI Use with albuterol  inhaler 12/31/23  Yes Ival Domino, FNP  lidocaine -prilocaine (EMLA) cream Apply 1 application  topically daily as needed (prior to port access). 11/28/21   [provider]  Multiple Vitamins-Minerals (MULTIVITAMIN WITH MINERALS) tablet Take 1 tablet by mouth daily. Patient not taking: Reported on 12/10/2023    [provider]    Family History Family History  Problem Relation Age of Onset   Stroke Mother    Healthy Father    Colon cancer Neg Hx    Colon polyps Neg Hx     Social History Social History   Tobacco Use   Smoking status: Some Days    Current packs/day: 0.25    Average packs/day: 0.3 packs/day for 50.0 years (12.5 ttl pk-yrs)    Types: Cigarettes   Smokeless tobacco: Never   Tobacco comments:    3 - 4 cigarettes per day states not even a whole cigarette. 01/31/22 - smoking 2-3 cigarettes a day  Vaping Use   Vaping status: Never Used  Substance Use Topics   Alcohol use: Yes    Comment: occasional on weekends   Drug use: Yes    Types: Marijuana    Comment: last use was on 12/08/22     Allergies   Enalapril   Review of Systems Review of Systems  Constitutional:  Negative for  chills and fever.  HENT:  Positive for congestion, rhinorrhea and sinus pressure. Negative for ear pain and sore throat.   Eyes:  Negative for pain and visual disturbance.  Respiratory:  Positive for cough and wheezing. Negative for shortness of breath.   Cardiovascular:  Negative for chest pain and palpitations.  Gastrointestinal:  Negative for abdominal pain and vomiting.  Genitourinary:  Negative for dysuria and hematuria.  Musculoskeletal:  Negative for arthralgias and back pain.  Skin:  Negative for color change and rash.  Neurological:  Negative for seizures and syncope.  All other systems reviewed and are negative.    Physical Exam Triage Vital Signs ED Triage Vitals  Encounter Vitals Group     BP 12/31/23 1755 (!) 165/90     Systolic BP Percentile --      Diastolic BP Percentile --      Pulse Rate 12/31/23 1755 66     Resp 12/31/23 1755 20     Temp 12/31/23 1755 98.1 F (36.7 C)     Temp Source 12/31/23 1755 Oral     SpO2 12/31/23 1755 95 %     Weight --      Height --      Head Circumference --      Peak Flow --      Pain Score 12/31/23 1753 0     Pain Loc --      Pain Education --      Exclude from Growth Chart --    No data found.  Updated Vital Signs BP (!) 165/90 (BP Location: Left Arm)   Pulse 66   Temp 98.1 F (36.7 C) (Oral)   Resp 20   SpO2 95%   Visual Acuity Right Eye Distance:   Left Eye Distance:   Bilateral Distance:    Right Eye Near:   Left Eye Near:    Bilateral Near:     Physical Exam Constitutional:      Appearance: Normal appearance.  HENT:     Head: Normocephalic.     Right Ear: Hearing, tympanic membrane, ear canal and external ear normal.     Left Ear: Hearing, tympanic membrane, ear canal and external ear normal.     Nose: Mucosal edema, congestion and rhinorrhea present. Rhinorrhea is clear.     Right Turbinates: Swollen.     Left Turbinates: Swollen.     Mouth/Throat:     Mouth: Mucous membranes are moist.      Pharynx: Uvula midline. No oropharyngeal exudate or posterior oropharyngeal erythema.     Tonsils: No tonsillar exudate or tonsillar abscesses.  Eyes:     General: Lids are normal. Vision grossly intact.     Conjunctiva/sclera: Conjunctivae normal.     Pupils: Pupils are equal, round, and reactive to light.  Cardiovascular:     Rate and Rhythm: Normal rate and regular rhythm.     Heart sounds: Normal heart sounds, S1 normal and S2 normal. No murmur heard. Pulmonary:     Effort: Pulmonary effort is normal.     Breath sounds: Examination of the right-upper field reveals decreased breath sounds and wheezing. Examination of the left-upper field reveals decreased breath sounds and wheezing. Decreased breath sounds and wheezing present.  Abdominal:     General: Abdomen is flat. Bowel sounds are normal.     Palpations: Abdomen is soft.     Tenderness: There is no abdominal tenderness.  Musculoskeletal:     Cervical back: Normal range of motion and neck supple.  Lymphadenopathy:     Head:     Right side of head: Submandibular and posterior auricular adenopathy present.     Left side of head: Submandibular and posterior auricular adenopathy present.     Cervical: Cervical adenopathy present.     Right cervical: Superficial cervical adenopathy present.     Left cervical: Superficial cervical adenopathy present.  Skin:    General: Skin is warm and dry.     Findings: No rash.  Neurological:     Mental Status: He is alert and oriented to person, place, and time.     Gait: Gait normal.  Psychiatric:  Mood and Affect: Mood normal.        Behavior: Behavior normal.      UC Treatments / Results  Labs (all labs ordered are listed, but only abnormal results are displayed) Labs Reviewed  POC COVID19/FLU A&B COMBO    EKG   Radiology No results found.  Procedures Procedures (including critical care time)  Medications Ordered in UC Medications  methylPREDNISolone  acetate  (DEPO-MEDROL ) injection 80 mg (has no administration in time range)  ipratropium-albuterol  (DUONEB) 0.5-2.5 (3) MG/3ML nebulizer solution 3 mL (3 mLs Nebulization Given 12/31/23 1819)    Initial Impression / Assessment and Plan / UC Course  I have reviewed the triage vital signs and the nursing notes.  Pertinent labs & imaging results that were available during my care of the patient were reviewed by me and considered in my medical decision making (see chart for details).  Bronchitis, wheezing, cough: COVID and flu testing were negative.  Chest x-ray is negative for pneumonia.  Chest x-ray will also be read by radiology.  If report differs from my review, patient will be called.  If report is negative, it will be available for review on the portal.  Educated about bronchitis with handout given.  Educated about how to use an inhaler with handout given.  Prescription for albuterol  inhaler, 2 puffs, every 4 hours as needed for wheezing.  Educated that this is a viral illness and does not need antibiotics.  Correction:  Patient was given a DepoMedrol 80mg  IM injection.  It was sent to his pharmacy but then canceled.  Return here if symptoms do not improve, worsen or if new symptoms occur. Final Clinical Impressions(s) / UC Diagnoses   Final diagnoses:  Acute cough  Wheezing  Bronchitis     Discharge Instructions      COVID and flu testing are negative.  Chest x-ray is negative for pneumonia.  Chest x-ray will also be reviewed by the radiologist.  If the x-ray report is different than what I have said, we will call and update you.  Otherwise the report will be available on the portal.  Given Depo-Medrol  injection, to reduce wheezing and cough.  Added an albuterol  inhaler and spacer, take 2 puffs, every 4 hours, if needed for wheezing or cough.  This appears to be viral bronchitis, not pneumonia.  You do not need antibiotics.  Return here if symptoms do not improve, worsen or if new  symptoms occur.     ED Prescriptions     Medication Sig Dispense Auth. Provider   methylPREDNISolone  acetate (DEPO-MEDROL ) 80 MG/ML injection Inject 1 mL (80 mg total) into the muscle once for 1 dose. 1 mL Ival Domino, FNP   albuterol  (VENTOLIN  HFA) 108 (90 Base) MCG/ACT inhaler Inhale 2 puffs into the lungs every 4 (four) hours as needed for wheezing or shortness of breath. 8.5 g Ival Domino, FNP   Spacer/Aero-Holding Chambers (COMPACT SPACE CHAMBER) DEVI Use with albuterol  inhaler 1 each Ival Domino, FNP      PDMP not reviewed this encounter.   Ival Domino, FNP 12/31/23 1844    Ival Domino, FNP 12/31/23 1911

## 2024-01-02 NOTE — Progress Notes (Signed)
 Chest x-ray is clear, no pneumonia.  Patient was updated during his visit and advised that if the radiology impression was the same as my impression he would not be called.  He is aware he can check the portal for the report if desired.

## 2024-05-19 ENCOUNTER — Encounter (HOSPITAL_COMMUNITY): Payer: Self-pay

## 2024-06-09 ENCOUNTER — Encounter: Payer: Self-pay | Admitting: Nurse Practitioner

## 2024-06-11 ENCOUNTER — Encounter (HOSPITAL_COMMUNITY): Payer: Self-pay

## 2024-06-17 ENCOUNTER — Encounter: Payer: Self-pay | Admitting: Nurse Practitioner

## 2024-06-18 ENCOUNTER — Ambulatory Visit (INDEPENDENT_AMBULATORY_CARE_PROVIDER_SITE_OTHER): Payer: Medicare Other | Admitting: Nurse Practitioner

## 2024-06-18 ENCOUNTER — Encounter: Payer: Self-pay | Admitting: Nurse Practitioner

## 2024-06-18 VITALS — BP 140/80 | HR 75 | Temp 98.7°F | Ht 71.0 in | Wt 193.8 lb

## 2024-06-18 DIAGNOSIS — L609 Nail disorder, unspecified: Secondary | ICD-10-CM

## 2024-06-18 DIAGNOSIS — R7309 Other abnormal glucose: Secondary | ICD-10-CM

## 2024-06-18 DIAGNOSIS — N186 End stage renal disease: Secondary | ICD-10-CM

## 2024-06-18 DIAGNOSIS — I120 Hypertensive chronic kidney disease with stage 5 chronic kidney disease or end stage renal disease: Secondary | ICD-10-CM

## 2024-06-18 DIAGNOSIS — E663 Overweight: Secondary | ICD-10-CM

## 2024-06-18 DIAGNOSIS — Z0000 Encounter for general adult medical examination without abnormal findings: Secondary | ICD-10-CM

## 2024-06-18 DIAGNOSIS — Z79899 Other long term (current) drug therapy: Secondary | ICD-10-CM

## 2024-06-18 DIAGNOSIS — Z6827 Body mass index (BMI) 27.0-27.9, adult: Secondary | ICD-10-CM | POA: Diagnosis not present

## 2024-06-18 DIAGNOSIS — E782 Mixed hyperlipidemia: Secondary | ICD-10-CM

## 2024-06-18 DIAGNOSIS — Z2821 Immunization not carried out because of patient refusal: Secondary | ICD-10-CM

## 2024-06-18 DIAGNOSIS — Z992 Dependence on renal dialysis: Secondary | ICD-10-CM

## 2024-06-18 NOTE — Progress Notes (Signed)
 Ryan Salazar, CMA,acting as a Neurosurgeon for Ryan Ada, FNP.,have documented all relevant documentation on the behalf of Ryan Ada, FNP,as directed by  Ryan Ada, FNP while in the presence of Ryan Ada, FNP.  Subjective:   Patient ID: Ryan Salazar , male    DOB: 20-May-1951 , 73 y.o.   MRN: [id number]  Chief Complaint  Patient presents with   Annual Exam    Patient presents today for HM, Patient reports compliance with medication. Patient denies any chest pain, SOB, or headaches. Patient has no concerns today.     HPI  Here for HM. He is going for labs for renal transplant. He continues to have dialysis on MWF, overall he is doing well. He is just back on amlodipine  since this week given by Dr. Jerrye - he has an elevated blood pressure at the beginning of his dialysis.      Past Medical History:  Diagnosis Date   Anemia    Arthritis    per patient, never been dx by MD   Blood transfusion 1989   Chronic kidney disease    dialysis on Mon Wed Friday   Dyspnea    on exertion - no oxygen   Elevated troponin 12/02/2021   GERD (gastroesophageal reflux disease)    HLD (hyperlipidemia)    Hypertension    Right hand pain 06/03/2019   Sleep apnea    Stroke (HCC) 07/2019   no residual   Ulcer 1989   BLEEDING STOMACH ULCER     Family History  Problem Relation Age of Onset   Stroke Mother    Healthy Father    Colon cancer Neg Hx    Colon polyps Neg Hx      Current Outpatient Medications:    albuterol  (VENTOLIN  HFA) 108 (90 Base) MCG/ACT inhaler, Inhale 2 puffs into the lungs every 4 (four) hours as needed for wheezing or shortness of breath., Disp: 8.5 g, Rfl: 0   amLODipine  (NORVASC ) 5 MG tablet, Take 5 mg by mouth daily., Disp: , Rfl:    aspirin  EC 81 MG EC tablet, Take 1 tablet (81 mg total) by mouth daily., Disp: 30 tablet, Rfl: 1   atorvastatin  (LIPITOR) 40 MG tablet, TAKE 1 TABLET BY MOUTH DAILY AT 6 PM., Disp: 30 tablet, Rfl: 0   calcitRIOL  (ROCALTROL ) 0.5 MCG  capsule, Take 0.5 mcg by mouth every Monday, Wednesday, and Friday with hemodialysis., Disp: , Rfl:    labetalol  (NORMODYNE ) 300 MG tablet, Take 1 tablet (300 mg total) by mouth 2 (two) times daily. (Patient taking differently: Take 300 mg by mouth See admin instructions. 300 mg by mouth twice daily EXCEPT days of Dialysis, MWF, Take 300 mg in the evening), Disp: 90 tablet, Rfl: 1   lidocaine -prilocaine (EMLA) cream, Apply 1 application  topically daily as needed (prior to port access)., Disp: , Rfl:    Misc Natural Products (SAW PALMETTO) CAPS, Take 1 capsule by mouth daily., Disp: , Rfl:    omeprazole (PRILOSEC) 20 MG capsule, Take 20 mg by mouth daily., Disp: , Rfl:    sevelamer carbonate (RENVELA) 800 MG tablet, Take 800 mg by mouth 3 (three) times daily., Disp: , Rfl:    Spacer/Aero-Holding Chambers (COMPACT SPACE CHAMBER) DEVI, Use with albuterol  inhaler, Disp: 1 each, Rfl: 0   Multiple Vitamins-Minerals (MULTIVITAMIN WITH MINERALS) tablet, Take 1 tablet by mouth daily. (Patient not taking: Reported on 06/18/2024), Disp: , Rfl:    Allergies  Allergen Reactions   Enalapril Swelling  Reported by [facility] - pt is not aware     Men's preventive visit. Patient Health Questionnaire (PHQ-2) is  Flowsheet Row Office Visit from 12/10/2023 in [facility]  PHQ-2 Total Score 0  Patient is on a regular/renal diet. Exercise - mowing grass. Has not been riding his bike. He is active. Marital status: Single. Relevant history for alcohol use is:  Social History   Substance and Sexual Activity  Alcohol Use Yes   Comment: occasional on weekends  . Relevant history for tobacco use is:  Social History   Tobacco Use  Smoking Status Some Days   Current packs/day: 0.25   Average packs/day: 0.3 packs/day for 50.0 years (12.5 ttl pk-yrs)   Types: Cigarettes  Smokeless Tobacco Never  Tobacco Comments   3 - 4 cigarettes per day states not even a whole cigarette.  01/31/22 - smoking 2-3 cigarettes a day  .   Review of Systems  Constitutional: Negative.   HENT: Negative.    Eyes: Negative.   Respiratory: Negative.    Cardiovascular: Negative.   Gastrointestinal: Negative.   Endocrine: Negative.   Genitourinary: Negative.   Musculoskeletal: Negative.   Skin: Negative.   Allergic/Immunologic: Negative.   Hematological: Negative.   Psychiatric/Behavioral: Negative.       Today's Vitals   06/18/24 1520 06/18/24 1618  BP: (!) 150/80 (!) 140/80  Pulse: 75   Temp: 98.7 F (37.1 C)   TempSrc: Oral   Weight: 193 lb 12.8 oz (87.9 kg)   Height: 5' 11 (1.803 m)   PainSc: 0-No pain    Body mass index is 27.03 kg/m.  Wt Readings from Last 3 Encounters:  06/18/24 193 lb 12.8 oz (87.9 kg)  12/20/23 196 lb 3.4 oz (89 kg)  12/10/23 196 lb 3.2 oz (89 kg)    Objective:  Physical Exam Vitals and nursing note reviewed.  Constitutional:      General: He is not in acute distress.    Appearance: Normal appearance.  HENT:     Head: Normocephalic and atraumatic.     Right Ear: Tympanic membrane, ear canal and external ear normal. There is no impacted cerumen (excess cerumen not impacted).     Left Ear: Tympanic membrane, ear canal and external ear normal. There is no impacted cerumen (excess cerumen not impacted).     Nose: Nose normal.     Mouth/Throat:     Mouth: Mucous membranes are moist.   Eyes:     Pupils: Pupils are equal, round, and reactive to light.    Cardiovascular:     Rate and Rhythm: Normal rate and regular rhythm.     Pulses: Normal pulses.     Heart sounds: Normal heart sounds. No murmur heard. Pulmonary:     Effort: Pulmonary effort is normal. No respiratory distress.     Breath sounds: Normal breath sounds.  Abdominal:     General: Abdomen is flat. Bowel sounds are normal. There is no distension.     Palpations: Abdomen is soft.  Genitourinary:    Comments: NA  Musculoskeletal:        General: Normal range of motion.      Cervical back: Normal range of motion and neck supple.   Skin:    General: Skin is warm.     Capillary Refill: Capillary refill takes less than 2 seconds.   Neurological:     General: No focal deficit present.     Mental Status: He is alert and  oriented to person, place, and time.     Cranial Nerves: No cranial nerve deficit.     Motor: No weakness.   Psychiatric:        Mood and Affect: Mood normal.        Behavior: Behavior normal.        Thought Content: Thought content normal.        Judgment: Judgment normal.         Assessment And Plan:    Encounter for annual health examination Assessment & Plan: Behavior modifications discussed and diet history reviewed.   Pt will continue to exercise regularly and modify diet with low GI, plant based foods and decrease intake of processed foods.  Recommend intake of daily multivitamin, Vitamin D , and calcium .  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 EKG done with non specific abnormality HR 78  Orders: -     EKG 12-Lead -     CMP14+EGFR  Dependence on renal dialysis [facility]) Assessment & Plan: Continue dialysis MWF.    Mixed hyperlipidemia Assessment & Plan: Cholesterol levels are stable. Continue statin, tolerating well.   Orders: -     Lipid panel  Abnormal glucose Assessment & Plan: HgbA1c is stable, continue focusing on healthy diet.   Orders: -     Hemoglobin A1c  Nail abnormality -     VITAMIN D  25 Hydroxy (Vit-D Deficiency, Fractures)  COVID-19 vaccination declined Assessment & Plan: Declines covid 19 vaccine. Discussed risk of covid 76 and if he changes her mind about the vaccine to  call the office. Education has been provided regarding the importance of this vaccine but patient still declined. Advised may receive this vaccine at local pharmacy or Health Dept.or vaccine clinic. Aware to provide a copy of the vaccination record if obtained from local pharmacy or Health Dept.  Encouraged to take multivitamin, vitamin d , vitamin c and zinc to increase immune system. Aware can call office if would like to have vaccine here at office. Verbalized acceptance and understanding.     Overweight with body mass index (BMI) of 27 to 27.9 in adult -     Hemoglobin A1c  Other long term (current) drug therapy -     CBC with Differential/Platelet     Return for 6 month bp check, 1 year physical: needs Awv. Patient was given opportunity to ask questions. Patient verbalized understanding of the plan and was able to repeat key elements of the plan. All questions were answered to their satisfaction.   Ryan Ada, FNP  I, Ryan Ada, FNP, have reviewed all documentation for this visit. The documentation on 06/18/24 for the exam, diagnosis, procedures, and orders are all accurate and complete.

## 2024-06-19 LAB — CMP14+EGFR
ALT: 18 IU/L (ref 0–44)
AST: 27 IU/L (ref 0–40)
Albumin: 4.7 g/dL (ref 3.8–4.8)
Alkaline Phosphatase: 67 IU/L (ref 44–121)
BUN/Creatinine Ratio: 4 — ABNORMAL LOW (ref 10–24)
BUN: 30 mg/dL — ABNORMAL HIGH (ref 8–27)
Bilirubin Total: 0.6 mg/dL (ref 0.0–1.2)
CO2: 27 mmol/L (ref 20–29)
Calcium: 9 mg/dL (ref 8.6–10.2)
Chloride: 96 mmol/L (ref 96–106)
Creatinine, Ser: 7.98 mg/dL — ABNORMAL HIGH (ref 0.76–1.27)
Globulin, Total: 2.5 g/dL (ref 1.5–4.5)
Glucose: 79 mg/dL (ref 70–99)
Potassium: 4.4 mmol/L (ref 3.5–5.2)
Sodium: 145 mmol/L — ABNORMAL HIGH (ref 134–144)
Total Protein: 7.2 g/dL (ref 6.0–8.5)
eGFR: 7 mL/min/{1.73_m2} — ABNORMAL LOW (ref >59)

## 2024-06-19 LAB — CBC WITH DIFFERENTIAL/PLATELET
Basophils Absolute: 0.1 10*3/uL (ref 0.0–0.2)
Basos: 1 %
EOS (ABSOLUTE): 0.4 10*3/uL (ref 0.0–0.4)
Eos: 6 %
Hematocrit: 38.1 % (ref 37.5–51.0)
Hemoglobin: 12.2 g/dL — ABNORMAL LOW (ref 13.0–17.7)
Immature Grans (Abs): 0 10*3/uL (ref 0.0–0.1)
Immature Granulocytes: 0 %
Lymphocytes Absolute: 1.2 10*3/uL (ref 0.7–3.1)
Lymphs: 19 %
MCH: 28.6 pg (ref 26.6–33.0)
MCHC: 32 g/dL (ref 31.5–35.7)
MCV: 89 fL (ref 79–97)
Monocytes Absolute: 0.8 10*3/uL (ref 0.1–0.9)
Monocytes: 12 %
Neutrophils Absolute: 3.9 10*3/uL (ref 1.4–7.0)
Neutrophils: 62 %
Platelets: 179 10*3/uL (ref 150–450)
RBC: 4.27 x10E6/uL (ref 4.14–5.80)
RDW: 13.7 % (ref 11.6–15.4)
WBC: 6.3 10*3/uL (ref 3.4–10.8)

## 2024-06-19 LAB — LIPID PANEL
Chol/HDL Ratio: 2.6 ratio (ref 0.0–5.0)
Cholesterol, Total: 101 mg/dL (ref 100–199)
HDL: 39 mg/dL — ABNORMAL LOW (ref >39)
LDL Chol Calc (NIH): 46 mg/dL (ref 0–99)
Triglycerides: 77 mg/dL (ref 0–149)
VLDL Cholesterol Cal: 16 mg/dL (ref 5–40)

## 2024-06-19 LAB — HEMOGLOBIN A1C
Est. average glucose Bld gHb Est-mCnc: 117 mg/dL
Hgb A1c MFr Bld: 5.7 % — ABNORMAL HIGH (ref 4.8–5.6)

## 2024-06-19 LAB — VITAMIN D 25 HYDROXY (VIT D DEFICIENCY, FRACTURES): Vit D, 25-Hydroxy: 41.4 ng/mL (ref 30.0–100.0)

## 2024-06-28 ENCOUNTER — Ambulatory Visit: Payer: Self-pay | Admitting: Nurse Practitioner

## 2024-06-28 ENCOUNTER — Encounter: Payer: Self-pay | Admitting: Nurse Practitioner

## 2024-06-28 DIAGNOSIS — Z2821 Immunization not carried out because of patient refusal: Secondary | ICD-10-CM | POA: Insufficient documentation

## 2024-06-28 DIAGNOSIS — L609 Nail disorder, unspecified: Secondary | ICD-10-CM | POA: Insufficient documentation

## 2024-06-28 NOTE — Assessment & Plan Note (Signed)
 HgbA1c is stable, continue focusing on healthy diet.

## 2024-06-28 NOTE — Assessment & Plan Note (Signed)

## 2024-06-28 NOTE — Assessment & Plan Note (Addendum)
 Blood pressure is slightly elevated, repeat is better, his nephrologist increased his blood pressure medication at last visit, continue current medications. EKG done with non specific abnormality HR 78

## 2024-06-28 NOTE — Assessment & Plan Note (Signed)
 Behavior modifications discussed and diet history reviewed.   Pt will continue to exercise regularly and modify diet with low GI, plant based foods and decrease intake of processed foods.  Recommend intake of daily multivitamin, Vitamin D, and calcium.  Recommend colonoscopy for preventive screenings, as well as recommend immunizations that include influenza, TDAP, and Shingles

## 2024-06-28 NOTE — Assessment & Plan Note (Signed)
Continue dialysis MWF.

## 2024-06-28 NOTE — Assessment & Plan Note (Signed)
Cholesterol levels are stable.  Continue statin, tolerating well.

## 2024-06-29 NOTE — Addendum Note (Signed)
 Addended by: GEORGINA SPEAKS F on: 06/29/2024 05:06 PM   Modules accepted: Level of Service

## 2024-07-21 ENCOUNTER — Other Ambulatory Visit: Payer: Self-pay

## 2024-07-21 MED ORDER — ATORVASTATIN CALCIUM 40 MG PO TABS: 40.0000 mg | ORAL_TABLET | Freq: Every day | ORAL | 0 refills | Status: DC

## 2024-08-13 ENCOUNTER — Other Ambulatory Visit: Payer: Self-pay | Admitting: Nurse Practitioner

## 2024-09-01 ENCOUNTER — Encounter: Payer: Self-pay | Admitting: Nurse Practitioner

## 2024-09-02 ENCOUNTER — Other Ambulatory Visit: Payer: Self-pay

## 2024-09-02 MED ORDER — OMEPRAZOLE 20 MG PO CPDR: 20.0000 mg | DELAYED_RELEASE_CAPSULE | Freq: Every day | ORAL | 1 refills | Status: DC

## 2024-09-26 ENCOUNTER — Encounter (HOSPITAL_COMMUNITY): Payer: Self-pay

## 2024-10-19 ENCOUNTER — Encounter (HOSPITAL_COMMUNITY): Payer: Self-pay

## 2024-10-26 ENCOUNTER — Encounter: Payer: Self-pay | Admitting: Surgery

## 2024-10-26 ENCOUNTER — Ambulatory Visit: Payer: Self-pay | Attending: Surgery | Admitting: Surgery

## 2024-10-26 ENCOUNTER — Telehealth: Payer: Self-pay

## 2024-10-26 ENCOUNTER — Encounter (HOSPITAL_COMMUNITY): Payer: Self-pay

## 2024-10-26 VITALS — BP 145/93 | HR 81 | Temp 98.1°F | Ht 71.0 in | Wt 200.0 lb

## 2024-10-26 DIAGNOSIS — Z992 Dependence on renal dialysis: Secondary | ICD-10-CM | POA: Diagnosis not present

## 2024-10-26 DIAGNOSIS — N186 End stage renal disease: Secondary | ICD-10-CM | POA: Insufficient documentation

## 2024-10-26 NOTE — Telephone Encounter (Signed)
 Request for fistulagram faxed to [facility] on 10/27.

## 2024-10-26 NOTE — Progress Notes (Signed)
 [facility]  Patient name: Ryan Salazar MRN: [id number] DOB: 27-Dec-1951 Sex: male   REQUESTING PROVIDER:   Dr. Jerrye   REASON FOR CONSULT:    Fistula ulcer  HISTORY OF PRESENT ILLNESS:   Ryan Salazar is a 73 y.o. male, who is status post right brachiocephalic fistula by Dr. Sheree in 2022.  He states that he will occasionally have prolonged bleeding.  He also occasionally has swelling in his right hand.  He tells me that he has undergone 3 fistulogram's by Dr. Norine.  PAST MEDICAL HISTORY    Past Medical History:  Diagnosis Date   Anemia    Arthritis    per patient, never been dx by MD   Blood transfusion 1989   Chronic kidney disease    dialysis on Mon Wed Friday   Dyspnea    on exertion - no oxygen   Elevated troponin 12/02/2021   GERD (gastroesophageal reflux disease)    HLD (hyperlipidemia)    Hypertension    Right hand pain 06/03/2019   Sleep apnea    Stroke (HCC) 07/2019   no residual   Ulcer 1989   BLEEDING STOMACH ULCER     FAMILY HISTORY   Family History  Problem Relation Age of Onset   Stroke Mother    Healthy Father    Colon cancer Neg Hx    Colon polyps Neg Hx     SOCIAL HISTORY:   Social History   Socioeconomic History   Marital status: Single    Spouse name: Not on file   Number of children: Not on file   Years of education: Not on file   Highest education level: Not on file  Occupational History   Not on file  Tobacco Use   Smoking status: Some Days    Current packs/day: 0.25    Average packs/day: 0.3 packs/day for 50.0 years (12.5 ttl pk-yrs)    Types: Cigarettes   Smokeless tobacco: Never   Tobacco comments:    3 - 4 cigarettes per day states not even a whole cigarette. 01/31/22 - smoking 2-3 cigarettes a day  Vaping Use   Vaping status: Never Used  Substance and Sexual Activity   Alcohol use: Yes    Comment: occasional on weekends   Drug  use: Yes    Types: Marijuana    Comment: last use was on 12/08/22   Sexual activity: Not Currently  Other Topics Concern   Not on file  Social History Narrative   Not on file   Social Drivers of Health   Financial Resource Strain: Not on file  Food Insecurity: Not on file  Transportation Needs: Not on file  Physical Activity: Not on file  Stress: Not on file  Social Connections: Not on file  Intimate Partner Violence: Not on file    ALLERGIES:    Allergies  Allergen Reactions   Enalapril Swelling    Reported by [facility] - pt is not aware    CURRENT MEDICATIONS:    Current Outpatient Medications  Medication Sig Dispense Refill   albuterol  (VENTOLIN  HFA) 108 (90 Base) MCG/ACT inhaler Inhale 2 puffs into the lungs every 4 (four) hours as needed for wheezing or shortness of breath. 8.5 g 0   amLODipine  (NORVASC ) 5 MG tablet Take 5 mg by mouth daily.     aspirin  EC 81 MG EC tablet Take 1 tablet (81 mg total) by mouth daily. 30 tablet 1   atorvastatin  (LIPITOR)  40 MG tablet TAKE 1 TABLET BY MOUTH EVERY DAY 90 tablet 1   calcitRIOL  (ROCALTROL ) 0.5 MCG capsule Take 0.5 mcg by mouth every Monday, Wednesday, and Friday with hemodialysis.     labetalol  (NORMODYNE ) 300 MG tablet Take 1 tablet (300 mg total) by mouth 2 (two) times daily. (Patient taking differently: Take 300 mg by mouth See admin instructions. 300 mg by mouth twice daily EXCEPT days of Dialysis, MWF, Take 300 mg in the evening) 90 tablet 1   lidocaine -prilocaine (EMLA) cream Apply 1 application  topically daily as needed (prior to port access).     Misc Natural Products (SAW PALMETTO) CAPS Take 1 capsule by mouth daily.     Multiple Vitamins-Minerals (MULTIVITAMIN WITH MINERALS) tablet Take 1 tablet by mouth daily.     omeprazole  (PRILOSEC) 20 MG capsule Take 1 capsule (20 mg total) by mouth daily. 90 capsule 1   sevelamer carbonate (RENVELA) 800 MG tablet Take 800 mg by mouth 3 (three) times daily.      Spacer/Aero-Holding Chambers (COMPACT SPACE CHAMBER) DEVI Use with albuterol  inhaler 1 each 0   No current facility-administered medications for this visit.    REVIEW OF SYSTEMS:   [X]  denotes positive finding, [ ]  denotes negative finding Cardiac  Comments:  Chest pain or chest pressure:    Shortness of breath upon exertion:    Short of breath when lying flat:    Irregular heart rhythm:        Vascular    Pain in calf, thigh, or hip brought on by ambulation:    Pain in feet at night that wakes you up from your sleep:     Blood clot in your veins:    Leg swelling:         Pulmonary    Oxygen at home:    Productive cough:     Wheezing:         Neurologic    Sudden weakness in arms or legs:     Sudden numbness in arms or legs:     Sudden onset of difficulty speaking or slurred speech:    Temporary loss of vision in one eye:     Problems with dizziness:         Gastrointestinal    Blood in stool:      Vomited blood:         Genitourinary    Burning when urinating:     Blood in urine:        Psychiatric    Major depression:         Hematologic    Bleeding problems:    Problems with blood clotting too easily:        Skin    Rashes or ulcers:        Constitutional    Fever or chills:     PHYSICAL EXAM:   Vitals:   10/26/24 0840  BP: (!) 145/93  Pulse: 81  Temp: 98.1 F (36.7 C)  SpO2: 95%  Weight: 200 lb (90.7 kg)  Height: 5' 11 (1.803 m)    GENERAL: The patient is a well-nourished male, in no acute distress. The vital signs are documented above. CARDIAC: There is a regular rate and rhythm.  VASCULAR: Good thrill within right upper arm fistula which is ectatic.  No obvious ulceration PULMONARY: Nonlabored respirations MUSCULOSKELETAL: There are no major deformities or cyanosis. NEUROLOGIC: No focal weakness or paresthesias are detected. PSYCHIATRIC: The patient has a normal affect.  STUDIES:  ASSESSMENT and PLAN   ESRD: The patient has  prolonged bleeding and mild swelling.  He has undergone 3 fistulogram's in the past by Dr. Norine, however I do not have access to these images.  I feel based off of his symptoms that we need to proceed with a fistulogram to better evaluate his fistula and intervene if necessary.  This will be scheduled on a nondialysis day   Malvina Serene CLORE, MD, FACS [facility] [phone] Pager [phone]

## 2024-10-26 NOTE — H&P (View-Only) (Signed)
 [facility]  Patient name: Ryan Salazar MRN: [id number] DOB: 27-Dec-1951 Sex: male   REQUESTING PROVIDER:   Dr. Jerrye   REASON FOR CONSULT:    Fistula ulcer  HISTORY OF PRESENT ILLNESS:   Ryan Salazar is a 73 y.o. male, who is status post right brachiocephalic fistula by Dr. Sheree in 2022.  He states that he will occasionally have prolonged bleeding.  He also occasionally has swelling in his right hand.  He tells me that he has undergone 3 fistulogram's by Dr. Norine.  PAST MEDICAL HISTORY    Past Medical History:  Diagnosis Date   Anemia    Arthritis    per patient, never been dx by MD   Blood transfusion 1989   Chronic kidney disease    dialysis on Mon Wed Friday   Dyspnea    on exertion - no oxygen   Elevated troponin 12/02/2021   GERD (gastroesophageal reflux disease)    HLD (hyperlipidemia)    Hypertension    Right hand pain 06/03/2019   Sleep apnea    Stroke (HCC) 07/2019   no residual   Ulcer 1989   BLEEDING STOMACH ULCER     FAMILY HISTORY   Family History  Problem Relation Age of Onset   Stroke Mother    Healthy Father    Colon cancer Neg Hx    Colon polyps Neg Hx     SOCIAL HISTORY:   Social History   Socioeconomic History   Marital status: Single    Spouse name: Not on file   Number of children: Not on file   Years of education: Not on file   Highest education level: Not on file  Occupational History   Not on file  Tobacco Use   Smoking status: Some Days    Current packs/day: 0.25    Average packs/day: 0.3 packs/day for 50.0 years (12.5 ttl pk-yrs)    Types: Cigarettes   Smokeless tobacco: Never   Tobacco comments:    3 - 4 cigarettes per day states not even a whole cigarette. 01/31/22 - smoking 2-3 cigarettes a day  Vaping Use   Vaping status: Never Used  Substance and Sexual Activity   Alcohol use: Yes    Comment: occasional on weekends   Drug  use: Yes    Types: Marijuana    Comment: last use was on 12/08/22   Sexual activity: Not Currently  Other Topics Concern   Not on file  Social History Narrative   Not on file   Social Drivers of Health   Financial Resource Strain: Not on file  Food Insecurity: Not on file  Transportation Needs: Not on file  Physical Activity: Not on file  Stress: Not on file  Social Connections: Not on file  Intimate Partner Violence: Not on file    ALLERGIES:    Allergies  Allergen Reactions   Enalapril Swelling    Reported by [facility] - pt is not aware    CURRENT MEDICATIONS:    Current Outpatient Medications  Medication Sig Dispense Refill   albuterol  (VENTOLIN  HFA) 108 (90 Base) MCG/ACT inhaler Inhale 2 puffs into the lungs every 4 (four) hours as needed for wheezing or shortness of breath. 8.5 g 0   amLODipine  (NORVASC ) 5 MG tablet Take 5 mg by mouth daily.     aspirin  EC 81 MG EC tablet Take 1 tablet (81 mg total) by mouth daily. 30 tablet 1   atorvastatin  (LIPITOR)  40 MG tablet TAKE 1 TABLET BY MOUTH EVERY DAY 90 tablet 1   calcitRIOL  (ROCALTROL ) 0.5 MCG capsule Take 0.5 mcg by mouth every Monday, Wednesday, and Friday with hemodialysis.     labetalol  (NORMODYNE ) 300 MG tablet Take 1 tablet (300 mg total) by mouth 2 (two) times daily. (Patient taking differently: Take 300 mg by mouth See admin instructions. 300 mg by mouth twice daily EXCEPT days of Dialysis, MWF, Take 300 mg in the evening) 90 tablet 1   lidocaine -prilocaine (EMLA) cream Apply 1 application  topically daily as needed (prior to port access).     Misc Natural Products (SAW PALMETTO) CAPS Take 1 capsule by mouth daily.     Multiple Vitamins-Minerals (MULTIVITAMIN WITH MINERALS) tablet Take 1 tablet by mouth daily.     omeprazole  (PRILOSEC) 20 MG capsule Take 1 capsule (20 mg total) by mouth daily. 90 capsule 1   sevelamer carbonate (RENVELA) 800 MG tablet Take 800 mg by mouth 3 (three) times daily.      Spacer/Aero-Holding Chambers (COMPACT SPACE CHAMBER) DEVI Use with albuterol  inhaler 1 each 0   No current facility-administered medications for this visit.    REVIEW OF SYSTEMS:   [X]  denotes positive finding, [ ]  denotes negative finding Cardiac  Comments:  Chest pain or chest pressure:    Shortness of breath upon exertion:    Short of breath when lying flat:    Irregular heart rhythm:        Vascular    Pain in calf, thigh, or hip brought on by ambulation:    Pain in feet at night that wakes you up from your sleep:     Blood clot in your veins:    Leg swelling:         Pulmonary    Oxygen at home:    Productive cough:     Wheezing:         Neurologic    Sudden weakness in arms or legs:     Sudden numbness in arms or legs:     Sudden onset of difficulty speaking or slurred speech:    Temporary loss of vision in one eye:     Problems with dizziness:         Gastrointestinal    Blood in stool:      Vomited blood:         Genitourinary    Burning when urinating:     Blood in urine:        Psychiatric    Major depression:         Hematologic    Bleeding problems:    Problems with blood clotting too easily:        Skin    Rashes or ulcers:        Constitutional    Fever or chills:     PHYSICAL EXAM:   Vitals:   10/26/24 0840  BP: (!) 145/93  Pulse: 81  Temp: 98.1 F (36.7 C)  SpO2: 95%  Weight: 200 lb (90.7 kg)  Height: 5' 11 (1.803 m)    GENERAL: The patient is a well-nourished male, in no acute distress. The vital signs are documented above. CARDIAC: There is a regular rate and rhythm.  VASCULAR: Good thrill within right upper arm fistula which is ectatic.  No obvious ulceration PULMONARY: Nonlabored respirations MUSCULOSKELETAL: There are no major deformities or cyanosis. NEUROLOGIC: No focal weakness or paresthesias are detected. PSYCHIATRIC: The patient has a normal affect.  STUDIES:  ASSESSMENT and PLAN   ESRD: The patient has  prolonged bleeding and mild swelling.  He has undergone 3 fistulogram's in the past by Dr. Norine, however I do not have access to these images.  I feel based off of his symptoms that we need to proceed with a fistulogram to better evaluate his fistula and intervene if necessary.  This will be scheduled on a nondialysis day   Malvina Serene CLORE, MD, FACS [facility] [phone] Pager [phone]

## 2024-11-09 ENCOUNTER — Encounter (HOSPITAL_COMMUNITY): Payer: Self-pay

## 2024-11-11 ENCOUNTER — Encounter (HOSPITAL_COMMUNITY): Payer: Self-pay | Admitting: Surgery

## 2024-11-12 ENCOUNTER — Encounter (HOSPITAL_COMMUNITY): Admission: RE | Disposition: A | Payer: Self-pay | Source: Home / Self Care | Attending: Surgery

## 2024-11-12 ENCOUNTER — Other Ambulatory Visit: Payer: Self-pay

## 2024-11-12 ENCOUNTER — Ambulatory Visit (HOSPITAL_COMMUNITY)
Admission: RE | Admit: 2024-11-12 | Discharge: 2024-11-12 | Disposition: A | Discharge status: Home / Self Care | Attending: Surgery | Admitting: Surgery

## 2024-11-12 DIAGNOSIS — Z79899 Other long term (current) drug therapy: Secondary | ICD-10-CM | POA: Insufficient documentation

## 2024-11-12 DIAGNOSIS — Z91041 Radiographic dye allergy status: Secondary | ICD-10-CM | POA: Insufficient documentation

## 2024-11-12 DIAGNOSIS — T82858A Stenosis of vascular prosthetic devices, implants and grafts, initial encounter: Secondary | ICD-10-CM | POA: Diagnosis not present

## 2024-11-12 DIAGNOSIS — F17210 Nicotine dependence, cigarettes, uncomplicated: Secondary | ICD-10-CM | POA: Diagnosis not present

## 2024-11-12 DIAGNOSIS — I120 Hypertensive chronic kidney disease with stage 5 chronic kidney disease or end stage renal disease: Secondary | ICD-10-CM | POA: Diagnosis not present

## 2024-11-12 DIAGNOSIS — L299 Pruritus, unspecified: Secondary | ICD-10-CM | POA: Diagnosis not present

## 2024-11-12 DIAGNOSIS — Z992 Dependence on renal dialysis: Secondary | ICD-10-CM

## 2024-11-12 DIAGNOSIS — N186 End stage renal disease: Secondary | ICD-10-CM

## 2024-11-12 DIAGNOSIS — Y832 Surgical operation with anastomosis, bypass or graft as the cause of abnormal reaction of the patient, or of later complication, without mention of misadventure at the time of the procedure: Secondary | ICD-10-CM | POA: Diagnosis not present

## 2024-11-12 HISTORY — PX: VENOUS ANGIOPLASTY: CATH118376

## 2024-11-12 HISTORY — PX: A/V FISTULAGRAM: CATH118298

## 2024-11-12 SURGERY — A/V FISTULAGRAM
Anesthesia: LOCAL | Site: Arm Upper | Laterality: Right

## 2024-11-12 MED ORDER — METHYLPREDNISOLONE SODIUM SUCC 125 MG IJ SOLR
INTRAMUSCULAR | Status: DC | PRN
  Administered 2024-11-12: 125 mg via INTRAVENOUS

## 2024-11-12 MED ORDER — IODIXANOL 320 MG/ML IV SOLN
INTRAVENOUS | Status: DC | PRN
  Administered 2024-11-12: 40 mL via INTRAVENOUS

## 2024-11-12 MED ORDER — LIDOCAINE HCL (PF) 1 % IJ SOLN
INTRAMUSCULAR | Status: AC
  Filled 2024-11-12: qty 30

## 2024-11-12 MED ORDER — DIPHENHYDRAMINE HCL 50 MG/ML IJ SOLN
INTRAMUSCULAR | Status: DC | PRN
  Administered 2024-11-12: 25 mg via INTRAVENOUS

## 2024-11-12 MED ORDER — LIDOCAINE HCL (PF) 1 % IJ SOLN
INTRAMUSCULAR | Status: DC | PRN
  Administered 2024-11-12: 5 mL

## 2024-11-12 MED ORDER — HEPARIN (PORCINE) IN NACL 1000-0.9 UT/500ML-% IV SOLN
INTRAVENOUS | Status: DC | PRN
Start: 2024-11-12 — End: 2024-11-12
  Administered 2024-11-12: 500 mL

## 2024-11-12 MED ORDER — DIPHENHYDRAMINE HCL 50 MG/ML IJ SOLN
INTRAMUSCULAR | Status: AC
  Filled 2024-11-12: qty 1

## 2024-11-12 MED ORDER — METHYLPREDNISOLONE SODIUM SUCC 125 MG IJ SOLR
INTRAMUSCULAR | Status: AC
  Filled 2024-11-12: qty 2

## 2024-11-12 SURGICAL SUPPLY — 10 items
BALLOON MUSTANG 10.0X40 75 (BALLOONS) IMPLANT
BALLOON MUSTANG 8X100X75 (BALLOONS) IMPLANT
CATH SLIP KMP 65CM 5FR (CATHETERS) IMPLANT
DEVICE INFLATION ENCORE 26 (MISCELLANEOUS) IMPLANT
GLIDEWIRE ADV .035X180CM (WIRE) IMPLANT
KIT MICROPUNCTURE NIT STIFF (SHEATH) IMPLANT
SHEATH PINNACLE R/O II 6F 4CM (SHEATH) IMPLANT
STOPCOCK MORSE 400PSI 3WAY (MISCELLANEOUS) IMPLANT
TRAY PV CATH (CUSTOM PROCEDURE TRAY) ×2 IMPLANT
TUBING CIL FLEX 10 FLL-RA (TUBING) IMPLANT

## 2024-11-12 NOTE — Interval H&P Note (Signed)
 History and Physical Interval Note:  11/12/2024 10:09 AM  Ryan Salazar Ill  has presented today for surgery, with the diagnosis of N18.6.  The various methods of treatment have been discussed with the patient and family. After consideration of risks, benefits and other options for treatment, the patient has consented to  Procedure(s): A/V Fistulagram (Right) as a surgical intervention.  The patient's history has been reviewed, patient examined, no change in status, stable for surgery.  I have reviewed the patient's chart and labs.  Questions were answered to the patient's satisfaction.     [name]

## 2024-11-12 NOTE — Op Note (Signed)
    Patient name: Ryan Salazar MRN: [id number] DOB: 23-Jun-1951 Sex: male  11/12/2024 Pre-operative Diagnosis: ESRD Post-operative diagnosis:  Same Surgeon:  [name] Procedure Performed:  1.  Ultrasound-guided access, right cephalic vein  2.  Fistulogram  3.  Venoplasty, right cephalic vein (peripheral)  4.  Venoplasty, right innominate/subclavian vein (central)    Indications: This is a 73 year old with end-stage renal disease who is having trouble with his access.  He comes in today for further evaluation.  Procedure:  The patient was identified in the holding area and taken to room 8.  The patient was then placed supine on the table and prepped and draped in the usual sterile fashion.  A time out was called. Ultrasound was used to evaluate the fistula.  The vein was patent and compressible.  A digital ultrasound image was acquired.  The fistula was then accessed under ultrasound guidance using a micropuncture needle.  An 018 wire was then asvanced without resistance and a micropuncture sheath was placed.  Contrast injections were then performed through the sheath.  Findings: The arteriovenous anastomosis is widely patent.  The fistula is patent in the upper arm however at the cephalic vein junction, there is approximately 80% stenosis.  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    Intervention:  After the above images were acquired the decision was made to proceed with intervention.  Over a Glidewire advantage a 6 French sheath was placed.  I first treated the peripheral lesion using a 10 x 100 balloon.  The central lesion was treated with a 10 x 40 balloon.  Full effacement of the balloon was achieved in both lesions.  Completion imaging showed improved results with residual stenosis less than 20%.  Catheters and wires were removed.  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 [name], M.D., [facility] Office: [phone] Pager:  [phone]

## 2024-11-13 ENCOUNTER — Encounter (HOSPITAL_COMMUNITY): Payer: Self-pay | Admitting: Surgery

## 2024-12-09 ENCOUNTER — Telehealth: Admitting: Nurse Practitioner

## 2024-12-09 ENCOUNTER — Encounter: Payer: Self-pay | Admitting: Nurse Practitioner

## 2024-12-09 VITALS — BP 162/91

## 2024-12-09 DIAGNOSIS — N186 End stage renal disease: Secondary | ICD-10-CM

## 2024-12-09 DIAGNOSIS — R7309 Other abnormal glucose: Secondary | ICD-10-CM

## 2024-12-09 DIAGNOSIS — I120 Hypertensive chronic kidney disease with stage 5 chronic kidney disease or end stage renal disease: Secondary | ICD-10-CM

## 2024-12-09 DIAGNOSIS — Z992 Dependence on renal dialysis: Secondary | ICD-10-CM

## 2024-12-09 DIAGNOSIS — E782 Mixed hyperlipidemia: Secondary | ICD-10-CM

## 2024-12-09 MED ORDER — OMEPRAZOLE 20 MG PO CPDR: 20.0000 mg | DELAYED_RELEASE_CAPSULE | Freq: Every day | ORAL | 1 refills | Status: AC

## 2024-12-09 NOTE — Assessment & Plan Note (Signed)
 Blood pressure is slightly elevated, repeat is better, his nephrologist increased his blood pressure medication at last visit, continue current medications. EKG done with non specific abnormality HR 78

## 2024-12-09 NOTE — Progress Notes (Unsigned)
 Virtual Visit via Video Note  LILLETTE Kristeen JINNY Gladis, CMA,acting as a scribe for Ryan Ada, FNP.,have documented all relevant documentation on the behalf of Ryan Ada, FNP,as directed by  Ryan Ada, FNP while in the presence of Ryan Ada, FNP.  I connected with Oneil VEAR Ill on 12/09/24 at 12:20 PM EST by a video enabled telemedicine application and verified that I am speaking with the correct person using two identifiers.  Patient Location: Home - he is at dialysis center Provider Location: Home Office  I discussed the limitations, risks, security, and privacy concerns of performing an evaluation and management service by video and the availability of in person appointments. I also discussed with the patient that there may be a patient responsible charge related to this service. The patient expressed understanding and agreed to proceed.  Subjective: PCP: Salazar Gaines, FNP  Chief Complaint  Patient presents with   Hypertension    Patient presents today for a bp and pre dm follow up, Patient reports compliance with medication. Patient denies any chest pain, SOB, or headaches. Patient has no concerns today.    He is currently in dialysis during the visit.   Hypertension This is a chronic problem. The current episode started more than 1 year ago. The problem has been gradually worsening (currently in the dialysis chair) since onset. Pertinent negatives include no anxiety or peripheral edema. There are no associated agents to hypertension. Risk factors for coronary artery disease include male gender, smoking/tobacco exposure and sedentary lifestyle. Past treatments include calcium  channel blockers and beta blockers. There are no compliance problems.  Hypertensive end-organ damage includes kidney disease (ESRD on dialysis). Identifiable causes of hypertension include chronic renal disease.     ROS: Per HPI  Current Outpatient Medications:    albuterol  (VENTOLIN  HFA) 108 (90 Base)  MCG/ACT inhaler, Inhale 2 puffs into the lungs every 4 (four) hours as needed for wheezing or shortness of breath., Disp: 8.5 g, Rfl: 0   amLODipine  (NORVASC ) 5 MG tablet, Take 5 mg by mouth daily., Disp: , Rfl:    aspirin  EC 81 MG EC tablet, Take 1 tablet (81 mg total) by mouth daily., Disp: 30 tablet, Rfl: 1   atorvastatin  (LIPITOR) 40 MG tablet, TAKE 1 TABLET BY MOUTH EVERY DAY, Disp: 90 tablet, Rfl: 1   calcitRIOL  (ROCALTROL ) 0.5 MCG capsule, Take 0.5 mcg by mouth every Monday, Wednesday, and Friday with hemodialysis., Disp: , Rfl:    labetalol  (NORMODYNE ) 300 MG tablet, Take 1 tablet (300 mg total) by mouth 2 (two) times daily. (Patient taking differently: Take 300 mg by mouth See admin instructions. 300 mg by mouth twice daily EXCEPT days of Dialysis, MWF, Take 300 mg in the evening), Disp: 90 tablet, Rfl: 1   lidocaine -prilocaine (EMLA) cream, Apply 1 application  topically daily as needed (prior to port access)., Disp: , Rfl:    Misc Natural Products (SAW PALMETTO) CAPS, Take 1 capsule by mouth daily., Disp: , Rfl:    Multiple Vitamins-Minerals (MULTIVITAMIN WITH MINERALS) tablet, Take 1 tablet by mouth daily., Disp: , Rfl:    sevelamer carbonate (RENVELA) 800 MG tablet, Take 800 mg by mouth 3 (three) times daily., Disp: , Rfl:    Spacer/Aero-Holding Chambers (COMPACT SPACE CHAMBER) DEVI, Use with albuterol  inhaler, Disp: 1 each, Rfl: 0   omeprazole  (PRILOSEC) 20 MG capsule, Take 1 capsule (20 mg total) by mouth daily., Disp: 90 capsule, Rfl: 1  Observations/Objective: Today's Vitals   12/09/24 1214  BP: (!) 162/91  Physical Exam Vitals reviewed.  Constitutional:      General: He is not in acute distress.    Appearance: Normal appearance. He is normal weight.  Pulmonary:     Effort: Pulmonary effort is normal. No respiratory distress.  Neurological:     General: No focal deficit present.     Mental Status: He is alert and oriented to person, place, and time. Mental status is at  baseline.     Cranial Nerves: No cranial nerve deficit.  Psychiatric:        Mood and Affect: Mood normal.        Behavior: Behavior normal.        Thought Content: Thought content normal.        Judgment: Judgment normal.     Assessment and Plan: Benign hypertension with end-stage renal disease (HCC) Assessment & Plan: Blood pressure is slightly elevated, repeat is better, his nephrologist increased his blood pressure medication at last visit, continue current medications. EKG done with non specific abnormality HR 78  Orders: -     BMP8+eGFR; Future  Mixed hyperlipidemia Assessment & Plan: Cholesterol levels are stable. Continue statin, tolerating well.   Orders: -     Lipid panel; Future  Abnormal glucose Assessment & Plan: HgbA1c is stable, continue focusing on healthy diet.   Orders: -     Hemoglobin A1c; Future  ESRD on dialysis [facility])  Other orders -     Omeprazole ; Take 1 capsule (20 mg total) by mouth daily.  Dispense: 90 capsule; Refill: 1    Follow Up Instructions: No follow-ups on file.   I discussed the assessment and treatment plan with the patient. The patient was provided an opportunity to ask questions, and all were answered. The patient agreed with the plan and demonstrated an understanding of the instructions.   The patient was advised to call back or seek an in-person evaluation if the symptoms worsen or if the condition fails to improve as anticipated.  The above assessment and management plan was discussed with the patient. The patient verbalized understanding of and has agreed to the management plan.   LILLETTE Ryan Ada, FNP, have reviewed all documentation for this visit. The documentation on 12/09/24 for the exam, diagnosis, procedures, and orders are all accurate and complete.

## 2024-12-09 NOTE — Assessment & Plan Note (Signed)
 HgbA1c is stable, continue focusing on healthy diet.

## 2024-12-09 NOTE — Assessment & Plan Note (Signed)
Cholesterol levels are stable.  Continue statin, tolerating well.

## 2024-12-10 ENCOUNTER — Other Ambulatory Visit

## 2024-12-10 LAB — LIPID PANEL
Chol/HDL Ratio: 2.6 ratio (ref 0.0–5.0)
Cholesterol, Total: 94 mg/dL — ABNORMAL LOW (ref 100–199)
HDL: 36 mg/dL — ABNORMAL LOW (ref >39)
LDL Chol Calc (NIH): 34 mg/dL (ref 0–99)
Triglycerides: 135 mg/dL (ref 0–149)
VLDL Cholesterol Cal: 24 mg/dL (ref 5–40)

## 2024-12-10 LAB — HEMOGLOBIN A1C
Est. average glucose Bld gHb Est-mCnc: 126 mg/dL
Hgb A1c MFr Bld: 6 % — ABNORMAL HIGH (ref 4.8–5.6)

## 2024-12-10 LAB — BMP8+EGFR
BUN/Creatinine Ratio: 3 — ABNORMAL LOW (ref 10–24)
BUN: 26 mg/dL (ref 8–27)
CO2: 28 mmol/L (ref 20–29)
Calcium: 9 mg/dL (ref 8.6–10.2)
Chloride: 95 mmol/L — ABNORMAL LOW (ref 96–106)
Creatinine, Ser: 7.59 mg/dL — ABNORMAL HIGH (ref 0.76–1.27)
Glucose: 86 mg/dL (ref 70–99)
Potassium: 4.4 mmol/L (ref 3.5–5.2)
Sodium: 142 mmol/L (ref 134–144)
eGFR: 7 mL/min/1.73 — ABNORMAL LOW (ref >59)

## 2024-12-16 ENCOUNTER — Ambulatory Visit: Payer: Self-pay | Admitting: Nurse Practitioner

## 2024-12-21 ENCOUNTER — Ambulatory Visit: Payer: Self-pay | Admitting: Nurse Practitioner

## 2025-01-13 ENCOUNTER — Ambulatory Visit

## 2025-02-05 ENCOUNTER — Other Ambulatory Visit: Payer: Self-pay | Admitting: Nurse Practitioner

## 2025-03-10 ENCOUNTER — Ambulatory Visit: Payer: Self-pay

## 2025-06-21 ENCOUNTER — Encounter: Payer: Self-pay | Admitting: Nurse Practitioner
